# Patient Record
Sex: Male | Born: 1950
Health system: Southern US, Community
[De-identification: ages and names within clinical notes are randomized; demographics above are authoritative.]

## PROBLEM LIST (undated history)

## (undated) DIAGNOSIS — E1165 Type 2 diabetes mellitus with hyperglycemia: Secondary | ICD-10-CM

## (undated) DIAGNOSIS — E119 Type 2 diabetes mellitus without complications: Secondary | ICD-10-CM

## (undated) DIAGNOSIS — F32A Depression, unspecified: Secondary | ICD-10-CM

## (undated) DIAGNOSIS — G250 Essential tremor: Secondary | ICD-10-CM

## (undated) DIAGNOSIS — K219 Gastro-esophageal reflux disease without esophagitis: Secondary | ICD-10-CM

## (undated) DIAGNOSIS — I2510 Atherosclerotic heart disease of native coronary artery without angina pectoris: Secondary | ICD-10-CM

## (undated) DIAGNOSIS — E669 Obesity, unspecified: Secondary | ICD-10-CM

## (undated) DIAGNOSIS — F329 Major depressive disorder, single episode, unspecified: Secondary | ICD-10-CM

## (undated) DIAGNOSIS — E039 Hypothyroidism, unspecified: Secondary | ICD-10-CM

## (undated) DIAGNOSIS — I10 Essential (primary) hypertension: Secondary | ICD-10-CM

## (undated) DIAGNOSIS — E079 Disorder of thyroid, unspecified: Secondary | ICD-10-CM

## (undated) HISTORY — PX: TONSILLECTOMY: SUR1361

## (undated) HISTORY — DX: Major depressive disorder, single episode, unspecified: F32.9

## (undated) HISTORY — DX: Obesity, unspecified: E66.9

## (undated) HISTORY — DX: Gastro-esophageal reflux disease without esophagitis: K21.9

## (undated) HISTORY — DX: Essential tremor: G25.0

## (undated) HISTORY — DX: Depression, unspecified: F32.A

## (undated) HISTORY — DX: Hypothyroidism, unspecified: E03.9

## (undated) HISTORY — DX: Type 2 diabetes mellitus with hyperglycemia: E11.65

## (undated) HISTORY — PX: COLONOSCOPY: SHX174

## (undated) HISTORY — DX: Atherosclerotic heart disease of native coronary artery without angina pectoris: I25.10

## (undated) HISTORY — PX: APPENDECTOMY: SHX54

---

## 2010-12-04 ENCOUNTER — Inpatient Hospital Stay (INDEPENDENT_AMBULATORY_CARE_PROVIDER_SITE_OTHER)
Admission: RE | Admit: 2010-12-04 | Discharge: 2010-12-04 | Disposition: A | Discharge status: Home / Self Care | Payer: 59 | Source: Ambulatory Visit | Attending: Family Medicine | Admitting: Family Medicine

## 2010-12-04 DIAGNOSIS — E876 Hypokalemia: Secondary | ICD-10-CM

## 2010-12-04 LAB — POCT I-STAT, CHEM 8
BUN: 14 mg/dL (ref 6–23)
Chloride: 95 mEq/L — ABNORMAL LOW (ref 96–112)
Creatinine, Ser: 1.5 mg/dL (ref 0.4–1.5)
Glucose, Bld: 131 mg/dL — ABNORMAL HIGH (ref 70–99)
Potassium: 3 mEq/L — ABNORMAL LOW (ref 3.5–5.1)
Sodium: 134 mEq/L — ABNORMAL LOW (ref 135–145)

## 2012-08-22 ENCOUNTER — Encounter (INDEPENDENT_AMBULATORY_CARE_PROVIDER_SITE_OTHER): Payer: Self-pay | Admitting: *Deleted

## 2014-10-19 ENCOUNTER — Encounter: Payer: Self-pay | Admitting: Pulmonary Disease

## 2015-08-16 MED FILL — JANUVIA 100 MG TABLET: 100 | 90 days supply | Qty: 90 | Fill #3

## 2015-09-16 DIAGNOSIS — I10 Essential (primary) hypertension: Secondary | ICD-10-CM | POA: Diagnosis not present

## 2015-09-16 DIAGNOSIS — M545 Low back pain: Secondary | ICD-10-CM | POA: Diagnosis not present

## 2015-09-16 DIAGNOSIS — E119 Type 2 diabetes mellitus without complications: Secondary | ICD-10-CM | POA: Diagnosis not present

## 2015-09-21 MED FILL — LEVOTHYROXINE 112 MCG TAB: 112 | 90 days supply | Qty: 90 | Fill #1

## 2015-09-21 MED FILL — LISINOPRIL-HCTZ 20-12.5 MG: 20-12.5 | 90 days supply | Qty: 180 | Fill #1

## 2015-09-21 MED FILL — metFORMIN HCL 500 MG TABS: 500 | 90 days supply | Qty: 180 | Fill #1

## 2015-09-21 MED FILL — PARoxetine HCL 20 MG TABS: 20 | 90 days supply | Qty: 90 | Fill #1

## 2015-10-25 DIAGNOSIS — Z1211 Encounter for screening for malignant neoplasm of colon: Secondary | ICD-10-CM | POA: Diagnosis not present

## 2015-10-25 DIAGNOSIS — M545 Low back pain: Secondary | ICD-10-CM | POA: Diagnosis not present

## 2015-10-25 DIAGNOSIS — E119 Type 2 diabetes mellitus without complications: Secondary | ICD-10-CM | POA: Diagnosis not present

## 2015-10-25 DIAGNOSIS — Z0000 Encounter for general adult medical examination without abnormal findings: Secondary | ICD-10-CM | POA: Diagnosis not present

## 2015-10-25 DIAGNOSIS — I10 Essential (primary) hypertension: Secondary | ICD-10-CM | POA: Diagnosis not present

## 2015-12-06 MED FILL — JANUMET 50-1,000 MG TABLET: 50-1000 | 90 days supply | Qty: 180 | Fill #0

## 2015-12-22 MED FILL — LISINOPRIL-HCTZ 20-12.5 MG: 20-12.5 | 90 days supply | Qty: 180 | Fill #2

## 2015-12-22 MED FILL — LEVOTHYROXINE 112 MCG TAB: 112 | 90 days supply | Qty: 90 | Fill #2

## 2015-12-22 MED FILL — PARoxetine HCL 20 MG TABS: 20 | 90 days supply | Qty: 90 | Fill #2

## 2016-02-21 DIAGNOSIS — F329 Major depressive disorder, single episode, unspecified: Secondary | ICD-10-CM | POA: Diagnosis not present

## 2016-02-21 DIAGNOSIS — E1165 Type 2 diabetes mellitus with hyperglycemia: Secondary | ICD-10-CM | POA: Diagnosis not present

## 2016-02-21 DIAGNOSIS — E039 Hypothyroidism, unspecified: Secondary | ICD-10-CM | POA: Diagnosis not present

## 2016-02-21 DIAGNOSIS — I10 Essential (primary) hypertension: Secondary | ICD-10-CM | POA: Diagnosis not present

## 2016-02-22 DIAGNOSIS — E1165 Type 2 diabetes mellitus with hyperglycemia: Secondary | ICD-10-CM | POA: Diagnosis not present

## 2016-02-22 DIAGNOSIS — E039 Hypothyroidism, unspecified: Secondary | ICD-10-CM | POA: Diagnosis not present

## 2016-02-22 DIAGNOSIS — I10 Essential (primary) hypertension: Secondary | ICD-10-CM | POA: Diagnosis not present

## 2016-02-22 DIAGNOSIS — F329 Major depressive disorder, single episode, unspecified: Secondary | ICD-10-CM | POA: Diagnosis not present

## 2016-03-08 MED FILL — JANUMET 50-1,000 MG TABLET: 50-1000 | 90 days supply | Qty: 180 | Fill #1

## 2016-03-28 MED FILL — PARoxetine HCL 20 MG TABS: 20 | 90 days supply | Qty: 90 | Fill #3

## 2016-03-28 MED FILL — LEVOTHYROXINE 112 MCG TAB: 112 | 90 days supply | Qty: 90 | Fill #3

## 2016-03-28 MED FILL — LISINOPRIL-HCTZ 20-12.5 MG: 20-12.5 | 90 days supply | Qty: 180 | Fill #3

## 2016-05-12 ENCOUNTER — Telehealth (INDEPENDENT_AMBULATORY_CARE_PROVIDER_SITE_OTHER): Payer: Self-pay | Admitting: Internal Medicine

## 2016-05-12 MED ORDER — AZITHROMYCIN 250 MG PO TABS: ORAL_TABLET | ORAL | 0 refills | Status: DC

## 2016-05-12 NOTE — Telephone Encounter (Signed)
Rx for Z pack sent to his pharmacy

## 2016-05-17 DIAGNOSIS — J329 Chronic sinusitis, unspecified: Secondary | ICD-10-CM | POA: Diagnosis not present

## 2016-05-17 NOTE — Telephone Encounter (Signed)
error 

## 2016-05-23 DIAGNOSIS — Z23 Encounter for immunization: Secondary | ICD-10-CM | POA: Diagnosis not present

## 2016-05-23 DIAGNOSIS — E1165 Type 2 diabetes mellitus with hyperglycemia: Secondary | ICD-10-CM | POA: Diagnosis not present

## 2016-05-23 DIAGNOSIS — I10 Essential (primary) hypertension: Secondary | ICD-10-CM | POA: Diagnosis not present

## 2016-05-23 DIAGNOSIS — F321 Major depressive disorder, single episode, moderate: Secondary | ICD-10-CM | POA: Diagnosis not present

## 2016-06-06 MED FILL — JANUMET 50-1,000 MG TABLET: 50-1000 | 90 days supply | Qty: 180 | Fill #2

## 2016-06-08 MED FILL — TRUEplus LANCETS 30G MISC: 90 days supply | Qty: 300 | Fill #0

## 2016-06-28 MED FILL — LISINOPRIL-HCTZ 20-12.5 MG: 20-12.5 | 90 days supply | Qty: 180 | Fill #0

## 2016-06-28 MED FILL — TRUE METRIX GLUCOSE TEST ST: 90 days supply | Qty: 300 | Fill #0

## 2016-06-28 MED FILL — LEVOTHYROXINE 112 MCG TAB: 112 | 90 days supply | Qty: 90 | Fill #0

## 2016-06-28 MED FILL — PARoxetine HCL 20 MG TABS: 20 | 90 days supply | Qty: 90 | Fill #0

## 2016-07-04 ENCOUNTER — Encounter: Payer: Self-pay | Admitting: Pulmonary Disease

## 2016-07-04 DIAGNOSIS — E1165 Type 2 diabetes mellitus with hyperglycemia: Secondary | ICD-10-CM | POA: Diagnosis not present

## 2016-07-04 DIAGNOSIS — F321 Major depressive disorder, single episode, moderate: Secondary | ICD-10-CM | POA: Diagnosis not present

## 2016-07-04 DIAGNOSIS — I10 Essential (primary) hypertension: Secondary | ICD-10-CM | POA: Diagnosis not present

## 2016-09-14 MED FILL — JANUMET 50-1,000 MG TABLET: 50-1000 | 30 days supply | Qty: 60 | Fill #3

## 2016-09-26 MED FILL — LISINOPRIL-HCTZ 20-12.5 MG: 20-12.5 | 90 days supply | Qty: 180 | Fill #1

## 2016-09-26 MED FILL — PARoxetine HCL 20 MG TABS: 20 | 90 days supply | Qty: 90 | Fill #1

## 2016-09-26 MED FILL — LEVOTHYROXINE 112 MCG TAB: 112 | 90 days supply | Qty: 90 | Fill #1

## 2016-10-16 MED FILL — JANUMET 50-1,000 MG TABLET: 50-1000 | 30 days supply | Qty: 60 | Fill #4

## 2016-11-13 MED FILL — JANUMET 50-1,000 MG TABLET: 50-1000 | 30 days supply | Qty: 60 | Fill #0

## 2016-11-15 MED FILL — ACCU-CHEK FASTCLIX LANCETS: 90 days supply | Qty: 204 | Fill #0

## 2016-11-15 MED FILL — ACCU-CHEK GUIDE TEST STRIP: 90 days supply | Qty: 200 | Fill #0

## 2016-11-22 DIAGNOSIS — H5213 Myopia, bilateral: Secondary | ICD-10-CM | POA: Diagnosis not present

## 2016-11-22 DIAGNOSIS — H524 Presbyopia: Secondary | ICD-10-CM | POA: Diagnosis not present

## 2016-11-22 DIAGNOSIS — H52203 Unspecified astigmatism, bilateral: Secondary | ICD-10-CM | POA: Diagnosis not present

## 2016-11-22 DIAGNOSIS — E119 Type 2 diabetes mellitus without complications: Secondary | ICD-10-CM | POA: Diagnosis not present

## 2016-12-15 MED FILL — JANUMET 50-1,000 MG TABLET: 50-1000 | 30 days supply | Qty: 60 | Fill #1

## 2016-12-21 MED FILL — LEVOTHYROXINE 112 MCG TAB: 112 | 90 days supply | Qty: 90 | Fill #2

## 2016-12-21 MED FILL — PARoxetine HCL 20 MG TABS: 20 | 90 days supply | Qty: 90 | Fill #2

## 2016-12-21 MED FILL — LISINOPRIL-HCTZ 20-12.5 MG: 20-12.5 | 90 days supply | Qty: 180 | Fill #2

## 2017-01-16 MED FILL — JANUMET 50-1,000 MG TABLET: 50-1000 | 30 days supply | Qty: 60 | Fill #2

## 2017-02-13 MED FILL — JANUMET 50-1,000 MG TABLET: 50-1000 | 30 days supply | Qty: 60 | Fill #3

## 2017-03-21 MED FILL — JANUMET 50-1,000 MG TABLET: 50-1000 | 30 days supply | Qty: 60 | Fill #4

## 2017-03-27 MED FILL — LEVOTHYROXINE 112 MCG TAB: 112 | 90 days supply | Qty: 90 | Fill #3

## 2017-03-27 MED FILL — LISINOPRIL-HCTZ 20-12.5 MG: 20-12.5 | 90 days supply | Qty: 180 | Fill #3

## 2017-03-27 MED FILL — PARoxetine HCL 20 MG TABS: 20 | 90 days supply | Qty: 90 | Fill #3

## 2017-04-19 MED FILL — JANUMET 50-1,000 MG TABLET: 50-1000 | 30 days supply | Qty: 60 | Fill #5

## 2017-05-23 MED FILL — JANUMET 50-1,000 MG TABLET: 50-1000 | 30 days supply | Qty: 60 | Fill #6

## 2017-06-19 MED FILL — LEVOTHYROXINE 112 MCG TAB: 112 | 90 days supply | Qty: 90 | Fill #0

## 2017-06-19 MED FILL — LISINOPRIL-HCTZ 20-12.5 MG: 20-12.5 | 90 days supply | Qty: 180 | Fill #0

## 2017-06-19 MED FILL — PARoxetine HCL 20 MG TABS: 20 | 90 days supply | Qty: 90 | Fill #0

## 2017-06-27 MED FILL — JANUMET 50-1,000 MG TABLET: 50-1000 | 30 days supply | Qty: 60 | Fill #7

## 2017-07-20 DIAGNOSIS — Z23 Encounter for immunization: Secondary | ICD-10-CM | POA: Diagnosis not present

## 2017-07-20 DIAGNOSIS — F321 Major depressive disorder, single episode, moderate: Secondary | ICD-10-CM | POA: Diagnosis not present

## 2017-07-20 DIAGNOSIS — E119 Type 2 diabetes mellitus without complications: Secondary | ICD-10-CM | POA: Diagnosis not present

## 2017-07-20 DIAGNOSIS — E669 Obesity, unspecified: Secondary | ICD-10-CM | POA: Diagnosis not present

## 2017-07-20 DIAGNOSIS — I10 Essential (primary) hypertension: Secondary | ICD-10-CM | POA: Diagnosis not present

## 2017-07-25 MED FILL — JANUMET 50-1,000 MG TABLET: 50-1000 | 30 days supply | Qty: 60 | Fill #8

## 2017-08-28 MED FILL — JANUMET 50-1,000 MG TABLET: 50-1000 | 30 days supply | Qty: 60 | Fill #9

## 2017-09-16 ENCOUNTER — Emergency Department (HOSPITAL_COMMUNITY): Payer: 59

## 2017-09-16 ENCOUNTER — Encounter (HOSPITAL_COMMUNITY): Payer: Self-pay | Admitting: Emergency Medicine

## 2017-09-16 ENCOUNTER — Observation Stay (HOSPITAL_COMMUNITY)
Admission: EM | Admit: 2017-09-16 | Discharge: 2017-09-18 | Disposition: A | Discharge status: Home / Self Care | Payer: 59 | Attending: Internal Medicine | Admitting: Internal Medicine

## 2017-09-16 DIAGNOSIS — T1590XA Foreign body on external eye, part unspecified, unspecified eye, initial encounter: Secondary | ICD-10-CM

## 2017-09-16 DIAGNOSIS — Z7984 Long term (current) use of oral hypoglycemic drugs: Secondary | ICD-10-CM | POA: Diagnosis not present

## 2017-09-16 DIAGNOSIS — E1122 Type 2 diabetes mellitus with diabetic chronic kidney disease: Secondary | ICD-10-CM | POA: Diagnosis not present

## 2017-09-16 DIAGNOSIS — E119 Type 2 diabetes mellitus without complications: Secondary | ICD-10-CM | POA: Diagnosis present

## 2017-09-16 DIAGNOSIS — E669 Obesity, unspecified: Secondary | ICD-10-CM

## 2017-09-16 DIAGNOSIS — E1169 Type 2 diabetes mellitus with other specified complication: Secondary | ICD-10-CM | POA: Diagnosis present

## 2017-09-16 DIAGNOSIS — E785 Hyperlipidemia, unspecified: Secondary | ICD-10-CM | POA: Insufficient documentation

## 2017-09-16 DIAGNOSIS — I10 Essential (primary) hypertension: Secondary | ICD-10-CM | POA: Diagnosis not present

## 2017-09-16 DIAGNOSIS — Z6830 Body mass index (BMI) 30.0-30.9, adult: Secondary | ICD-10-CM | POA: Diagnosis not present

## 2017-09-16 DIAGNOSIS — Z8249 Family history of ischemic heart disease and other diseases of the circulatory system: Secondary | ICD-10-CM | POA: Insufficient documentation

## 2017-09-16 DIAGNOSIS — Z7982 Long term (current) use of aspirin: Secondary | ICD-10-CM | POA: Insufficient documentation

## 2017-09-16 DIAGNOSIS — R200 Anesthesia of skin: Secondary | ICD-10-CM | POA: Diagnosis not present

## 2017-09-16 DIAGNOSIS — R079 Chest pain, unspecified: Secondary | ICD-10-CM | POA: Diagnosis not present

## 2017-09-16 DIAGNOSIS — E039 Hypothyroidism, unspecified: Secondary | ICD-10-CM | POA: Insufficient documentation

## 2017-09-16 DIAGNOSIS — I129 Hypertensive chronic kidney disease with stage 1 through stage 4 chronic kidney disease, or unspecified chronic kidney disease: Secondary | ICD-10-CM | POA: Insufficient documentation

## 2017-09-16 DIAGNOSIS — Z79899 Other long term (current) drug therapy: Secondary | ICD-10-CM | POA: Insufficient documentation

## 2017-09-16 DIAGNOSIS — N182 Chronic kidney disease, stage 2 (mild): Secondary | ICD-10-CM | POA: Insufficient documentation

## 2017-09-16 DIAGNOSIS — Z79890 Hormone replacement therapy (postmenopausal): Secondary | ICD-10-CM | POA: Diagnosis not present

## 2017-09-16 DIAGNOSIS — E1165 Type 2 diabetes mellitus with hyperglycemia: Secondary | ICD-10-CM | POA: Diagnosis present

## 2017-09-16 DIAGNOSIS — R0789 Other chest pain: Secondary | ICD-10-CM | POA: Diagnosis not present

## 2017-09-16 HISTORY — DX: Type 2 diabetes mellitus without complications: E11.9

## 2017-09-16 HISTORY — DX: Essential (primary) hypertension: I10

## 2017-09-16 HISTORY — DX: Disorder of thyroid, unspecified: E07.9

## 2017-09-16 LAB — I-STAT TROPONIN, ED: TROPONIN I, POC: 0 ng/mL (ref 0.00–0.08)

## 2017-09-16 LAB — CBC
HEMATOCRIT: 43 % (ref 39.0–52.0)
HEMOGLOBIN: 15.1 g/dL (ref 13.0–17.0)
MCH: 29.8 pg (ref 26.0–34.0)
MCHC: 35.1 g/dL (ref 30.0–36.0)
MCV: 85 fL (ref 78.0–100.0)
Platelets: 218 10*3/uL (ref 150–400)
RBC: 5.06 MIL/uL (ref 4.22–5.81)
RDW: 13.2 % (ref 11.5–15.5)
WBC: 8.9 10*3/uL (ref 4.0–10.5)

## 2017-09-16 LAB — BASIC METABOLIC PANEL
ANION GAP: 11 (ref 5–15)
BUN: 19 mg/dL (ref 6–20)
CO2: 22 mmol/L (ref 22–32)
Calcium: 9.1 mg/dL (ref 8.9–10.3)
Chloride: 101 mmol/L (ref 101–111)
Creatinine, Ser: 1.31 mg/dL — ABNORMAL HIGH (ref 0.61–1.24)
GFR calc non Af Amer: 56 mL/min — ABNORMAL LOW (ref >60)
GLUCOSE: 180 mg/dL — AB (ref 65–99)
Potassium: 3.6 mmol/L (ref 3.5–5.1)
Sodium: 134 mmol/L — ABNORMAL LOW (ref 135–145)

## 2017-09-16 MED ORDER — LISINOPRIL 20 MG PO TABS
20.0000 mg | ORAL_TABLET | Freq: Every day | ORAL | Status: DC
  Filled 2017-09-16: qty 1

## 2017-09-16 MED ORDER — LISINOPRIL-HYDROCHLOROTHIAZIDE 20-12.5 MG PO TABS: 1.0000 | ORAL_TABLET | Freq: Every day | ORAL | Status: DC

## 2017-09-16 MED ORDER — ASPIRIN 81 MG PO CHEW
324.0000 mg | CHEWABLE_TABLET | Freq: Once | ORAL | Status: AC
  Administered 2017-09-16: 324 mg via ORAL
  Filled 2017-09-16: qty 4

## 2017-09-16 MED ORDER — HYDROCHLOROTHIAZIDE 12.5 MG PO CAPS
12.5000 mg | ORAL_CAPSULE | Freq: Every day | ORAL | Status: DC
Start: 2017-09-17 — End: 2017-09-17

## 2017-09-16 MED ORDER — INSULIN ASPART 100 UNIT/ML ~~LOC~~ SOLN
0.0000 [IU] | Freq: Three times a day (TID) | SUBCUTANEOUS | Status: DC
  Administered 2017-09-17: 2 [IU] via SUBCUTANEOUS

## 2017-09-16 MED ORDER — ENOXAPARIN SODIUM 40 MG/0.4ML ~~LOC~~ SOLN
40.0000 mg | SUBCUTANEOUS | Status: DC
  Administered 2017-09-17 – 2017-09-18 (×2): 40 mg via SUBCUTANEOUS
  Filled 2017-09-16 (×2): qty 0.4

## 2017-09-16 MED ORDER — NITROGLYCERIN 0.4 MG SL SUBL: 0.4000 mg | SUBLINGUAL_TABLET | SUBLINGUAL | Status: DC | PRN

## 2017-09-16 MED ORDER — LEVOTHYROXINE SODIUM 112 MCG PO TABS
112.0000 ug | ORAL_TABLET | Freq: Every day | ORAL | Status: DC
  Administered 2017-09-17 – 2017-09-18 (×2): 112 ug via ORAL
  Filled 2017-09-16 (×3): qty 1

## 2017-09-16 MED ORDER — ASPIRIN EC 81 MG PO TBEC: 81.0000 mg | DELAYED_RELEASE_TABLET | Freq: Every day | ORAL | Status: DC

## 2017-09-16 MED ORDER — ASPIRIN EC 81 MG PO TBEC
81.0000 mg | DELAYED_RELEASE_TABLET | Freq: Every evening | ORAL | Status: DC
  Administered 2017-09-17 – 2017-09-18 (×2): 81 mg via ORAL
  Filled 2017-09-16 (×2): qty 1

## 2017-09-16 MED ORDER — ACETAMINOPHEN 650 MG RE SUPP: 650.0000 mg | Freq: Four times a day (QID) | RECTAL | Status: DC | PRN

## 2017-09-16 MED ORDER — ONDANSETRON HCL 4 MG PO TABS: 4.0000 mg | ORAL_TABLET | Freq: Four times a day (QID) | ORAL | Status: DC | PRN

## 2017-09-16 MED ORDER — ONDANSETRON HCL 4 MG/2ML IJ SOLN
4.0000 mg | Freq: Four times a day (QID) | INTRAMUSCULAR | Status: DC | PRN
  Administered 2017-09-18: 4 mg via INTRAVENOUS
  Filled 2017-09-16: qty 2

## 2017-09-16 MED ORDER — PAROXETINE HCL 20 MG PO TABS
20.0000 mg | ORAL_TABLET | Freq: Every day | ORAL | Status: DC
  Administered 2017-09-17 – 2017-09-18 (×2): 20 mg via ORAL
  Filled 2017-09-16 (×2): qty 1

## 2017-09-16 MED ORDER — ACETAMINOPHEN 325 MG PO TABS
650.0000 mg | ORAL_TABLET | Freq: Four times a day (QID) | ORAL | Status: DC | PRN
  Administered 2017-09-17 – 2017-09-18 (×3): 650 mg via ORAL
  Filled 2017-09-16 (×3): qty 2

## 2017-09-16 NOTE — H&P (Addendum)
History and Physical    LAMONTA Salazar VQQ:[id number] DOB: 19-Mar-1951 DOA: 09/16/2017  PCP: Sinda Du, MD  Patient coming from: Home.  Chief Complaint: Left upper extremity numbness and chest pain.  HPI: Jim Salazar is a 67 y.o. male with history of hypertension, diabetes mellitus type 2, hypothyroidism at around 4 PM last evening while on the computer started developing left upper extremity numbness which started off from the fingers going up towards his neck and his left jaw and involve the left tongue.  The whole symptoms lasted for around 45 minutes.  Did not have any weakness of the extremities or any difficulty speaking or swallowing or any facial droop.  He did not have any visual symptoms.  Concerned with the symptoms patient came to the ER.  ED Course: In the ER while walking patient has right-sided chest pain was pressure-like which improved on sitting down.  He did not have any shortness of breath or diaphoresis palpitations nausea vomiting.  EKG showed normal sinus rhythm with incomplete right bundle branch block.  Troponins were negative chest x-ray was unremarkable patient was admitted for further management of chest pain and also possible TIA.  Review of Systems: As per HPI, rest all negative.   Past Medical History:  Diagnosis Date  . Diabetes mellitus without complication ([place])   . Hypertension   . Thyroid disease     Past Surgical History:  Procedure Laterality Date  . APPENDECTOMY    . TONSILLECTOMY       reports that  has never smoked. he has never used smokeless tobacco. He reports that he does not drink alcohol or use drugs.  No Known Allergies  Family History  Problem Relation Age of Onset  . Hypertension Other     Prior to Admission medications   Medication Sig Start Date End Date Taking? Authorizing Provider  aspirin EC 81 MG tablet Take 81 mg by mouth every evening.   Yes [provider]  JANUMET 50-1000 MG tablet Take 1 tablet  by mouth 2 (two) times daily. 08/28/17  Yes [provider]  levothyroxine (SYNTHROID, LEVOTHROID) 112 MCG tablet Take 112 mcg by mouth daily. 09/26/16  Yes [provider]  lisinopril-hydrochlorothiazide (PRINZIDE,ZESTORETIC) 20-12.5 MG tablet Take 1 tablet by mouth daily. 06/19/17  Yes [provider]  PARoxetine (PAXIL) 20 MG tablet Take 20 mg by mouth daily. 06/19/17  Yes [provider]  azithromycin (ZITHROMAX Z-PAK) 250 MG tablet Take 2 tabs today and then 1 tab daily x 4 days Patient not taking: Reported on 09/16/2017 05/12/16   Butch Penny, NP    Physical Exam: Vitals:   09/16/17 2015 09/16/17 2030 09/16/17 2200 09/16/17 2215  BP: (!) 184/96 (!) 193/96 (!) 198/90 (!) 171/95  Pulse: 70 63 69 70  Resp: 16 17 14 18   Temp:      TempSrc:      SpO2: 100% 98% 97% 98%  Weight:      Height:          Constitutional: Moderately built and nourished. Vitals:   09/16/17 2015 09/16/17 2030 09/16/17 2200 09/16/17 2215  BP: (!) 184/96 (!) 193/96 (!) 198/90 (!) 171/95  Pulse: 70 63 69 70  Resp: 16 17 14 18   Temp:      TempSrc:      SpO2: 100% 98% 97% 98%  Weight:      Height:       Eyes: Anicteric no pallor. ENMT: No discharge from the  ears eyes nose or mouth. Neck: No mass felt.  No neck rigidity. Respiratory: No rhonchi or crepitations. Cardiovascular: S1-S2 heard no murmurs appreciated. Abdomen: Soft nontender bowel sounds present. Musculoskeletal: No edema.  No joint effusion. Skin: No rash.  Skin appears warm. Neurologic: Alert awake oriented to time place and person.  Moves all extremities 5 x 5.  No facial asymmetry tongue is midline.  Pupils are equal and reacting to light. Psychiatric: Appears normal.  Normal affect.   Labs on Admission: I have personally reviewed following labs and imaging studies  CBC: Recent Labs  Lab 09/16/17 1724  WBC 8.9  HGB 15.1  HCT 43.0  MCV 85.0  PLT 694   Basic Metabolic Panel: Recent Labs    Lab 09/16/17 1724  NA 134*  K 3.6  CL 101  CO2 22  GLUCOSE 180*  BUN 19  CREATININE 1.31*  CALCIUM 9.1   GFR: Estimated Creatinine Clearance: 69.5 mL/min (A) (by C-G formula based on SCr of 1.31 mg/dL (H)). Liver Function Tests: No results for input(s): AST, ALT, ALKPHOS, BILITOT, PROT, ALBUMIN in the last 168 hours. No results for input(s): LIPASE, AMYLASE in the last 168 hours. No results for input(s): AMMONIA in the last 168 hours. Coagulation Profile: No results for input(s): INR, PROTIME in the last 168 hours. Cardiac Enzymes: No results for input(s): CKTOTAL, CKMB, CKMBINDEX, TROPONINI in the last 168 hours. BNP (last 3 results) No results for input(s): PROBNP in the last 8760 hours. HbA1C: No results for input(s): HGBA1C in the last 72 hours. CBG: No results for input(s): GLUCAP in the last 168 hours. Lipid Profile: No results for input(s): CHOL, HDL, LDLCALC, TRIG, CHOLHDL, LDLDIRECT in the last 72 hours. Thyroid Function Tests: No results for input(s): TSH, T4TOTAL, FREET4, T3FREE, THYROIDAB in the last 72 hours. Anemia Panel: No results for input(s): VITAMINB12, FOLATE, FERRITIN, TIBC, IRON, RETICCTPCT in the last 72 hours. Urine analysis: No results found for: COLORURINE, APPEARANCEUR, LABSPEC, PHURINE, GLUCOSEU, HGBUR, BILIRUBINUR, KETONESUR, PROTEINUR, UROBILINOGEN, NITRITE, LEUKOCYTESUR Sepsis Labs: @LABRCNTIP (procalcitonin:4,lacticidven:4) )No results found for this or any previous visit (from the past 240 hour(s)).   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 Pt states that his symptoms have improved but states when he was walking into to the hospital he started having left sided chest pain as well. pt states left sided neck stiffness x the last 3 nights per pt.  no prior chest hx per pt. EXAM: CHEST  2 VIEW COMPARISON:  None. FINDINGS: Cardiac silhouette is normal size and configuration. No mediastinal or hilar masses. There is no evidence of adenopathy. Lungs are clear. No pleural effusion or pneumothorax. Skeletal structures are intact. IMPRESSION: No active cardiopulmonary disease. Electronically Signed   By: Lajean Manes M.D.   On: 09/16/2017 18:29    EKG: Independently reviewed.  Normal sinus rhythm with incomplete right bundle branch block.  Assessment/Plan Principal Problem:   Chest pain Active Problems:   Essential hypertension   Diabetes mellitus type 2 in obese ([place])    1. Chest pain -given the exertional symptoms and chest pain improving on taking rest we will cycle cardiac markers to rule out ACS.  Patient is on aspirin.  Check 2D echo, d-dimer.  Cardiology consult in a.m. 2. Left-sided numbness concerning for TIA patient symptoms  have resolved at this time.  Lasted for 45 minutes.  Discussed with neurologist Dr. Leonel Ramsay.  Advised to get MRI brain with MRA of the brain and neck.  Neurochecks.  Patient passed swallow evaluation.  Patient will be on aspirin.  Check hemoglobin A1c lipid panel. 3. Hypertension uncontrolled -patient has not taken his home medication lisinopril hydrochlorothiazide today.  Will order that.  We will allow for permissive hypertension until stroke ruled out.  PRN IV hydralazine for systolic blood pressure more than 210. 4. Chronic kidney disease stage II -creatinine appears to be at baseline when compared to 2012.  Follow metabolic panel patient is on lisinopril and hydrochlorothiazide, may need to hold if worsening of creatinine. 5. Hypothyroidism on Synthroid.   DVT prophylaxis: Lovenox. Code Status: Full code. Family Communication: Patient's wife. Disposition Plan: Home. Consults called: Discussed with neurologist. Admission status: Observation.   Rise Patience MD [facility] Pager [phone].  If 7PM-7AM, please contact night-coverage [email or web address] Password [facility]  09/16/2017, 11:51 PM

## 2017-09-16 NOTE — ED Triage Notes (Signed)
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 Pt states that his symptoms have improved but states when he was walking into to the hospital he started having left sided chest pain as well.

## 2017-09-16 NOTE — ED Provider Notes (Signed)
Developed left-sided hand numbness which crept approximately to his left arm, left face and left tongue 4:15 PM today lasting approximately 15 minutes mainly followed by right-sided anterior chest pain which occurred when he was walking.  Chest pain lasted 5 approximately 5 minutes, and resolved.  Patient presently asymptomatic without treatment.  On exam alert Glasgow Coma Score 15 motor strength 5/5 overall cranial nerves II through XII grossly intact lungs clear to auscultation heart regular rate and rhythm no murmurs Heart score equals 4 based on EKG criteria story age and risk factors   Orlie Dakin, MD 09/16/17 2056

## 2017-09-16 NOTE — ED Provider Notes (Signed)
[facility] Provider Note   CSN: [id number] Arrival date & time: 09/16/17  1657     History   Chief Complaint Chief Complaint  Patient presents with  . Numbness  . Chest Pain    [name] is a 67 y.o. male.  Patient with a history T2DM, HTN, thyroid disease who presents with numbness that started in his left hand and traveled up his arm, into left neck and face and involving the left side of his tongue. Duration of episode was approximately 15 minutes. No headache, visual change, difficulty speaking or facial asymmetry. He has not had similar symptoms previously. After arrival to the hospital, he had right sided chest pain without SOB, nausea, that started while walking in from the parking lot. No history of heart disease. He is currently asymptomatic.    The history is provided by the patient and the spouse. No language interpreter was used.  Chest Pain   Associated symptoms include numbness. Pertinent negatives include no fever, no headaches and no weakness.    Past Medical History:  Diagnosis Date  . Diabetes mellitus without complication ([place])   . Hypertension   . Thyroid disease     There are no active problems to display for this patient.   Past Surgical History:  Procedure Laterality Date  . APPENDECTOMY    . TONSILLECTOMY         Home Medications    Prior to Admission medications   Medication Sig Start Date End Date Taking? Authorizing Provider  azithromycin (ZITHROMAX Z-PAK) 250 MG tablet Take 2 tabs today and then 1 tab daily x 4 days 05/12/16   Butch Penny, NP    Family History No family history on file.  Social History Social History   Tobacco Use  . Smoking status: Never Smoker  . Smokeless tobacco: Never Used  Substance Use Topics  . Alcohol use: No    Frequency: Never  . Drug use: No     Allergies   Patient has no known allergies.   Review of Systems Review of Systems    Constitutional: Negative for chills and fever.  HENT: Negative.   Respiratory: Negative.   Cardiovascular: Positive for chest pain.  Gastrointestinal: Negative.   Musculoskeletal: Negative.  Negative for neck pain.  Skin: Negative.   Neurological: Positive for numbness. Negative for facial asymmetry, speech difficulty, weakness, light-headedness and headaches.     Physical Exam Updated Vital Signs BP (!) 169/90   Pulse 65   Temp 97.9 F (36.6 C) (Oral)   Resp 20   Ht 6' (1.829 m)   Wt 102.1 kg (225 lb)   SpO2 99%   BMI 30.52 kg/m   Physical Exam  Constitutional: He is oriented to person, place, and time. He appears well-developed and well-nourished.  HENT:  Head: Normocephalic.  Neck: Normal range of motion. Neck supple.  Cardiovascular: Normal rate and regular rhythm.  Pulmonary/Chest: Effort normal and breath sounds normal.  Abdominal: Soft. Bowel sounds are normal. There is no tenderness. There is no rebound and no guarding.  Musculoskeletal: Normal range of motion.  Neurological: He is alert and oriented to person, place, and time.  CN's 3-12 grossly intact. Speech is clear and focused. No facial asymmetry. No lateralizing weakness. Reflexes are equal. No deficits of coordination.   Skin: Skin is warm and dry. No rash noted.  Psychiatric: He has a normal mood and affect.     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 Pt states that his symptoms have improved but states when he was walking into to the hospital he started having left sided chest pain as well. pt states left sided neck stiffness x the last 3 nights per pt. no prior chest hx per pt. EXAM: CHEST  2 VIEW COMPARISON:  None. FINDINGS: Cardiac silhouette is normal size and configuration. No mediastinal or hilar masses. There is no evidence of adenopathy. Lungs are clear. No pleural effusion or pneumothorax. Skeletal structures are intact. IMPRESSION: No active cardiopulmonary disease. Electronically Signed   By: Lajean Manes M.D.   On: 09/16/2017 18:29    Procedures Procedures (including critical care time)  Medications Ordered in ED Medications - No data to display   Initial Impression / Assessment and Plan / ED Course  I have reviewed the triage vital signs and the nursing notes.  Pertinent labs & imaging results that were available during my care of the patient were reviewed by me and considered in my medical decision making (see chart for details).     Patient presents to the emergency department for evaluation of episode of left UE numbness that involved his neck and jaw, with subsequent exertional chest pain. Heart Score of 4.   He is seen and evaluated by Dr. Winfred Leeds. Plan is to admit for further cardiac testing in evaluation of ischemia. EKG is without acute change. CXR clear. Initial troponin is negative. Patient and family updated on plan.   Final Clinical Impressions(s) / ED Diagnoses   Final diagnoses:  None   1. Chest pain 2 Left UE numbness  ED Discharge Orders    None       Dennie Bible 09/16/17 2147    Orlie Dakin, MD 09/17/17 [phone]

## 2017-09-17 ENCOUNTER — Encounter (HOSPITAL_COMMUNITY): Payer: Self-pay

## 2017-09-17 ENCOUNTER — Observation Stay (HOSPITAL_BASED_OUTPATIENT_CLINIC_OR_DEPARTMENT_OTHER): Payer: 59

## 2017-09-17 ENCOUNTER — Other Ambulatory Visit: Payer: Self-pay

## 2017-09-17 ENCOUNTER — Observation Stay (HOSPITAL_COMMUNITY): Payer: 59

## 2017-09-17 DIAGNOSIS — E785 Hyperlipidemia, unspecified: Secondary | ICD-10-CM | POA: Diagnosis not present

## 2017-09-17 DIAGNOSIS — I129 Hypertensive chronic kidney disease with stage 1 through stage 4 chronic kidney disease, or unspecified chronic kidney disease: Secondary | ICD-10-CM | POA: Diagnosis not present

## 2017-09-17 DIAGNOSIS — I10 Essential (primary) hypertension: Secondary | ICD-10-CM | POA: Diagnosis not present

## 2017-09-17 DIAGNOSIS — R200 Anesthesia of skin: Secondary | ICD-10-CM | POA: Diagnosis not present

## 2017-09-17 DIAGNOSIS — R079 Chest pain, unspecified: Secondary | ICD-10-CM | POA: Diagnosis not present

## 2017-09-17 DIAGNOSIS — Z7982 Long term (current) use of aspirin: Secondary | ICD-10-CM | POA: Diagnosis not present

## 2017-09-17 DIAGNOSIS — E669 Obesity, unspecified: Secondary | ICD-10-CM | POA: Diagnosis not present

## 2017-09-17 DIAGNOSIS — I340 Nonrheumatic mitral (valve) insufficiency: Secondary | ICD-10-CM

## 2017-09-17 DIAGNOSIS — E039 Hypothyroidism, unspecified: Secondary | ICD-10-CM | POA: Diagnosis not present

## 2017-09-17 DIAGNOSIS — E1169 Type 2 diabetes mellitus with other specified complication: Secondary | ICD-10-CM | POA: Diagnosis not present

## 2017-09-17 DIAGNOSIS — Z135 Encounter for screening for eye and ear disorders: Secondary | ICD-10-CM | POA: Diagnosis not present

## 2017-09-17 DIAGNOSIS — I639 Cerebral infarction, unspecified: Secondary | ICD-10-CM | POA: Diagnosis not present

## 2017-09-17 DIAGNOSIS — E1122 Type 2 diabetes mellitus with diabetic chronic kidney disease: Secondary | ICD-10-CM | POA: Diagnosis not present

## 2017-09-17 DIAGNOSIS — N182 Chronic kidney disease, stage 2 (mild): Secondary | ICD-10-CM | POA: Diagnosis not present

## 2017-09-17 DIAGNOSIS — Z79890 Hormone replacement therapy (postmenopausal): Secondary | ICD-10-CM | POA: Diagnosis not present

## 2017-09-17 HISTORY — DX: Anesthesia of skin: R20.0

## 2017-09-17 LAB — HEPATIC FUNCTION PANEL
ALBUMIN: 3.7 g/dL (ref 3.5–5.0)
ALK PHOS: 60 U/L (ref 38–126)
ALT: 16 U/L — AB (ref 17–63)
AST: 25 U/L (ref 15–41)
BILIRUBIN INDIRECT: 1.2 mg/dL — AB (ref 0.3–0.9)
BILIRUBIN TOTAL: 1.4 mg/dL — AB (ref 0.3–1.2)
Bilirubin, Direct: 0.2 mg/dL (ref 0.1–0.5)
Total Protein: 6.3 g/dL — ABNORMAL LOW (ref 6.5–8.1)

## 2017-09-17 LAB — BASIC METABOLIC PANEL
ANION GAP: 12 (ref 5–15)
BUN: 14 mg/dL (ref 6–20)
CALCIUM: 9.1 mg/dL (ref 8.9–10.3)
CO2: 24 mmol/L (ref 22–32)
Chloride: 100 mmol/L — ABNORMAL LOW (ref 101–111)
Creatinine, Ser: 1.04 mg/dL (ref 0.61–1.24)
GFR calc Af Amer: >60 mL/min (ref >60)
GFR calc non Af Amer: >60 mL/min (ref >60)
GLUCOSE: 275 mg/dL — AB (ref 65–99)
Potassium: 3.6 mmol/L (ref 3.5–5.1)
Sodium: 136 mmol/L (ref 135–145)

## 2017-09-17 LAB — LIPID PANEL
CHOLESTEROL: 146 mg/dL (ref 0–200)
HDL: 39 mg/dL — ABNORMAL LOW (ref >40)
LDL CALC: 82 mg/dL (ref 0–99)
TRIGLYCERIDES: 126 mg/dL (ref <150)
Total CHOL/HDL Ratio: 3.7 RATIO
VLDL: 25 mg/dL (ref 0–40)

## 2017-09-17 LAB — CBC
HCT: 41.8 % (ref 39.0–52.0)
HCT: 42.9 % (ref 39.0–52.0)
HEMOGLOBIN: 14.6 g/dL (ref 13.0–17.0)
Hemoglobin: 14.5 g/dL (ref 13.0–17.0)
MCH: 29.1 pg (ref 26.0–34.0)
MCH: 29.6 pg (ref 26.0–34.0)
MCHC: 34 g/dL (ref 30.0–36.0)
MCHC: 34.7 g/dL (ref 30.0–36.0)
MCV: 85.3 fL (ref 78.0–100.0)
MCV: 85.5 fL (ref 78.0–100.0)
PLATELETS: 202 10*3/uL (ref 150–400)
Platelets: 202 10*3/uL (ref 150–400)
RBC: 4.9 MIL/uL (ref 4.22–5.81)
RBC: 5.02 MIL/uL (ref 4.22–5.81)
RDW: 13.2 % (ref 11.5–15.5)
RDW: 13.3 % (ref 11.5–15.5)
WBC: 7 10*3/uL (ref 4.0–10.5)
WBC: 9.5 10*3/uL (ref 4.0–10.5)

## 2017-09-17 LAB — TSH: TSH: 2.643 u[IU]/mL (ref 0.350–4.500)

## 2017-09-17 LAB — CREATININE, SERUM
Creatinine, Ser: 1.16 mg/dL (ref 0.61–1.24)
GFR calc non Af Amer: >60 mL/min (ref >60)

## 2017-09-17 LAB — ECHOCARDIOGRAM COMPLETE
EERAT: 7.41
FS: 37 % (ref 28–44)
HEIGHTINCHES: 72 in
IV/PV OW: 0.83
LA diam end sys: 42 mm
LA vol A4C: 58.5 ml
LA vol: 70.1 mL
LADIAMINDEX: 1.88 cm/m2
LASIZE: 42 mm
LAVOLIN: 31.3 mL/m2
LV E/e' medial: 7.41
LVEEAVG: 7.41
LVELAT: 8.38 cm/s
LVOT SV: 100 mL
LVOT VTI: 20.4 cm
LVOT area: 4.91 cm2
LVOT peak grad rest: 4 mmHg
LVOT peak vel: 93.8 cm/s
LVOTD: 25 mm
Lateral S' vel: 12.7 cm/s
MV pk E vel: 62.1 m/s
MVPKAVEL: 73.2 m/s
PW: 12 mm — AB (ref 0.6–1.1)
TAPSE: 26.7 mm
TDI e' lateral: 8.38
TDI e' medial: 5.66
WEIGHTICAEL: 3587.2 [oz_av]

## 2017-09-17 LAB — GLUCOSE, CAPILLARY
GLUCOSE-CAPILLARY: 168 mg/dL — AB (ref 65–99)
Glucose-Capillary: 141 mg/dL — ABNORMAL HIGH (ref 65–99)
Glucose-Capillary: 187 mg/dL — ABNORMAL HIGH (ref 65–99)
Glucose-Capillary: 172 mg/dL — ABNORMAL HIGH (ref 65–99)
Glucose-Capillary: 204 mg/dL — ABNORMAL HIGH (ref 65–99)

## 2017-09-17 LAB — TROPONIN I
Troponin I: <0.03 ng/mL (ref <0.03)
Troponin I: <0.03 ng/mL (ref <0.03)
Troponin I: <0.03 ng/mL (ref <0.03)

## 2017-09-17 LAB — HIV ANTIBODY (ROUTINE TESTING W REFLEX): HIV Screen 4th Generation wRfx: NONREACTIVE

## 2017-09-17 LAB — D-DIMER, QUANTITATIVE: D-Dimer, Quant: <0.27 ug/mL-FEU (ref 0.00–0.50)

## 2017-09-17 MED ORDER — LORAZEPAM 2 MG/ML IJ SOLN
0.2500 mg | Freq: Once | INTRAMUSCULAR | Status: AC
  Administered 2017-09-17: 0.25 mg via INTRAVENOUS
  Filled 2017-09-17: qty 1

## 2017-09-17 MED ORDER — AMLODIPINE BESYLATE 5 MG PO TABS
5.0000 mg | ORAL_TABLET | Freq: Every day | ORAL | Status: DC
  Administered 2017-09-17: 5 mg via ORAL
  Filled 2017-09-17 (×2): qty 1

## 2017-09-17 MED ORDER — STROKE: EARLY STAGES OF RECOVERY BOOK
Freq: Once | Status: AC
  Administered 2017-09-17: 02:00:00
  Filled 2017-09-17: qty 1

## 2017-09-17 MED ORDER — METFORMIN HCL 500 MG PO TABS
1000.0000 mg | ORAL_TABLET | Freq: Two times a day (BID) | ORAL | Status: DC
  Administered 2017-09-17 – 2017-09-18 (×3): 1000 mg via ORAL
  Filled 2017-09-17 (×3): qty 2

## 2017-09-17 MED ORDER — LINAGLIPTIN 5 MG PO TABS
5.0000 mg | ORAL_TABLET | Freq: Every day | ORAL | Status: DC
  Administered 2017-09-17 – 2017-09-18 (×2): 5 mg via ORAL
  Filled 2017-09-17 (×2): qty 1

## 2017-09-17 MED ORDER — LISINOPRIL 20 MG PO TABS
20.0000 mg | ORAL_TABLET | Freq: Every day | ORAL | Status: DC
  Administered 2017-09-17 – 2017-09-18 (×3): 20 mg via ORAL
  Filled 2017-09-17 (×2): qty 1

## 2017-09-17 MED ORDER — HYDRALAZINE HCL 20 MG/ML IJ SOLN
10.0000 mg | INTRAMUSCULAR | Status: DC | PRN
  Filled 2017-09-17: qty 1

## 2017-09-17 MED ORDER — HYDROCHLOROTHIAZIDE 12.5 MG PO CAPS
12.5000 mg | ORAL_CAPSULE | Freq: Every day | ORAL | Status: DC
  Administered 2017-09-17 – 2017-09-18 (×2): 12.5 mg via ORAL
  Filled 2017-09-17 (×2): qty 1

## 2017-09-17 MED ORDER — SITAGLIPTIN PHOS-METFORMIN HCL 50-1000 MG PO TABS: 1.0000 | ORAL_TABLET | Freq: Two times a day (BID) | ORAL | Status: DC

## 2017-09-17 NOTE — Evaluation (Signed)
Physical Therapy Evaluation Patient Details Name: Jim Salazar MRN: [id number] DOB: 29-Jun-1951 Today's Date: 09/17/2017   History of Present Illness  NAHMIR ZEIDMAN is a 67 y.o. male with history of hypertension, diabetes mellitus type 2, hypothyroidism at around 4 PM last evening while on the computer started developing left upper extremity numbness which started off from the fingers going up towards his neck and his left jaw and involve the left tongue.  The whole symptoms lasted for around 45 minutes.  Pt also had episode of chest pain upon arrival to ED, trop neg, EKG NSR with incomplete RBBB. He later had second episode of stroke like symptoms in ED, MRI neg for acute findings.    Clinical Impression  Patient evaluated by Physical Therapy with no further acute PT needs identified. All education has been completed and the patient has no further questions. Pt independent with mobility, scored 51/56 on Berg balance indicating low fall risk. Discussed risk factors and healthy lifestyle including being more intentional about exercise.  See below for any follow-up Physical Therapy or equipment needs. PT is signing off. Thank you for this referral.     Follow Up Recommendations No PT follow up    Equipment Recommendations  None recommended by PT    Recommendations for Other Services       Precautions / Restrictions Precautions Precautions: None Restrictions Weight Bearing Restrictions: No      Mobility  Bed Mobility Overal bed mobility: Independent                Transfers Overall transfer level: Independent                  Ambulation/Gait Ambulation/Gait assistance: Independent Ambulation Distance (Feet): 400 Feet Assistive device: None Gait Pattern/deviations: WFL(Within Functional Limits) Gait velocity: WFL Gait velocity interpretation: at or above normal speed for age/gender General Gait Details: pt ambulated with normal gait pattern and  speed  Stairs Stairs: Yes Stairs assistance: Modified independent (Device/Increase time) Stair Management: One rail Right;Alternating pattern;Forwards Number of Stairs: 10 General stair comments: no SOB on 1 flight stairs, no CP  Wheelchair Mobility    Modified Rankin (Stroke Patients Only)       Balance Overall balance assessment: Independent                               Standardized Balance Assessment Standardized Balance Assessment : Berg Balance Test Berg Balance Test Sit to Stand: Able to stand without using hands and stabilize independently Standing Unsupported: Able to stand safely 2 minutes Sitting with Back Unsupported but Feet Supported on Floor or Stool: Able to sit safely and securely 2 minutes Stand to Sit: Sits safely with minimal use of hands Transfers: Able to transfer safely, minor use of hands Standing Unsupported with Eyes Closed: Able to stand 10 seconds safely Standing Ubsupported with Feet Together: Able to place feet together independently and stand 1 minute safely From Standing, Reach Forward with Outstretched Arm: Can reach confidently >25 cm (10") From Standing Position, Pick up Object from Floor: Able to pick up shoe safely and easily From Standing Position, Turn to Look Behind Over each Shoulder: Looks behind from both sides and weight shifts well Turn 360 Degrees: Able to turn 360 degrees safely in 4 seconds or less Standing Unsupported, Alternately Place Feet on Step/Stool: Able to stand independently and complete 8 steps >20 seconds Standing Unsupported, One Foot in Front: Able to  plae foot ahead of the other independently and hold 30 seconds Standing on One Leg: Able to lift leg independently and hold equal to or more than 3 seconds Total Score: 52         Pertinent Vitals/Pain Pain Assessment: No/denies pain    Home Living Family/patient expects to be discharged to:: Private residence Living Arrangements: Spouse/significant  other Available Help at Discharge: Family;Available PRN/intermittently Type of Home: House Home Access: Stairs to enter Entrance Stairs-Rails: Right;Left;Can reach both Entrance Stairs-Number of Steps: 3 Home Layout: One level Home Equipment: None      Prior Function Level of Independence: Independent               Hand Dominance        Extremity/Trunk Assessment   Upper Extremity Assessment Upper Extremity Assessment: Overall WFL for tasks assessed    Lower Extremity Assessment Lower Extremity Assessment: Overall WFL for tasks assessed    Cervical / Trunk Assessment Cervical / Trunk Assessment: Normal  Communication   Communication: No difficulties  Cognition Arousal/Alertness: Awake/alert Behavior During Therapy: WFL for tasks assessed/performed Overall Cognitive Status: Within Functional Limits for tasks assessed                                        General Comments General comments (skin integrity, edema, etc.): discussed stroke sxs in case he had similar symptoms in the future. Discussed sedentary lifestyle and ways to be more active.     Exercises     Assessment/Plan    PT Assessment Patent does not need any further PT services  PT Problem List         PT Treatment Interventions      PT Goals (Current goals can be found in the Care Plan section)  Acute Rehab PT Goals Patient Stated Goal: return home PT Goal Formulation: All assessment and education complete, DC therapy    Frequency     Barriers to discharge        Co-evaluation               AM-PAC PT "6 Clicks" Daily Activity  Outcome Measure Difficulty turning over in bed (including adjusting bedclothes, sheets and blankets)?: None Difficulty moving from lying on back to sitting on the side of the bed? : None Difficulty sitting down on and standing up from a chair with arms (e.g., wheelchair, bedside commode, etc,.)?: None Help needed moving to and from a bed  to chair (including a wheelchair)?: None Help needed walking in hospital room?: None Help needed climbing 3-5 steps with a railing? : None 6 Click Score: 24    End of Session   Activity Tolerance: Patient tolerated treatment well Patient left: in bed;with call bell/phone within reach;with family/visitor present Nurse Communication: Mobility status PT Visit Diagnosis: Unsteadiness on feet (R26.81)    Time: 7062-3762 PT Time Calculation (min) (ACUTE ONLY): 21 min   Charges:   PT Evaluation $PT Eval Moderate Complexity: 1 Mod     PT G Codes:        Leighton Roach, PT  Acute Rehab Services  [place] 09/17/2017, 1:03 PM

## 2017-09-17 NOTE — Progress Notes (Addendum)
Pt has expressed nervousness in closed spaces has never had a MRI or any sedatives. MD ordering sedative.

## 2017-09-17 NOTE — Significant Event (Addendum)
Rapid Response Event Note  Overview: Neurologic. RN called about patient needing stroke assessment.   Initial Focused Assessment: I arrived to assess patient for stroke like symptoms. When I arrived, patient was alert and oriented, patient was able to follow all commands, A/O=4, NIH =0, no acute sensory or motor loss, speech intact, and visual assessment was normal. LSN 1600 on 2/24  Per patient on 2/24 at 1615, patient had an acute onset of left sided numbness and tingling which started in the left hand, it came up his whole left arm, into the left side of neck/left side of his face and the left side of his tongue. Per patient, this episode lasted about 15 minutes. Patient's wife brought the patient to the ED at [facility] and while he was getting out of the car, he had an onset of substernal chest pain.   VSS and currently patient is chest pain free.  Interventions: -- stroke orders placed -- MRI STAT -- NIH and SS screen done  Plan of Care (if not transferred): -- patient will need neuro tele bed when one becomes available.   Event Summary:  Call Time 137 Arrival Time  140 End Time 225  Victorya Hillman R

## 2017-09-17 NOTE — Progress Notes (Signed)
OT Cancellation Note  Patient Details Name: Jim Salazar MRN: [id number] DOB: [birth year]   Cancelled Treatment:    Reason Eval/Treat Not Completed: OT screened, no needs identified, will sign off. Spoke with PT who reports no OT needs identified.  Almon Register [phone] 09/17/2017, 1:18 PM

## 2017-09-17 NOTE — Progress Notes (Signed)
PROGRESS NOTE    Jim Salazar  UQJ:[id number] DOB: October 27, 1950 DOA: 09/16/2017 PCP: Sinda Du, MD   Outpatient Specialists:     Brief Narrative:   Jim Salazar is a 67 y.o. male with history of hypertension, diabetes mellitus type 2, hypothyroidism at around 4 PM last evening while on the computer started developing left upper extremity numbness which started off from the fingers going up towards his neck and his left jaw and involve the left tongue.  The whole symptoms lasted for around 45 minutes.  Did not have any weakness of the extremities or any difficulty speaking or swallowing or any facial droop.  He did not have any visual symptoms.  Concerned with the symptoms patient came to the ER.     Assessment & Plan:   Principal Problem:   Chest pain Active Problems:   Essential hypertension   Diabetes mellitus type 2 in obese (HCC)   Left sided numbness   Chest pain  -CE negative -echo pending -cardiology consult appreciated  Left-sided numbness concerning for TIA patient symptoms have resolved at this time.  Lasted for 45 minutes.  Dr. Raliegh Ip Discussed with neurologist Dr. Leonel Ramsay.  -ASA -FLP: 80 -HgbA1c: pending -outpatient neuro follow up  Hypertension  -resume home meds -start norvasc for better control  Chronic kidney disease stage II  -monitor  Hypothyroidism -Synthroid -TSH      DVT prophylaxis:  Lovenox   Code Status: Full Code   Family Communication:   Disposition Plan:     Consultants:  Neuro (phone) cards  Subjective: Multiple complaints  Objective: Vitals:   09/17/17 0400 09/17/17 0923 09/17/17 0924 09/17/17 1155  BP: (!) 194/90 (!) 162/85 (!) 162/85 (!) 171/92  Pulse:  74  72  Resp:    18  Temp:    97.8 F (36.6 C)  TempSrc:    Oral  SpO2:    98%  Weight:      Height:        Intake/Output Summary (Last 24 hours) at 09/17/2017 1454 Last data filed at 09/17/2017 1040 Gross per 24 hour  Intake 240 ml    Output 400 ml  Net -160 ml   Filed Weights   09/16/17 1706 09/17/17 0055  Weight: 102.1 kg (225 lb) 101.7 kg (224 lb 3.2 oz)    Examination:  General exam: Appears calm and comfortable  Respiratory system: Clear to auscultation. Respiratory effort normal. Cardiovascular system: S1 & S2 heard, RRR. No JVD, murmurs, rubs, gallops or clicks. No pedal edema. Gastrointestinal system: Abdomen is nondistended, soft and nontender. No organomegaly or masses felt. Normal bowel sounds heard. Central nervous system: Alert and oriented. Fine tremor Extremities: Symmetric 5 x 5 power. Skin: No rashes, lesions or ulcers Psychiatry: Judgement and insight appear normal. Mood & affect appropriate.     Data Reviewed: I have personally reviewed following labs and imaging studies  CBC: Recent Labs  Lab 09/16/17 1724 09/17/17 0013 09/17/17 0843  WBC 8.9 9.5 7.0  HGB 15.1 14.5 14.6  HCT 43.0 41.8 42.9  MCV 85.0 85.3 85.5  PLT 218 202 389   Basic Metabolic Panel: Recent Labs  Lab 09/16/17 1724 09/17/17 0013 09/17/17 0843  NA 134*  --  136  K 3.6  --  3.6  CL 101  --  100*  CO2 22  --  24  GLUCOSE 180*  --  275*  BUN 19  --  14  CREATININE 1.31* 1.16 1.04  CALCIUM 9.1  --  9.1   GFR: Estimated Creatinine Clearance: 87.3 mL/min (by C-G formula based on SCr of 1.04 mg/dL). Liver Function Tests: Recent Labs  Lab 09/17/17 1154  AST 25  ALT 16*  ALKPHOS 60  BILITOT 1.4*  PROT 6.3*  ALBUMIN 3.7   No results for input(s): LIPASE, AMYLASE in the last 168 hours. No results for input(s): AMMONIA in the last 168 hours. Coagulation Profile: No results for input(s): INR, PROTIME in the last 168 hours. Cardiac Enzymes: Recent Labs  Lab 09/17/17 0013 09/17/17 0843 09/17/17 1154  TROPONINI <0.03 <0.03 <0.03   BNP (last 3 results) No results for input(s): PROBNP in the last 8760 hours. HbA1C: No results for input(s): HGBA1C in the last 72 hours. CBG: Recent Labs  Lab  09/17/17 0110 09/17/17 0752 09/17/17 1157  GLUCAP 141* 168* 187*   Lipid Profile: Recent Labs    09/17/17 0843  CHOL 146  HDL 39*  LDLCALC 82  TRIG 126  CHOLHDL 3.7   Thyroid Function Tests: Recent Labs    09/17/17 1154  TSH 2.643   Anemia Panel: No results for input(s): VITAMINB12, FOLATE, FERRITIN, TIBC, IRON, RETICCTPCT in the last 72 hours. Urine analysis: No results found for: COLORURINE, APPEARANCEUR, LABSPEC, Weaver, GLUCOSEU, HGBUR, BILIRUBINUR, KETONESUR, PROTEINUR, UROBILINOGEN, NITRITE, LEUKOCYTESUR   )No results found for this or any previous visit (from the past 240 hour(s)).    Anti-infectives (From admission, onward)   None       Radiology Studies: Dg Eye Foreign Body  Result Date: 09/17/2017 CLINICAL DATA:  Metal working/exposure; clearance prior to MRI EXAM: ORBITS FOR FOREIGN BODY - 2 VIEW COMPARISON:  None. FINDINGS: There is no evidence of metallic foreign body within the orbits. No significant bone abnormality identified. Dental fillings noted. IMPRESSION: No evidence of metallic foreign body within the orbits. Electronically Signed   By: Anner Crete M.D.   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 Pt states that his symptoms have improved but states when he was walking into to the hospital he started having left sided chest pain as well. pt states left sided neck stiffness x the last 3 nights per pt. no prior chest hx per pt. EXAM: CHEST  2 VIEW COMPARISON:  None. FINDINGS: Cardiac silhouette is normal size and configuration. No mediastinal or hilar masses. There is no evidence of adenopathy. Lungs are clear. No pleural effusion or pneumothorax. Skeletal structures are intact. IMPRESSION: No active cardiopulmonary disease. Electronically Signed   By: Lajean Manes M.D.   On: 09/16/2017 18:29   Mr Jodene Nam Neck Wo Contrast  Result Date: 09/17/2017 CLINICAL DATA:  Stroke follow-up. Left upper extremity numbness and chest pain. EXAM: MRI HEAD WITHOUT CONTRAST MRA HEAD WITHOUT CONTRAST MRA NECK WITHOUT CONTRAST TECHNIQUE: Multiplanar, multiecho pulse sequences of the brain and surrounding structures were obtained without intravenous contrast. Angiographic images of the Circle of Willis were obtained using MRA technique without intravenous contrast. Angiographic images of the neck were obtained using MRA technique without intravenous contrast. Carotid stenosis measurements (when applicable) are obtained utilizing NASCET criteria, using the distal internal carotid diameter as the denominator. COMPARISON:  None. FINDINGS: MRI HEAD FINDINGS Brain: No acute infarction, hemorrhage, hydrocephalus, extra-axial collection or mass lesion. Mild central volume loss and lateral ventriculomegaly. Mild FLAIR hyperintensity in the  cerebral white matter, usually old microvascular insults given patient's history. Vascular: Arterial findings below. Normal dural venous sinus flow voids. Skull and upper cervical spine: Negative Sinuses/Orbits: Retention cysts in the inferior left maxillary sinus. MRA HEAD FINDINGS Symmetric carotid arteries. Left dominant vertebral artery. Fetal type right PCA. There is mild narrowing of the mid basilar with no evidence of intramural hematoma on conventional brain MR. Mild irregularity of left PCA branches. These findings are likely atherosclerotic. No proximal or reversible flow limiting stenosis. No major branch occlusion. MRA NECK FINDINGS The arch could not be covered on the time-of-flight MRA. There is no visible stenosis, beading (when accounting for artifact at the skull base) or ulceration. Left dominant vertebral artery. IMPRESSION: Brain MRI: 1. No acute finding. 2. Minimal white matter disease, likely remote microvascular ischemia. Intracranial MRA:  Atheromatous type narrowing of the mid basilar that is mild. Atheromatous change also likely present in distal left PCA branches. Neck MRA: Negative. Electronically Signed   By: Monte Fantasia M.D.   On: 09/17/2017 07:36   Mr Brain Wo Contrast  Result Date: 09/17/2017 CLINICAL DATA:  Stroke follow-up. Left upper extremity numbness and chest pain. EXAM: MRI HEAD WITHOUT CONTRAST MRA HEAD WITHOUT CONTRAST MRA NECK WITHOUT CONTRAST TECHNIQUE: Multiplanar, multiecho pulse sequences of the brain and surrounding structures were obtained without intravenous contrast. Angiographic images of the Circle of Willis were obtained using MRA technique without intravenous contrast. Angiographic images of the neck were obtained using MRA technique without intravenous contrast. Carotid stenosis measurements (when applicable) are obtained utilizing NASCET criteria, using the distal internal carotid diameter as the denominator. COMPARISON:  None. FINDINGS: MRI HEAD FINDINGS Brain: No acute infarction, hemorrhage, hydrocephalus, extra-axial collection or mass lesion. Mild central volume loss and lateral ventriculomegaly. Mild FLAIR hyperintensity in the cerebral white matter, usually old microvascular insults given patient's history. Vascular: Arterial findings below. Normal dural venous sinus flow voids. Skull and upper cervical spine: Negative Sinuses/Orbits: Retention cysts in the inferior left maxillary sinus. MRA HEAD FINDINGS Symmetric carotid arteries. Left dominant vertebral artery. Fetal type right PCA. There is mild narrowing of the mid basilar with no evidence of intramural hematoma on conventional brain MR. Mild irregularity of left PCA branches. These findings are likely atherosclerotic. No proximal or reversible flow limiting stenosis. No major branch occlusion. MRA NECK FINDINGS The arch could not be covered on the time-of-flight MRA. There is no visible stenosis, beading (when accounting for artifact at the skull  base) or ulceration. Left dominant vertebral artery. IMPRESSION: Brain MRI: 1. No acute finding. 2. Minimal white matter disease, likely remote microvascular ischemia. Intracranial MRA: Atheromatous type narrowing of the mid basilar that is mild. Atheromatous change also likely present in distal left PCA branches. Neck MRA: Negative. Electronically Signed   By: Monte Fantasia M.D.   On: 09/17/2017 07:36   Mr Jodene Nam Head Wo Contrast  Result Date: 09/17/2017 CLINICAL DATA:  Stroke follow-up. Left upper extremity numbness and chest pain. EXAM: MRI HEAD WITHOUT CONTRAST MRA HEAD WITHOUT CONTRAST MRA NECK WITHOUT CONTRAST TECHNIQUE: Multiplanar, multiecho pulse sequences of the brain and surrounding structures were obtained without intravenous contrast. Angiographic images of the Circle of Willis were obtained using MRA technique without intravenous contrast. Angiographic images of the neck were obtained using MRA technique without intravenous contrast. Carotid stenosis measurements (when applicable) are obtained utilizing NASCET criteria, using the distal internal carotid diameter as the denominator. COMPARISON:  None. FINDINGS: MRI HEAD FINDINGS Brain: No acute infarction, hemorrhage, hydrocephalus, extra-axial collection or mass  lesion. Mild central volume loss and lateral ventriculomegaly. Mild FLAIR hyperintensity in the cerebral white matter, usually old microvascular insults given patient's history. Vascular: Arterial findings below. Normal dural venous sinus flow voids. Skull and upper cervical spine: Negative Sinuses/Orbits: Retention cysts in the inferior left maxillary sinus. MRA HEAD FINDINGS Symmetric carotid arteries. Left dominant vertebral artery. Fetal type right PCA. There is mild narrowing of the mid basilar with no evidence of intramural hematoma on conventional brain MR. Mild irregularity of left PCA branches. These findings are likely atherosclerotic. No proximal or reversible flow limiting  stenosis. No major branch occlusion. MRA NECK FINDINGS The arch could not be covered on the time-of-flight MRA. There is no visible stenosis, beading (when accounting for artifact at the skull base) or ulceration. Left dominant vertebral artery. IMPRESSION: Brain MRI: 1. No acute finding. 2. Minimal white matter disease, likely remote microvascular ischemia. Intracranial MRA: Atheromatous type narrowing of the mid basilar that is mild. Atheromatous change also likely present in distal left PCA branches. Neck MRA: Negative. Electronically Signed   By: Monte Fantasia M.D.   On: 09/17/2017 07:36        Scheduled Meds: . amLODipine  5 mg Oral Daily  . aspirin EC  81 mg Oral QPM  . enoxaparin (LOVENOX) injection  40 mg Subcutaneous Q24H  . hydrochlorothiazide  12.5 mg Oral Daily   And  . lisinopril  20 mg Oral Daily  . levothyroxine  112 mcg Oral QAC breakfast  . linagliptin  5 mg Oral Daily  . metFORMIN  1,000 mg Oral BID WC  . PARoxetine  20 mg Oral Daily   Continuous Infusions:   LOS: 0 days    Time spent: 35 min    Geradine Girt, DO [facility] Pager [phone]  If 7PM-7AM, please contact night-coverage [email or web address] Password [facility] 09/17/2017, 2:54 PM

## 2017-09-17 NOTE — Progress Notes (Signed)
Patient is alert and oriented ambulatory, Admitting MD rounding on patient New orders for Stroke screening Rapid Response called.

## 2017-09-17 NOTE — Progress Notes (Signed)
  Echocardiogram 2D Echocardiogram has been performed.  Jim Salazar 09/17/2017, 3:30 PM

## 2017-09-17 NOTE — Progress Notes (Addendum)
Pt is stable except BP running in a higher side (MD aware, adjusting medicine, parameter does not meet fdor IV hydralazine). Pt ambulated in a hallway couple of times this evening, aware of stress test tomorrow am and NPO from midnight, resting well, will continue to monitor the patient  Palma Holter, RN

## 2017-09-17 NOTE — Progress Notes (Signed)
SLP Cancellation Note  Patient Details Name: MATTIA LIFORD MRN: [id number] DOB: 12-06-1950   Cancelled treatment:       Reason Eval/Treat Not Completed: SLP screened, no needs identified, will sign off  Jannette Spanner MA, CCC-SLP Acute Care Speech Language Pathologist    Arlissa Monteverde E Alya Smaltz 09/17/2017, 1:39 PM

## 2017-09-17 NOTE — Progress Notes (Signed)
Per protocol patient has to have as 2 view orbital xray to rule out metal in the eye from exposure to metal 30 years ago.

## 2017-09-17 NOTE — Plan of Care (Signed)
  Education: Knowledge of General Education information will improve 09/17/2017 [phone] - Progressing by Rolm Baptise, RN   Health Behavior/Discharge Planning: Ability to manage health-related needs will improve 09/17/2017 3267 - Progressing by Rolm Baptise, RN   Clinical Measurements: Ability to maintain clinical measurements within normal limits will improve 09/17/2017 1245 - Progressing by Rolm Baptise, RN Will remain free from infection 09/17/2017 8099 - Progressing by Rolm Baptise, RN Diagnostic test results will improve 09/17/2017 [phone] - Progressing by Rolm Baptise, RN Respiratory complications will improve 09/17/2017 2505 - Progressing by Rolm Baptise, RN Cardiovascular complication will be avoided 09/17/2017 3976 - Progressing by Rolm Baptise, RN   Clinical Measurements: Will remain free from infection 09/17/2017 7341 - Progressing by Rolm Baptise, RN   Clinical Measurements: Diagnostic test results will improve 09/17/2017 9379 - Progressing by Rolm Baptise, RN   Clinical Measurements: Diagnostic test results will improve 09/17/2017 0240 - Progressing by Rolm Baptise, RN   Clinical Measurements: Respiratory complications will improve 09/17/2017 9735 - Progressing by Rolm Baptise, RN   Activity: Risk for activity intolerance will decrease 09/17/2017 3299 - Progressing by Rolm Baptise, RN   Nutrition: Adequate nutrition will be maintained 09/17/2017 2426 - Progressing by Rolm Baptise, RN   Nutrition: Adequate nutrition will be maintained 09/17/2017 8341 - Progressing by Rolm Baptise, RN   Coping: Level of anxiety will decrease 09/17/2017 9622 - Progressing by Rolm Baptise, RN   Elimination: Will not experience complications related to bowel motility 09/17/2017 2979 - Progressing by Rolm Baptise, RN Will not experience complications related to urinary retention 09/17/2017 8921 - Progressing by Rolm Baptise, RN   Pain  Managment: General experience of comfort will improve 09/17/2017 [phone] - Progressing by Rolm Baptise, RN   Safety: Ability to remain free from injury will improve 09/17/2017 7408 - Progressing by Rolm Baptise, RN   Skin Integrity: Risk for impaired skin integrity will decrease 09/17/2017 1448 - Progressing by Rolm Baptise, RN   Skin Integrity: Risk for impaired skin integrity will decrease 09/17/2017 1856 - Progressing by Rolm Baptise, RN

## 2017-09-17 NOTE — Consult Note (Signed)
Cardiology Consultation:   Patient ID: Jim Salazar; [id number]; 1951/03/04   Admit date: 09/16/2017 Date of Consult: 09/17/2017  Primary Care Provider: Sinda Du, MD Primary Cardiologist: [facility]  Patient Profile:   Jim Salazar is a 67 y.o. male with a hx of HTN, DMII, and hypothyroidism who is being seen today for the evaluation of left upper extremity numbness and chest pain at the request of Dr. Hal Hope.   History of Present Illness:   Mr. Jim Salazar is a 67 year old male with a history of hypertension, diabetes type 2, and hypothyroidism who presented to the emergency department on 09/16/17 after a 15-minute episode of left upper extremity numbness which started in his fingertips and worked its way up to his neck, left jaw, and tongue. During this episode, he reports no weakness of either upper or lower extremities, difficulty speaking or swallowing and reports no facial droop. No reports of visual changes, dizziness or syncope. Concerned about his symptoms, he proceeded to [facility].   While in the emergency department, he began experiencing abrupt onset, non-radiating, right-sided chest pressure. He reports that the pain began while walking into the ED and subsided on its own after sitting. He denies associated symptoms such shortness of breath, diaphoresis, vomiting and no reports of dizziness or syncope.  He reports mild nausea with the pressure. He has not had a recurrence of another episode.  An EKG was performed which showed normal sinus rhythm with no ST-T wave changes. Troponin levels were drawn which were negative x 3.  A chest x-ray was completed which shows no acute cardiopulmonary disease. There is concern for a possible TIA and/or some concern for ACS given exertional chest pressure which subsided with rest.  He works as a [employer] park manager and denies having exertional CP in the past. EDP curb sided neurology who recommended a follow-up head MRI which showed no acute infarct with  remote, mild microvascular ischemia.  An echocardiogram was ordered with pending results. He denies any family history of MI or CAD.  He has never experienced an episode like this in the past and is currently CP free.   Of note, in speaking to the pt and family regarding his recent episode, they report that over the past month or so, the patient will break out into a cold sweat with no other symptoms. He denies CP at this time and reports that he checks his BS which is always within a normal range. Additionally, he states that over the same amount of time, he will occasionally feel some upper back pain that radiates to his neck. Although vague in nature, the accumulation of these symptoms may certainly be from a cardiac etiology and worth further ACS investigation.   Past Medical History:  Diagnosis Date  . Diabetes mellitus without complication ([place])   . Hypertension   . Thyroid disease     Past Surgical History:  Procedure Laterality Date  . APPENDECTOMY    . TONSILLECTOMY       Prior to Admission medications   Medication Sig Start Date End Date Taking? Authorizing Provider  aspirin EC 81 MG tablet Take 81 mg by mouth every evening.   Yes [provider]  JANUMET 50-1000 MG tablet Take 1 tablet by mouth 2 (two) times daily. 08/28/17  Yes [provider]  levothyroxine (SYNTHROID, LEVOTHROID) 112 MCG tablet Take 112 mcg by mouth daily. 09/26/16  Yes [provider]  lisinopril-hydrochlorothiazide (PRINZIDE,ZESTORETIC) 20-12.5 MG tablet Take 1 tablet by mouth daily.  06/19/17  Yes [provider]  PARoxetine (PAXIL) 20 MG tablet Take 20 mg by mouth daily. 06/19/17  Yes [provider]  azithromycin (ZITHROMAX Z-PAK) 250 MG tablet Take 2 tabs today and then 1 tab daily x 4 days Patient not taking: Reported on 09/16/2017 05/12/16   Butch Penny, NP    Inpatient Medications: Scheduled Meds: . aspirin EC  81 mg Oral QPM  . enoxaparin (LOVENOX)  injection  40 mg Subcutaneous Q24H  . hydrochlorothiazide  12.5 mg Oral Daily   And  . lisinopril  20 mg Oral Daily  . insulin aspart  0-9 Units Subcutaneous TID WC  . levothyroxine  112 mcg Oral QAC breakfast  . PARoxetine  20 mg Oral Daily   Continuous Infusions:  PRN Meds: acetaminophen **OR** acetaminophen, hydrALAZINE, nitroGLYCERIN, ondansetron **OR** ondansetron (ZOFRAN) IV  Allergies:   No Known Allergies  Social History:   Social History   Socioeconomic History  . Marital status: Married    Spouse name: Not on file  . Number of children: Not on file  . Years of education: Not on file  . Highest education level: Not on file  Social Needs  . Financial resource strain: Not on file  . Food insecurity - worry: Not on file  . Food insecurity - inability: Not on file  . Transportation needs - medical: Not on file  . Transportation needs - non-medical: Not on file  Occupational History  . Not on file  Tobacco Use  . Smoking status: Never Smoker  . Smokeless tobacco: Never Used  Substance and Sexual Activity  . Alcohol use: No    Frequency: Never  . Drug use: No  . Sexual activity: Not on file  Other Topics Concern  . Not on file  Social History Narrative  . Not on file    Family History:   Family History  Problem Relation Age of Onset  . Hypertension Other   . Diabetes Mellitus II Mother   . Diabetes Mellitus II Father    Family Status:  Family Status  Relation Name Status  . Other  (Not Specified)  . Mother  (Not Specified)  . Father  (Not Specified)    ROS:  Please see the history of present illness.  All other ROS reviewed and negative.     Physical Exam/Data:   Vitals:   09/17/17 0228 09/17/17 0400 09/17/17 0923 09/17/17 0924  BP: (!) 182/94 (!) 194/90 (!) 162/85 (!) 162/85  Pulse:   74   Resp:      Temp:      TempSrc:      SpO2: 96%     Weight:      Height:        Intake/Output Summary (Last 24 hours) at 09/17/2017 1121 Last data  filed at 09/17/2017 1040 Gross per 24 hour  Intake 240 ml  Output 400 ml  Net -160 ml   Filed Weights   09/16/17 1706 09/17/17 0055  Weight: 225 lb (102.1 kg) 224 lb 3.2 oz (101.7 kg)   Body mass index is 30.41 kg/m.   General: Well developed, well nourished, NAD Skin: Warm, dry, intact  Head: Normocephalic, atraumatic, clear, moist mucus membranes. Neck: Negative for carotid bruits. No JVD Lungs:Clear to ausculation bilaterally. No wheezes, rales, or rhonchi. Breathing is unlabored. Cardiovascular: RRR with S1 S2. No murmurs, rubs, or gallops Abdomen: Soft, non-tender, non-distended with normoactive bowel sounds. No obvious abdominal masses. MSK: Strength and tone appear normal for  age. 5/5 in all extremities Extremities: No edema. No clubbing or cyanosis. DP/PT pulses 2+ bilaterally Neuro: Alert and oriented. No focal deficits. No facial asymmetry. MAE spontaneously. Psych: Responds to questions appropriately with normal affect.     EKG:  The EKG was personally reviewed and demonstrates: 09/16/17 NSR with RBBB Telemetry:  Telemetry was personally reviewed and demonstrates:  09/17/17 NSR HR 73  Relevant CV Studies:  ECHO: 09/17/17 Pending  CATH: NA  Laboratory Data:  Chemistry Recent Labs  Lab 09/16/17 1724 09/17/17 0013 09/17/17 0843  NA 134*  --  136  K 3.6  --  3.6  CL 101  --  100*  CO2 22  --  24  GLUCOSE 180*  --  275*  BUN 19  --  14  CREATININE 1.31* 1.16 1.04  CALCIUM 9.1  --  9.1  GFRNONAA 56* >60 >60  GFRAA >60 >60 >60  ANIONGAP 11  --  12    No results found for: PROT, ALBUMIN, AST, ALT, ALKPHOS, BILITOT Hematology Recent Labs  Lab 09/16/17 1724 09/17/17 0013 09/17/17 0843  WBC 8.9 9.5 7.0  RBC 5.06 4.90 5.02  HGB 15.1 14.5 14.6  HCT 43.0 41.8 42.9  MCV 85.0 85.3 85.5  MCH 29.8 29.6 29.1  MCHC 35.1 34.7 34.0  RDW 13.2 13.2 13.3  PLT 218 202 202   Cardiac Enzymes Recent Labs  Lab 09/17/17 0013 09/17/17 0843  TROPONINI <0.03 <0.03     Recent Labs  Lab 09/16/17 1740  TROPIPOC 0.00    BNPNo results for input(s): BNP, PROBNP in the last 168 hours.  DDimer No results for input(s): DDIMER in the last 168 hours. TSH: No results found for: TSH Lipids: Lab Results  Component Value Date   CHOL 146 09/17/2017   HDL 39 (L) 09/17/2017   LDLCALC 82 09/17/2017   TRIG 126 09/17/2017   CHOLHDL 3.7 09/17/2017   HgbA1c:No results found for: HGBA1C  Radiology/Studies:  Dg Eye Foreign Body  Result Date: 09/17/2017 CLINICAL DATA:  Metal working/exposure; clearance prior to MRI EXAM: ORBITS FOR FOREIGN BODY - 2 VIEW COMPARISON:  None. FINDINGS: There is no evidence of metallic foreign body within the orbits. No significant bone abnormality identified. Dental fillings noted. IMPRESSION: No evidence of metallic foreign body within the orbits. Electronically Signed   By: Anner Crete M.D.   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 Pt states that his symptoms have improved but states when he was walking into to the hospital he started having left sided chest pain as well. pt states left sided neck stiffness x the last 3 nights per pt. no prior chest hx per pt. EXAM: CHEST  2 VIEW COMPARISON:  None. FINDINGS: Cardiac silhouette is normal size and configuration. No mediastinal or hilar masses. There is no evidence of adenopathy. Lungs are clear. No pleural effusion or pneumothorax. Skeletal structures are intact. IMPRESSION: No active cardiopulmonary disease. Electronically Signed   By: Lajean Manes M.D.   On: 09/16/2017 18:29   Mr Jodene Nam Neck Wo Contrast  Result Date: 09/17/2017 CLINICAL DATA:  Stroke follow-up. Left upper extremity numbness and chest pain. EXAM: MRI HEAD WITHOUT CONTRAST MRA HEAD WITHOUT CONTRAST MRA NECK WITHOUT CONTRAST TECHNIQUE:  Multiplanar, multiecho pulse sequences of the brain and surrounding structures  were obtained without intravenous contrast. Angiographic images of the Circle of Willis were obtained using MRA technique without intravenous contrast. Angiographic images of the neck were obtained using MRA technique without intravenous contrast. Carotid stenosis measurements (when applicable) are obtained utilizing NASCET criteria, using the distal internal carotid diameter as the denominator. COMPARISON:  None. FINDINGS: MRI HEAD FINDINGS Brain: No acute infarction, hemorrhage, hydrocephalus, extra-axial collection or mass lesion. Mild central volume loss and lateral ventriculomegaly. Mild FLAIR hyperintensity in the cerebral white matter, usually old microvascular insults given patient's history. Vascular: Arterial findings below. Normal dural venous sinus flow voids. Skull and upper cervical spine: Negative Sinuses/Orbits: Retention cysts in the inferior left maxillary sinus. MRA HEAD FINDINGS Symmetric carotid arteries. Left dominant vertebral artery. Fetal type right PCA. There is mild narrowing of the mid basilar with no evidence of intramural hematoma on conventional brain MR. Mild irregularity of left PCA branches. These findings are likely atherosclerotic. No proximal or reversible flow limiting stenosis. No major branch occlusion. MRA NECK FINDINGS The arch could not be covered on the time-of-flight MRA. There is no visible stenosis, beading (when accounting for artifact at the skull base) or ulceration. Left dominant vertebral artery. IMPRESSION: Brain MRI: 1. No acute finding. 2. Minimal white matter disease, likely remote microvascular ischemia. Intracranial MRA: Atheromatous type narrowing of the mid basilar that is mild. Atheromatous change also likely present in distal left PCA branches. Neck MRA: Negative. Electronically Signed   By: Monte Fantasia M.D.   On: 09/17/2017 07:36   Mr Brain Wo Contrast  Result Date:  09/17/2017 CLINICAL DATA:  Stroke follow-up. Left upper extremity numbness and chest pain. EXAM: MRI HEAD WITHOUT CONTRAST MRA HEAD WITHOUT CONTRAST MRA NECK WITHOUT CONTRAST TECHNIQUE: Multiplanar, multiecho pulse sequences of the brain and surrounding structures were obtained without intravenous contrast. Angiographic images of the Circle of Willis were obtained using MRA technique without intravenous contrast. Angiographic images of the neck were obtained using MRA technique without intravenous contrast. Carotid stenosis measurements (when applicable) are obtained utilizing NASCET criteria, using the distal internal carotid diameter as the denominator. COMPARISON:  None. FINDINGS: MRI HEAD FINDINGS Brain: No acute infarction, hemorrhage, hydrocephalus, extra-axial collection or mass lesion. Mild central volume loss and lateral ventriculomegaly. Mild FLAIR hyperintensity in the cerebral white matter, usually old microvascular insults given patient's history. Vascular: Arterial findings below. Normal dural venous sinus flow voids. Skull and upper cervical spine: Negative Sinuses/Orbits: Retention cysts in the inferior left maxillary sinus. MRA HEAD FINDINGS Symmetric carotid arteries. Left dominant vertebral artery. Fetal type right PCA. There is mild narrowing of the mid basilar with no evidence of intramural hematoma on conventional brain MR. Mild irregularity of left PCA branches. These findings are likely atherosclerotic. No proximal or reversible flow limiting stenosis. No major branch occlusion. MRA NECK FINDINGS The arch could not be covered on the time-of-flight MRA. There is no visible stenosis, beading (when accounting for artifact at the skull base) or ulceration. Left dominant vertebral artery. IMPRESSION: Brain MRI: 1. No acute finding. 2. Minimal white matter disease, likely remote microvascular ischemia. Intracranial MRA: Atheromatous type narrowing of the mid basilar that is mild. Atheromatous  change also likely present in distal left PCA branches. Neck MRA: Negative. Electronically Signed   By: Monte Fantasia M.D.   On: 09/17/2017 07:36   Mr Jodene Nam Head Wo Contrast  Result Date: 09/17/2017 CLINICAL DATA:  Stroke follow-up. Left upper extremity numbness and chest pain. EXAM: MRI HEAD WITHOUT CONTRAST MRA HEAD WITHOUT CONTRAST MRA NECK  WITHOUT CONTRAST TECHNIQUE: Multiplanar, multiecho pulse sequences of the brain and surrounding structures were obtained without intravenous contrast. Angiographic images of the Circle of Willis were obtained using MRA technique without intravenous contrast. Angiographic images of the neck were obtained using MRA technique without intravenous contrast. Carotid stenosis measurements (when applicable) are obtained utilizing NASCET criteria, using the distal internal carotid diameter as the denominator. COMPARISON:  None. FINDINGS: MRI HEAD FINDINGS Brain: No acute infarction, hemorrhage, hydrocephalus, extra-axial collection or mass lesion. Mild central volume loss and lateral ventriculomegaly. Mild FLAIR hyperintensity in the cerebral white matter, usually old microvascular insults given patient's history. Vascular: Arterial findings below. Normal dural venous sinus flow voids. Skull and upper cervical spine: Negative Sinuses/Orbits: Retention cysts in the inferior left maxillary sinus. MRA HEAD FINDINGS Symmetric carotid arteries. Left dominant vertebral artery. Fetal type right PCA. There is mild narrowing of the mid basilar with no evidence of intramural hematoma on conventional brain MR. Mild irregularity of left PCA branches. These findings are likely atherosclerotic. No proximal or reversible flow limiting stenosis. No major branch occlusion. MRA NECK FINDINGS The arch could not be covered on the time-of-flight MRA. There is no visible stenosis, beading (when accounting for artifact at the skull base) or ulceration. Left dominant vertebral artery. IMPRESSION: Brain  MRI: 1. No acute finding. 2. Minimal white matter disease, likely remote microvascular ischemia. Intracranial MRA: Atheromatous type narrowing of the mid basilar that is mild. Atheromatous change also likely present in distal left PCA branches. Neck MRA: Negative. Electronically Signed   By: Monte Fantasia M.D.   On: 09/17/2017 07:36    Assessment and Plan:   1.Chest pain with negative troponins:  -No further chest pain/pressure  -Trop, <0.03, <0.03, <0.03 -EKG non concerning, no ST-T wave changes -ASA, will decrease to 81mg  QD -Chest pressure could very well be related to uncontrolled HTN  -On lisinopril 20mg  PO QD, hydrochlorothiazide 12.5mg  PO>> amlodipine added 09/17/17>>>will need to adjust if renal function worsens. Monitor closely.  -Will order Lexiscan Myoview stress test for tomorrow for further evaluation   2. Left-sided numbness: -Symptoms initially concerning for TIA>>>MRI completed 09/16/17 with no acute findings -Symptoms now resolved  3. HTN: -Elevated, 162/85>194/91>182/94>202/99 -? cause of neurologic and chest symptoms -Home hydrochlorothiazide and lisinopril added, monitor renal function closely  -Amlodipine added per IM   4. Chronic kidney disease stage II: -Improved, Cr 1.04 today. Was 1.31 on admission  -Baseline appears to be >1.0   5. Hypothyroidism: -Stable, TSH, 2.643 -On Synthroid 178mcg  6. HLD: -CHO-146, HDL-39, LDL-82, Trig-126 -Add statin for greater risk reduction    For questions or updates, please contact [facility] Please consult [email or web address] for contact info under Cardiology/STEMI.   SignedKathyrn Drown NP-[facility] Pager: [phone] 09/17/2017 11:21 AM

## 2017-09-18 ENCOUNTER — Observation Stay (HOSPITAL_BASED_OUTPATIENT_CLINIC_OR_DEPARTMENT_OTHER): Payer: 59

## 2017-09-18 DIAGNOSIS — Z79890 Hormone replacement therapy (postmenopausal): Secondary | ICD-10-CM | POA: Diagnosis not present

## 2017-09-18 DIAGNOSIS — R0789 Other chest pain: Secondary | ICD-10-CM | POA: Diagnosis not present

## 2017-09-18 DIAGNOSIS — I129 Hypertensive chronic kidney disease with stage 1 through stage 4 chronic kidney disease, or unspecified chronic kidney disease: Secondary | ICD-10-CM | POA: Diagnosis not present

## 2017-09-18 DIAGNOSIS — Z7982 Long term (current) use of aspirin: Secondary | ICD-10-CM | POA: Diagnosis not present

## 2017-09-18 DIAGNOSIS — N182 Chronic kidney disease, stage 2 (mild): Secondary | ICD-10-CM | POA: Diagnosis not present

## 2017-09-18 DIAGNOSIS — E1169 Type 2 diabetes mellitus with other specified complication: Secondary | ICD-10-CM | POA: Diagnosis not present

## 2017-09-18 DIAGNOSIS — I10 Essential (primary) hypertension: Secondary | ICD-10-CM | POA: Diagnosis not present

## 2017-09-18 DIAGNOSIS — E785 Hyperlipidemia, unspecified: Secondary | ICD-10-CM | POA: Diagnosis not present

## 2017-09-18 DIAGNOSIS — E1122 Type 2 diabetes mellitus with diabetic chronic kidney disease: Secondary | ICD-10-CM | POA: Diagnosis not present

## 2017-09-18 DIAGNOSIS — R079 Chest pain, unspecified: Secondary | ICD-10-CM | POA: Diagnosis not present

## 2017-09-18 DIAGNOSIS — E669 Obesity, unspecified: Secondary | ICD-10-CM | POA: Diagnosis not present

## 2017-09-18 DIAGNOSIS — R200 Anesthesia of skin: Secondary | ICD-10-CM | POA: Diagnosis not present

## 2017-09-18 DIAGNOSIS — E039 Hypothyroidism, unspecified: Secondary | ICD-10-CM | POA: Diagnosis not present

## 2017-09-18 LAB — NM MYOCAR MULTI W/SPECT W/WALL MOTION / EF
CHL CUP MPHR: 155 {beats}/min
CSEPED: 0 min
CSEPEW: 1 METS
CSEPPHR: 102 {beats}/min
Exercise duration (sec): 0 s
Percent HR: 65 %
Rest HR: 74 {beats}/min

## 2017-09-18 LAB — BASIC METABOLIC PANEL
Anion gap: 9 (ref 5–15)
BUN: 13 mg/dL (ref 6–20)
CALCIUM: 9 mg/dL (ref 8.9–10.3)
CO2: 26 mmol/L (ref 22–32)
CREATININE: 0.97 mg/dL (ref 0.61–1.24)
Chloride: 104 mmol/L (ref 101–111)
Glucose, Bld: 179 mg/dL — ABNORMAL HIGH (ref 65–99)
Potassium: 3.4 mmol/L — ABNORMAL LOW (ref 3.5–5.1)
Sodium: 139 mmol/L (ref 135–145)

## 2017-09-18 LAB — GLUCOSE, CAPILLARY
Glucose-Capillary: 179 mg/dL — ABNORMAL HIGH (ref 65–99)
Glucose-Capillary: 218 mg/dL — ABNORMAL HIGH (ref 65–99)
Glucose-Capillary: 138 mg/dL — ABNORMAL HIGH (ref 65–99)

## 2017-09-18 LAB — CBC
HCT: 42.9 % (ref 39.0–52.0)
Hemoglobin: 14.7 g/dL (ref 13.0–17.0)
MCH: 29.3 pg (ref 26.0–34.0)
MCHC: 34.3 g/dL (ref 30.0–36.0)
MCV: 85.5 fL (ref 78.0–100.0)
PLATELETS: 198 10*3/uL (ref 150–400)
RBC: 5.02 MIL/uL (ref 4.22–5.81)
RDW: 13.3 % (ref 11.5–15.5)
WBC: 6.9 10*3/uL (ref 4.0–10.5)

## 2017-09-18 LAB — HEMOGLOBIN A1C
HEMOGLOBIN A1C: 7 % — AB (ref 4.8–5.6)
MEAN PLASMA GLUCOSE: 154 mg/dL

## 2017-09-18 MED ORDER — TECHNETIUM TC 99M TETROFOSMIN IV KIT
30.0000 | PACK | Freq: Once | INTRAVENOUS | Status: AC | PRN
  Administered 2017-09-18: 30 via INTRAVENOUS

## 2017-09-18 MED ORDER — TECHNETIUM TC 99M TETROFOSMIN IV KIT
10.0000 | PACK | Freq: Once | INTRAVENOUS | Status: AC | PRN
  Administered 2017-09-18: 10 via INTRAVENOUS

## 2017-09-18 MED ORDER — POTASSIUM CHLORIDE CRYS ER 20 MEQ PO TBCR
40.0000 meq | EXTENDED_RELEASE_TABLET | Freq: Once | ORAL | Status: AC
  Administered 2017-09-18: 40 meq via ORAL
  Filled 2017-09-18: qty 2

## 2017-09-18 MED ORDER — AMLODIPINE BESYLATE 10 MG PO TABS: 10.0000 mg | ORAL_TABLET | Freq: Every day | ORAL | Status: DC

## 2017-09-18 MED ORDER — LISINOPRIL-HYDROCHLOROTHIAZIDE 20-12.5 MG PO TABS: 2.0000 | ORAL_TABLET | Freq: Every day | ORAL | Status: DC

## 2017-09-18 MED ORDER — LISINOPRIL 20 MG PO TABS: 20.0000 mg | ORAL_TABLET | Freq: Every day | ORAL | Status: DC

## 2017-09-18 MED ORDER — REGADENOSON 0.4 MG/5ML IV SOLN
0.4000 mg | Freq: Once | INTRAVENOUS | Status: AC
  Administered 2017-09-18: 0.4 mg via INTRAVENOUS
  Filled 2017-09-18: qty 5

## 2017-09-18 MED ORDER — REGADENOSON 0.4 MG/5ML IV SOLN
INTRAVENOUS | Status: AC
  Administered 2017-09-18: 0.4 mg via INTRAVENOUS
  Filled 2017-09-18: qty 5

## 2017-09-18 MED ORDER — AMLODIPINE BESYLATE 10 MG PO TABS
10.0000 mg | ORAL_TABLET | Freq: Every day | ORAL | Status: DC
  Administered 2017-09-18: 10 mg via ORAL
  Filled 2017-09-18: qty 1

## 2017-09-18 MED ORDER — AMLODIPINE BESYLATE 10 MG PO TABS: 10.0000 mg | ORAL_TABLET | Freq: Every day | ORAL | 0 refills | Status: DC

## 2017-09-18 MED ORDER — LISINOPRIL 20 MG PO TABS
20.0000 mg | ORAL_TABLET | Freq: Once | ORAL | Status: AC
  Administered 2017-09-18: 20 mg via ORAL
  Filled 2017-09-18: qty 1

## 2017-09-18 MED FILL — AMLODIPINE BESYLATE 10 MG T: 10 | 30 days supply | Qty: 30 | Fill #0

## 2017-09-18 NOTE — Progress Notes (Signed)
Reviewed discharge instructions/medications with patient. Answered his questions. Pt's BP is coming down. Dr. Darlyn Chamber. Medications have been adjusted for patient. Patient is ready for discharge.

## 2017-09-18 NOTE — Progress Notes (Signed)
Progress Note  Patient Name: Jim Salazar Date of Encounter: 09/18/2017  Primary Cardiologist: Jim Him, MD   Subjective   Denies any CP or SOB overnight. Feeling well.   Inpatient Medications    Scheduled Meds: . regadenoson      . amLODipine  5 mg Oral Daily  . aspirin EC  81 mg Oral QPM  . enoxaparin (LOVENOX) injection  40 mg Subcutaneous Q24H  . hydrochlorothiazide  12.5 mg Oral Daily   And  . lisinopril  20 mg Oral Daily  . levothyroxine  112 mcg Oral QAC breakfast  . linagliptin  5 mg Oral Daily  . metFORMIN  1,000 mg Oral BID WC  . PARoxetine  20 mg Oral Daily  . regadenoson  0.4 mg Intravenous Once   Continuous Infusions:  PRN Meds: acetaminophen **OR** acetaminophen, hydrALAZINE, nitroGLYCERIN, ondansetron **OR** ondansetron (ZOFRAN) IV, technetium tetrofosmin   Vital Signs    Vitals:   09/17/17 1740 09/17/17 2102 09/18/17 0054 09/18/17 0456  BP: (!) 170/70 (!) 174/87 (!) 173/84 (!) 171/92  Pulse:  74 70 70  Resp:  16  15  Temp:  97.7 F (36.5 C) 97.9 F (36.6 C) 97.9 F (36.6 C)  TempSrc:  Oral Oral Oral  SpO2:  97% 94% 97%  Weight:    221 lb 14.4 oz (100.7 kg)  Height:        Intake/Output Summary (Last 24 hours) at 09/18/2017 0846 Last data filed at 09/18/2017 0456 Gross per 24 hour  Intake 480 ml  Output 1020 ml  Net -540 ml   Filed Weights   09/16/17 1706 09/17/17 0055 09/18/17 0456  Weight: 225 lb (102.1 kg) 224 lb 3.2 oz (101.7 kg) 221 lb 14.4 oz (100.7 kg)    Telemetry    NSR, one episode of artifact that was hard to interpret - Personally Reviewed  ECG    NSR without ischemic changes - Personally Reviewed  Physical Exam   GEN: No acute distress.   Neck: No JVD Cardiac: RRR, no murmurs, rubs, or gallops.  Respiratory: Clear to auscultation bilaterally. GI: Soft, nontender, non-distended  MS: No edema; No deformity. Neuro:  Nonfocal  Psych: Normal affect   Labs    Chemistry Recent Labs  Lab 09/16/17 1724  09/17/17 0013 09/17/17 0843 09/17/17 1154 09/18/17 0510  NA 134*  --  136  --  139  K 3.6  --  3.6  --  3.4*  CL 101  --  100*  --  104  CO2 22  --  24  --  26  GLUCOSE 180*  --  275*  --  179*  BUN 19  --  14  --  13  CREATININE 1.31* 1.16 1.04  --  0.97  CALCIUM 9.1  --  9.1  --  9.0  PROT  --   --   --  6.3*  --   ALBUMIN  --   --   --  3.7  --   AST  --   --   --  25  --   ALT  --   --   --  16*  --   ALKPHOS  --   --   --  60  --   BILITOT  --   --   --  1.4*  --   GFRNONAA 56* >60 >60  --  >60  GFRAA >60 >60 >60  --  >60  ANIONGAP 11  --  12  --  9     Hematology Recent Labs  Lab 09/17/17 0013 09/17/17 0843 09/18/17 0510  WBC 9.5 7.0 6.9  RBC 4.90 5.02 5.02  HGB 14.5 14.6 14.7  HCT 41.8 42.9 42.9  MCV 85.3 85.5 85.5  MCH 29.6 29.1 29.3  MCHC 34.7 34.0 34.3  RDW 13.2 13.3 13.3  PLT 202 202 198    Cardiac Enzymes Recent Labs  Lab 09/17/17 0013 09/17/17 0843 09/17/17 1154  TROPONINI <0.03 <0.03 <0.03    Recent Labs  Lab 09/16/17 1740  TROPIPOC 0.00     BNPNo results for input(s): BNP, PROBNP in the last 168 hours.   DDimer  Recent Labs  Lab 09/17/17 1154  DDIMER <0.27     Radiology    Dg Eye Foreign Body  Result Date: 09/17/2017 CLINICAL DATA:  Metal working/exposure; clearance prior to MRI EXAM: ORBITS FOR FOREIGN BODY - 2 VIEW COMPARISON:  None. FINDINGS: There is no evidence of metallic foreign body within the orbits. No significant bone abnormality identified. Dental fillings noted. IMPRESSION: No evidence of metallic foreign body within the orbits. Electronically Signed   By: Jim Salazar M.D.   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 Pt states that his symptoms have improved but states when he was walking into to the hospital he started having  left sided chest pain as well. pt states left sided neck stiffness x the last 3 nights per pt. no prior chest hx per pt. EXAM: CHEST  2 VIEW COMPARISON:  None. FINDINGS: Cardiac silhouette is normal size and configuration. No mediastinal or hilar masses. There is no evidence of adenopathy. Lungs are clear. No pleural effusion or pneumothorax. Skeletal structures are intact. IMPRESSION: No active cardiopulmonary disease. Electronically Signed   By: Jim Salazar M.D.   On: 09/16/2017 18:29   Mr Jodene Nam Neck Wo Contrast  Result Date: 09/17/2017 CLINICAL DATA:  Stroke follow-up. Left upper extremity numbness and chest pain. EXAM: MRI HEAD WITHOUT CONTRAST MRA HEAD WITHOUT CONTRAST MRA NECK WITHOUT CONTRAST TECHNIQUE: Multiplanar, multiecho pulse sequences of the brain and surrounding structures were obtained without intravenous contrast. Angiographic images of the Circle of Willis were obtained using MRA technique without intravenous contrast. Angiographic images of the neck were obtained using MRA technique without intravenous contrast. Carotid stenosis measurements (when applicable) are obtained utilizing NASCET criteria, using the distal internal carotid diameter as the denominator. COMPARISON:  None. FINDINGS: MRI HEAD FINDINGS Brain: No acute infarction, hemorrhage, hydrocephalus, extra-axial collection or mass lesion. Mild central volume loss and lateral ventriculomegaly. Mild FLAIR hyperintensity in the cerebral white matter, usually old microvascular insults given patient's history. Vascular: Arterial findings below. Normal dural venous sinus flow voids. Skull and upper cervical spine: Negative Sinuses/Orbits: Retention cysts in the inferior left maxillary sinus. MRA HEAD FINDINGS Symmetric carotid arteries. Left dominant vertebral artery. Fetal type right PCA. There is mild narrowing of the mid basilar with no evidence of intramural hematoma on conventional brain MR. Mild irregularity of left PCA branches.  These findings are likely atherosclerotic. No proximal or reversible flow limiting stenosis. No major branch occlusion. MRA NECK FINDINGS The arch could not be covered on the time-of-flight MRA. There is no visible stenosis, beading (when accounting for artifact at the skull base) or ulceration. Left dominant vertebral artery. IMPRESSION: Brain  MRI: 1. No acute finding. 2. Minimal white matter disease, likely remote microvascular ischemia. Intracranial MRA: Atheromatous type narrowing of the mid basilar that is mild. Atheromatous change also likely present in distal left PCA branches. Neck MRA: Negative. Electronically Signed   By: Monte Fantasia M.D.   On: 09/17/2017 07:36   Mr Brain Wo Contrast  Result Date: 09/17/2017 CLINICAL DATA:  Stroke follow-up. Left upper extremity numbness and chest pain. EXAM: MRI HEAD WITHOUT CONTRAST MRA HEAD WITHOUT CONTRAST MRA NECK WITHOUT CONTRAST TECHNIQUE: Multiplanar, multiecho pulse sequences of the brain and surrounding structures were obtained without intravenous contrast. Angiographic images of the Circle of Willis were obtained using MRA technique without intravenous contrast. Angiographic images of the neck were obtained using MRA technique without intravenous contrast. Carotid stenosis measurements (when applicable) are obtained utilizing NASCET criteria, using the distal internal carotid diameter as the denominator. COMPARISON:  None. FINDINGS: MRI HEAD FINDINGS Brain: No acute infarction, hemorrhage, hydrocephalus, extra-axial collection or mass lesion. Mild central volume loss and lateral ventriculomegaly. Mild FLAIR hyperintensity in the cerebral white matter, usually old microvascular insults given patient's history. Vascular: Arterial findings below. Normal dural venous sinus flow voids. Skull and upper cervical spine: Negative Sinuses/Orbits: Retention cysts in the inferior left maxillary sinus. MRA HEAD FINDINGS Symmetric carotid arteries. Left dominant  vertebral artery. Fetal type right PCA. There is mild narrowing of the mid basilar with no evidence of intramural hematoma on conventional brain MR. Mild irregularity of left PCA branches. These findings are likely atherosclerotic. No proximal or reversible flow limiting stenosis. No major branch occlusion. MRA NECK FINDINGS The arch could not be covered on the time-of-flight MRA. There is no visible stenosis, beading (when accounting for artifact at the skull base) or ulceration. Left dominant vertebral artery. IMPRESSION: Brain MRI: 1. No acute finding. 2. Minimal white matter disease, likely remote microvascular ischemia. Intracranial MRA: Atheromatous type narrowing of the mid basilar that is mild. Atheromatous change also likely present in distal left PCA branches. Neck MRA: Negative. Electronically Signed   By: Monte Fantasia M.D.   On: 09/17/2017 07:36   Mr Jodene Nam Head Wo Contrast  Result Date: 09/17/2017 CLINICAL DATA:  Stroke follow-up. Left upper extremity numbness and chest pain. EXAM: MRI HEAD WITHOUT CONTRAST MRA HEAD WITHOUT CONTRAST MRA NECK WITHOUT CONTRAST TECHNIQUE: Multiplanar, multiecho pulse sequences of the brain and surrounding structures were obtained without intravenous contrast. Angiographic images of the Circle of Willis were obtained using MRA technique without intravenous contrast. Angiographic images of the neck were obtained using MRA technique without intravenous contrast. Carotid stenosis measurements (when applicable) are obtained utilizing NASCET criteria, using the distal internal carotid diameter as the denominator. COMPARISON:  None. FINDINGS: MRI HEAD FINDINGS Brain: No acute infarction, hemorrhage, hydrocephalus, extra-axial collection or mass lesion. Mild central volume loss and lateral ventriculomegaly. Mild FLAIR hyperintensity in the cerebral white matter, usually old microvascular insults given patient's history. Vascular: Arterial findings below. Normal dural venous  sinus flow voids. Skull and upper cervical spine: Negative Sinuses/Orbits: Retention cysts in the inferior left maxillary sinus. MRA HEAD FINDINGS Symmetric carotid arteries. Left dominant vertebral artery. Fetal type right PCA. There is mild narrowing of the mid basilar with no evidence of intramural hematoma on conventional brain MR. Mild irregularity of left PCA branches. These findings are likely atherosclerotic. No proximal or reversible flow limiting stenosis. No major branch occlusion. MRA NECK FINDINGS The arch could not be covered on the time-of-flight MRA. There is no visible stenosis, beading (when accounting for  artifact at the skull base) or ulceration. Left dominant vertebral artery. IMPRESSION: Brain MRI: 1. No acute finding. 2. Minimal white matter disease, likely remote microvascular ischemia. Intracranial MRA: Atheromatous type narrowing of the mid basilar that is mild. Atheromatous change also likely present in distal left PCA branches. Neck MRA: Negative. Electronically Signed   By: Monte Fantasia M.D.   On: 09/17/2017 07:36    Cardiac Studies   Echo /25/2019 LV EF: 60% -   65%  Study Conclusions  - Left ventricle: The cavity size was normal. Wall thickness was   increased in a pattern of mild LVH. Systolic function was normal.   The estimated ejection fraction was in the range of 60% to 65%. - Mitral valve: There was mild regurgitation.  Patient Profile     67 y.o. male with PMH of HTN, DM II and hypothyroidism who presented with 15 min of LUE numbness radiating to his left jaw. MRI of brain was negative for CVA. Patient does not have prior h/o CAD. Planing for Myoview   Assessment & Plan    1. L jaw numbness  - serial trop negative. Echo 09/17/2017 EF 60-65%, mild MR  - MRI of brain negative for acute changes  - pending lexiscan myoview, read by [facility] radiology. If negative, no further cardiac workup needed and focus on treating BP   2. HTN: Blood pressure  elevated. Increase amlodipine to 10mg  daily  3. DM II: Hemoglobin A1C 7.0   4. Hypothyroidism  For questions or updates, please contact [place] Please consult [email or web address] for contact info under Cardiology/STEMI.      Hilbert Corrigan, PA  09/18/2017, 8:46 AM

## 2017-09-18 NOTE — Progress Notes (Addendum)
Myoview result below:  IMPRESSION: 1. 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 No definite peri-infarct ischemia.  2. Inferior wall showing no wall thickening or wall motion. Mild global hypokinesis otherwise.  3. Left ventricular ejection fraction 40%  4. Non invasive risk stratification*: High risk   Reviewed the myoview with Dr. Radford Pax who felt his symptom likely is related to uncontrolled BP. Myoview result was felt to be lower risk and maybe diaphragmatic attenuation. No plan for cath or coronary CT unless chest pain recur. Cardiology signing off, will arrange 2 week outpatient visit with Dr. Radford Pax. Will defer further BP medication adjustment to primary team  Signed, Almyra Deforest PA Pager: [phone]

## 2017-09-18 NOTE — Progress Notes (Signed)
Inpatient Diabetes Program Recommendations  AACE/ADA: New Consensus Statement on Inpatient Glycemic Control (2015)  Target Ranges:  Prepandial:   less than 140 mg/dL      Peak postprandial:   less than 180 mg/dL (1-2 hours)      Critically ill patients:  140 - 180 mg/dL   Lab Results  Component Value Date   GLUCAP 218 (H) 09/18/2017   HGBA1C 7.0 (H) 09/17/2017    Review of Glycemic Control  Diabetes history: DM2 Outpatient Diabetes medications: Janumet 50/1000 mg bid Current orders for Inpatient glycemic control: metformin 1000 mg bid, tradjenta 5 mg QD HgbA1C - 7% - good glycemic control PTA  Inpatient Diabetes Program Recommendations:     Add Novolog 0-9 units tidwc and hs.  Will follow.  Thank you. Lorenda Peck, RD, LDN, CDE Inpatient Diabetes Coordinator [phone]

## 2017-09-18 NOTE — Discharge Summary (Signed)
Physician Discharge Summary  JEROLD YOSS YPP:[id number] DOB: 08/16/1950 DOA: 09/16/2017  PCP: Sinda Du, MD  Admit date: 09/16/2017 Discharge date: 09/18/2017   Recommendations for Outpatient Follow-Up:   1. Repeat FLP-- may need statin added 2. Neurology referral made 3. Cardiology follow up 4. Patient will need medication adjustment for BP meds 5. BMP re Cr PRN   Discharge Diagnosis:   Principal Problem:   Chest pain Active Problems:   Essential hypertension   Diabetes mellitus type 2 in obese (HCC)   Left sided numbness   Discharge disposition:  Home.  Discharge Condition: Improved.  Diet recommendation: Low sodium, heart healthy.  Carbohydrate-modified  Wound care: None.   History of Present Illness:   Jim Salazar is a 67 y.o. male with history of hypertension, diabetes mellitus type 2, hypothyroidism at around 4 PM last evening while on the computer started developing left upper extremity numbness which started off from the fingers going up towards his neck and his left jaw and involve the left tongue.  The whole symptoms lasted for around 45 minutes.  Did not have any weakness of the extremities or any difficulty speaking or swallowing or any facial droop.  He did not have any visual symptoms.  Concerned with the symptoms patient came to the ER.     Hospital Course by Problem:   Chest pain -CE negative -echo:- Left ventricle: The cavity size was normal. Wall thickness was   increased in a pattern of mild LVH. Systolic function was normal.   The estimated ejection fraction was in the range of 60% to 65%. - Mitral valve: There was mild regurgitation. -stress test read by cards as low risk  Left-sided numbness concerning for TIA patient symptoms have resolved at this time. Lasted for 45 minutes. Dr. Raliegh Ip Discussed with neurologist Dr. Leonel Ramsay.  -ASA -FLP: 80 -HgbA1c: 7 -outpatient neuro follow up- referral placed  DM -resume home  meds -HgbA1c: 7 -dietary adjustments needed  Hypertension  -resume home meds-- is taking 2 of the combo HCTZ/lisinopril at home-- in hospital, we were only giving 1 per [facility] -start norvasc for better control  Chronic kidney disease stage II -monitor with PRN BMP  Hypothyroidism -Synthroid -TSH normal        Medical Consultants:    cards   Discharge Exam:   Vitals:   09/18/17 1042 09/18/17 1151  BP: (!) 161/77 (!) 141/98  Pulse: 80 66  Resp:  18  Temp:  98 F (36.7 C)  SpO2: 95% 96%   Vitals:   09/18/17 0925 09/18/17 0927 09/18/17 1042 09/18/17 1151  BP: (!) 176/87 (!) 185/95 (!) 161/77 (!) 141/98  Pulse:   80 66  Resp:    18  Temp:    98 F (36.7 C)  TempSrc:    Oral  SpO2:   95% 96%  Weight:      Height:        Gen:  NAD    The results of significant diagnostics from this hospitalization (including imaging, microbiology, ancillary and laboratory) are listed below for reference.     Procedures and Diagnostic Studies:   Dg Eye Foreign Body  Result Date: 09/17/2017 CLINICAL DATA:  Metal working/exposure; clearance prior to MRI EXAM: ORBITS FOR FOREIGN BODY - 2 VIEW COMPARISON:  None. FINDINGS: There is no evidence of metallic foreign body within the orbits. No significant bone abnormality identified. Dental fillings noted. IMPRESSION: No evidence of metallic foreign body within the orbits. Electronically Signed  By: Anner Crete M.D.   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 Pt states that his symptoms have improved but states when he was walking into to the hospital he started having left sided chest pain as well. pt states left sided neck stiffness x the last 3 nights per pt. no prior chest hx per pt. EXAM: CHEST  2 VIEW COMPARISON:  None. FINDINGS:  Cardiac silhouette is normal size and configuration. No mediastinal or hilar masses. There is no evidence of adenopathy. Lungs are clear. No pleural effusion or pneumothorax. Skeletal structures are intact. IMPRESSION: No active cardiopulmonary disease. Electronically Signed   By: Lajean Manes M.D.   On: 09/16/2017 18:29   Mr Jodene Nam Neck Wo Contrast  Result Date: 09/17/2017 CLINICAL DATA:  Stroke follow-up. Left upper extremity numbness and chest pain. EXAM: MRI HEAD WITHOUT CONTRAST MRA HEAD WITHOUT CONTRAST MRA NECK WITHOUT CONTRAST TECHNIQUE: Multiplanar, multiecho pulse sequences of the brain and surrounding structures were obtained without intravenous contrast. Angiographic images of the Circle of Willis were obtained using MRA technique without intravenous contrast. Angiographic images of the neck were obtained using MRA technique without intravenous contrast. Carotid stenosis measurements (when applicable) are obtained utilizing NASCET criteria, using the distal internal carotid diameter as the denominator. COMPARISON:  None. FINDINGS: MRI HEAD FINDINGS Brain: No acute infarction, hemorrhage, hydrocephalus, extra-axial collection or mass lesion. Mild central volume loss and lateral ventriculomegaly. Mild FLAIR hyperintensity in the cerebral white matter, usually old microvascular insults given patient's history. Vascular: Arterial findings below. Normal dural venous sinus flow voids. Skull and upper cervical spine: Negative Sinuses/Orbits: Retention cysts in the inferior left maxillary sinus. MRA HEAD FINDINGS Symmetric carotid arteries. Left dominant vertebral artery. Fetal type right PCA. There is mild narrowing of the mid basilar with no evidence of intramural hematoma on conventional brain MR. Mild irregularity of left PCA branches. These findings are likely atherosclerotic. No proximal or reversible flow limiting stenosis. No major branch occlusion. MRA NECK FINDINGS The arch could not be covered on  the time-of-flight MRA. There is no visible stenosis, beading (when accounting for artifact at the skull base) or ulceration. Left dominant vertebral artery. IMPRESSION: Brain MRI: 1. No acute finding. 2. Minimal white matter disease, likely remote microvascular ischemia. Intracranial MRA: Atheromatous type narrowing of the mid basilar that is mild. Atheromatous change also likely present in distal left PCA branches. Neck MRA: Negative. Electronically Signed   By: Monte Fantasia M.D.   On: 09/17/2017 07:36   Mr Brain Wo Contrast  Result Date: 09/17/2017 CLINICAL DATA:  Stroke follow-up. Left upper extremity numbness and chest pain. EXAM: MRI HEAD WITHOUT CONTRAST MRA HEAD WITHOUT CONTRAST MRA NECK WITHOUT CONTRAST TECHNIQUE: Multiplanar, multiecho pulse sequences of the brain and surrounding structures were obtained without intravenous contrast. Angiographic images of the Circle of Willis were obtained using MRA technique without intravenous contrast. Angiographic images of the neck were obtained using MRA technique without intravenous contrast. Carotid stenosis measurements (when applicable) are obtained utilizing NASCET criteria, using the distal internal carotid diameter as the denominator. COMPARISON:  None. FINDINGS: MRI HEAD FINDINGS Brain: No acute infarction, hemorrhage, hydrocephalus, extra-axial collection or mass lesion. Mild central volume loss and lateral ventriculomegaly. Mild FLAIR hyperintensity in the cerebral white matter, usually old microvascular insults given patient's history.  Vascular: Arterial findings below. Normal dural venous sinus flow voids. Skull and upper cervical spine: Negative Sinuses/Orbits: Retention cysts in the inferior left maxillary sinus. MRA HEAD FINDINGS Symmetric carotid arteries. Left dominant vertebral artery. Fetal type right PCA. There is mild narrowing of the mid basilar with no evidence of intramural hematoma on conventional brain MR. Mild irregularity of left  PCA branches. These findings are likely atherosclerotic. No proximal or reversible flow limiting stenosis. No major branch occlusion. MRA NECK FINDINGS The arch could not be covered on the time-of-flight MRA. There is no visible stenosis, beading (when accounting for artifact at the skull base) or ulceration. Left dominant vertebral artery. IMPRESSION: Brain MRI: 1. No acute finding. 2. Minimal white matter disease, likely remote microvascular ischemia. Intracranial MRA: Atheromatous type narrowing of the mid basilar that is mild. Atheromatous change also likely present in distal left PCA branches. Neck MRA: Negative. Electronically Signed   By: Monte Fantasia M.D.   On: 09/17/2017 07:36   Mr Jodene Nam Head Wo Contrast  Result Date: 09/17/2017 CLINICAL DATA:  Stroke follow-up. Left upper extremity numbness and chest pain. EXAM: MRI HEAD WITHOUT CONTRAST MRA HEAD WITHOUT CONTRAST MRA NECK WITHOUT CONTRAST TECHNIQUE: Multiplanar, multiecho pulse sequences of the brain and surrounding structures were obtained without intravenous contrast. Angiographic images of the Circle of Willis were obtained using MRA technique without intravenous contrast. Angiographic images of the neck were obtained using MRA technique without intravenous contrast. Carotid stenosis measurements (when applicable) are obtained utilizing NASCET criteria, using the distal internal carotid diameter as the denominator. COMPARISON:  None. FINDINGS: MRI HEAD FINDINGS Brain: No acute infarction, hemorrhage, hydrocephalus, extra-axial collection or mass lesion. Mild central volume loss and lateral ventriculomegaly. Mild FLAIR hyperintensity in the cerebral white matter, usually old microvascular insults given patient's history. Vascular: Arterial findings below. Normal dural venous sinus flow voids. Skull and upper cervical spine: Negative Sinuses/Orbits: Retention cysts in the inferior left maxillary sinus. MRA HEAD FINDINGS Symmetric carotid arteries.  Left dominant vertebral artery. Fetal type right PCA. There is mild narrowing of the mid basilar with no evidence of intramural hematoma on conventional brain MR. Mild irregularity of left PCA branches. These findings are likely atherosclerotic. No proximal or reversible flow limiting stenosis. No major branch occlusion. MRA NECK FINDINGS The arch could not be covered on the time-of-flight MRA. There is no visible stenosis, beading (when accounting for artifact at the skull base) or ulceration. Left dominant vertebral artery. IMPRESSION: Brain MRI: 1. No acute finding. 2. Minimal white matter disease, likely remote microvascular ischemia. Intracranial MRA: Atheromatous type narrowing of the mid basilar that is mild. Atheromatous change also likely present in distal left PCA branches. Neck MRA: Negative. Electronically Signed   By: Monte Fantasia M.D.   On: 09/17/2017 07:36     Labs:   Basic Metabolic Panel: Recent Labs  Lab 09/16/17 1724 09/17/17 0013 09/17/17 0843 09/18/17 0510  NA 134*  --  136 139  K 3.6  --  3.6 3.4*  CL 101  --  100* 104  CO2 22  --  24 26  GLUCOSE 180*  --  275* 179*  BUN 19  --  14 13  CREATININE 1.31* 1.16 1.04 0.97  CALCIUM 9.1  --  9.1 9.0   GFR Estimated Creatinine Clearance: 93.2 mL/min (by C-G formula based on SCr of 0.97 mg/dL). Liver Function Tests: Recent Labs  Lab 09/17/17 1154  AST 25  ALT 16*  ALKPHOS 60  BILITOT 1.4*  PROT 6.3*  ALBUMIN 3.7   No results for input(s): LIPASE, AMYLASE in the last 168 hours. No results for input(s): AMMONIA in the last 168 hours. Coagulation profile No results for input(s): INR, PROTIME in the last 168 hours.  CBC: Recent Labs  Lab 09/16/17 1724 09/17/17 0013 09/17/17 0843 09/18/17 0510  WBC 8.9 9.5 7.0 6.9  HGB 15.1 14.5 14.6 14.7  HCT 43.0 41.8 42.9 42.9  MCV 85.0 85.3 85.5 85.5  PLT 218 202 202 198   Cardiac Enzymes: Recent Labs  Lab 09/17/17 0013 09/17/17 0843 09/17/17 1154  TROPONINI  <0.03 <0.03 <0.03   BNP: Invalid input(s): POCBNP CBG: Recent Labs  Lab 09/17/17 1624 09/17/17 2058 09/18/17 0731 09/18/17 1149 09/18/17 1609  GLUCAP 172* 204* 179* 218* 138*   D-Dimer Recent Labs    09/17/17 1154  DDIMER <0.27   Hgb A1c Recent Labs    09/17/17 0843  HGBA1C 7.0*   Lipid Profile Recent Labs    09/17/17 0843  CHOL 146  HDL 39*  LDLCALC 82  TRIG 126  CHOLHDL 3.7   Thyroid function studies Recent Labs    09/17/17 1154  TSH 2.643   Anemia work up No results for input(s): VITAMINB12, FOLATE, FERRITIN, TIBC, IRON, RETICCTPCT in the last 72 hours. Microbiology No results found for this or any previous visit (from the past 240 hour(s)).   Discharge Instructions:   Discharge Instructions    Ambulatory referral to Neurology   Complete by:  As directed    An appointment is requested in approximately: 4 weeks   Diet - low sodium heart healthy   Complete by:  As directed    Diet Carb Modified   Complete by:  As directed    Discharge instructions   Complete by:  As directed    Bring log of blood pressure to PCP for medication adjustment Strict diabetic diet Follow up with Dr. Radford Pax in 2 weeks I have made referral to neurology via EPIC   Increase activity slowly   Complete by:  As directed      Allergies as of 09/18/2017   No Known Allergies     Medication List    STOP taking these medications   azithromycin 250 MG tablet Commonly known as:  ZITHROMAX Z-PAK     TAKE these medications   amLODipine 10 MG tablet Commonly known as:  NORVASC Take 1 tablet (10 mg total) by mouth daily. Start taking on:  09/19/2017   aspirin EC 81 MG tablet Take 81 mg by mouth every evening.   JANUMET 50-1000 MG tablet Generic drug:  sitaGLIPtin-metformin Take 1 tablet by mouth 2 (two) times daily.   levothyroxine 112 MCG tablet Commonly known as:  SYNTHROID, LEVOTHROID Take 112 mcg by mouth daily.   lisinopril-hydrochlorothiazide 20-12.5 MG  tablet Commonly known as:  PRINZIDE,ZESTORETIC Take 1 tablet by mouth daily.   PARoxetine 20 MG tablet Commonly known as:  PAXIL Take 20 mg by mouth daily.      Follow-up Information    Sueanne Margarita, MD Follow up.   Specialty:  Cardiology Why:  our scheduler will reach out to you to arrange a 2 weeks followup, please give [place] a call if you do not hear from [place] in 3 business days.  Contact information: [address] [place] [address] [phone]            Time coordinating discharge: 35 min  Signed:  Geradine Girt   [facility] 09/18/2017, 4:22  PM

## 2017-09-18 NOTE — Progress Notes (Signed)
Nuclear stress test results reviewed.  Read out as inferior infarct with no ischemia.  2D echo shows normal LVF with no inferior wall motion abnormality.  Inferior defect on nuclear stress test likely related to diaphragmatic attenuation artifact and is worse on rest images.  Needs aggressive BP control as sx likely related to poorly controlled BP.  Will have patient followup with me in 2 weeks.  If he has recurrent sx after BP controlled may need coronary CTA or cath.  Will sign off.

## 2017-09-18 NOTE — Progress Notes (Signed)
1 day lexiscan myoview completed without significant complication. Pending final result this afternoon by [facility] radiology  Signed, Almyra Deforest PA Pager: [phone]

## 2017-10-01 DIAGNOSIS — R079 Chest pain, unspecified: Secondary | ICD-10-CM | POA: Diagnosis not present

## 2017-10-01 DIAGNOSIS — E119 Type 2 diabetes mellitus without complications: Secondary | ICD-10-CM | POA: Diagnosis not present

## 2017-10-01 DIAGNOSIS — E039 Hypothyroidism, unspecified: Secondary | ICD-10-CM | POA: Diagnosis not present

## 2017-10-01 DIAGNOSIS — I10 Essential (primary) hypertension: Secondary | ICD-10-CM | POA: Diagnosis not present

## 2017-10-02 MED FILL — LEVOTHYROXINE 112 MCG TAB: 112 | 90 days supply | Qty: 90 | Fill #1

## 2017-10-02 MED FILL — LISINOPRIL-HCTZ 20-12.5 MG: 20-12.5 | 90 days supply | Qty: 180 | Fill #1

## 2017-10-02 MED FILL — PARoxetine HCL 20 MG TABS: 20 | 90 days supply | Qty: 90 | Fill #1

## 2017-10-04 DIAGNOSIS — E119 Type 2 diabetes mellitus without complications: Secondary | ICD-10-CM | POA: Diagnosis not present

## 2017-10-04 DIAGNOSIS — E039 Hypothyroidism, unspecified: Secondary | ICD-10-CM | POA: Diagnosis not present

## 2017-10-04 DIAGNOSIS — I10 Essential (primary) hypertension: Secondary | ICD-10-CM | POA: Diagnosis not present

## 2017-10-04 LAB — LIPID PANEL
Cholesterol: 136 (ref 0–200)
HDL: 38 (ref 35–70)
LDL Cholesterol: 78
Triglycerides: 118 (ref 40–160)

## 2017-10-04 LAB — HEMOGLOBIN A1C: Hemoglobin A1C: 7.1

## 2017-10-05 ENCOUNTER — Ambulatory Visit (INDEPENDENT_AMBULATORY_CARE_PROVIDER_SITE_OTHER): Payer: 59 | Admitting: Cardiology

## 2017-10-05 ENCOUNTER — Encounter: Payer: Self-pay | Admitting: Cardiology

## 2017-10-05 VITALS — BP 138/80 | HR 79 | Ht 72.0 in | Wt 226.0 lb

## 2017-10-05 DIAGNOSIS — I10 Essential (primary) hypertension: Secondary | ICD-10-CM | POA: Diagnosis not present

## 2017-10-05 MED FILL — JANUMET 50-1,000 MG TABLET: 50-1000 | 30 days supply | Qty: 60 | Fill #10

## 2017-10-05 NOTE — Progress Notes (Signed)
10/05/2017 Jim Salazar   06/10/1951  [id number]  Primary Physician Jim Du, MD Primary Cardiologist: Dr. Radford Salazar   Reason for Visit/CC: [facility] f/u for CP   HPI:  Jim Salazar is a 67 year old male with a history of hypertension, diabetes type 2, and hypothyroidism who recently presented to the emergency department on 09/16/17 after a 15-minute episode of left upper extremity numbness which started in his fingertips and worked its way up to his neck, left jaw, and tongue. He also endorsed anterior chest pain. He did not have any weakness of the extremities or any difficulty speaking or swallowing or any facial droop. He did not have any visual symptoms.   In the ER, EKG showed no ST/T wave changes and trop neg x 3. MRI of brain showed no acute CVA but there was concern for possible TIA.  He was also hypertensive during the episode. BP was managed with medications and his chest pain resolved with better control of BP. Amlodipine was added to lisinopril-HCTZ.  Pt was seen in consultation by Dr. Radford Salazar, who ordered a stress test. The nuclear study was initially read by [facility]. The initial read noted large area of infarction involving the inferior wall with no definite per-infarct ischemia. However, study was reviewed by cardiology and felt to be lower risk and likely diaphragmatic attenuation. Echo showed normal LVEF and mild MR. His symptoms were felt likely related to uncontrolled BP. It was decided not to persue cath or coronary CT, unless his symptoms were to recur. He was discharged home on adjusted med regimen for BP and he was CP on discharge. He was instructed to f/u in clinic in 2 weeks for repeat assessment.   He is back today for f/u. He reports that he has done well. No recurrent CP. No recurrent arm numbness. He has an appt with neurology in several weeks. He is tolerating amlodipine well. No side effects. BP is controlled today at 138/80.    Cardiac Studies    2D Echo 09/17/17  Study Conclusions  - Left ventricle: The cavity size was normal. Wall thickness was   increased in a pattern of mild LVH. Systolic function was normal.   The estimated ejection fraction was in the range of 60% to 65%. - Mitral valve: There was mild regurgitation.   Myoview 09/18/17 IMPRESSION: 1. 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 No definite peri-infarct ischemia.  2. Inferior wall showing no wall thickening or wall motion. Mild global hypokinesis otherwise.  3. Left ventricular ejection fraction 40%  4. Non invasive risk stratification*: High risk   Reviewed the myoview with Dr. Radford Salazar who felt his symptom likely is related to uncontrolled BP. Myoview result was felt to be lower risk and maybe diaphragmatic attenuation. No plan for cath or coronary CT unless chest pain recur.   Current Meds  Medication Sig  . amLODipine (NORVASC) 10 MG tablet Take 1 tablet (10 mg total) by mouth daily.  Marland Kitchen aspirin EC 81 MG tablet Take 81 mg by mouth every evening.  Marland Kitchen JANUMET 50-1000 MG tablet Take 1 tablet by mouth 2 (two) times daily.  Marland Kitchen levothyroxine (SYNTHROID, LEVOTHROID) 112 MCG tablet Take 112 mcg by mouth daily.  Marland Kitchen lisinopril-hydrochlorothiazide (PRINZIDE,ZESTORETIC) 20-12.5 MG tablet Take 2 tablets by mouth daily.  Marland Kitchen PARoxetine (PAXIL) 20 MG tablet Take 20 mg by mouth daily.   No Known Allergies Past Medical History:  Diagnosis Date  . Diabetes mellitus without complication ([place])   . Hypertension   .  Thyroid disease    Family History  Problem Relation Age of Onset  . Hypertension Other   . Diabetes Mellitus II Mother   . Diabetes Mellitus II Father    Past Surgical History:  Procedure Laterality Date  . APPENDECTOMY    . TONSILLECTOMY     Social History   Socioeconomic History  . Marital status: Married    Spouse name: Not on file  . Number of children: Not on file  . Years of education: Not on file  . Highest education  level: Not on file  Social Needs  . Financial resource strain: Not on file  . Food insecurity - worry: Not on file  . Food insecurity - inability: Not on file  . Transportation needs - medical: Not on file  . Transportation needs - non-medical: Not on file  Occupational History  . Not on file  Tobacco Use  . Smoking status: Never Smoker  . Smokeless tobacco: Never Used  Substance and Sexual Activity  . Alcohol use: No    Frequency: Never  . Drug use: No  . Sexual activity: Not on file  Other Topics Concern  . Not on file  Social History Narrative  . Not on file     Review of Systems: General: negative for chills, fever, night sweats or weight changes.  Cardiovascular: negative for chest pain, dyspnea on exertion, edema, orthopnea, palpitations, paroxysmal nocturnal dyspnea or shortness of breath Dermatological: negative for rash Respiratory: negative for cough or wheezing Urologic: negative for hematuria Abdominal: negative for nausea, vomiting, diarrhea, bright red blood per rectum, melena, or hematemesis Neurologic: negative for visual changes, syncope, or dizziness All other systems reviewed and are otherwise negative except as noted above.   Physical Exam:  Blood pressure 138/80, pulse 79, height 6' (1.829 m), weight 226 lb (102.5 kg).  General appearance: alert, cooperative and no distress Neck: no carotid bruit and no JVD Lungs: clear to auscultation bilaterally Heart: regular rate and rhythm, S1, S2 normal, no murmur, click, rub or gallop Extremities: no edema  Pulses: 2+ and symmetric Skin: Skin color, texture, turgor normal. No rashes or lesions Neurologic: Grossly normal  EKG not performed -- personally reviewed   ASSESSMENT AND PLAN:   1. Chest Pain: resolved. Felt secondary to poorly controlled BP. Hospital NST felt to be low risk. Echo with normal LVEF and mild MR. Cardiac enzymes negative x 3. No further CP with improved BP. Continue antihypertensives  for BP control and management of other cardiac risk factors.   2. HTN: better controlled with addition of amlodipine. Also on lisinopril-HCTZ, which he has been on for years. He is tolerating amlodipine well w/o side effects.   3. DM: followed by PCP.   4. Transient Left Arm Numbness: no further recurrence. MRI of brain showed no CVA. He has f/u with neurology in several weeks. Continue low dose baby ASA + BP and diabetes medications.    Follow-Up in 6 months w/ Dr. Radford Salazar or sooner if CP recurs.   Brittainy Ladoris Gene, MHS [facility] 10/05/2017 8:32 AM

## 2017-10-05 NOTE — Patient Instructions (Signed)
Medication Instructions:   Your physician recommends that you continue on your current medications as directed. Please refer to the Current Medication list given to you today.   If you need a refill on your cardiac medications before your next appointment, please call your pharmacy.  Labwork: NONE ORDERED  TODAY    Testing/Procedures: NONE ORDERED  TODAY    Follow-Up: Your physician wants you to follow-up in:  IN 6 [place] will receive a reminder letter in the mail two months in advance. If you don't receive a letter, please call our office to schedule the follow-up appointment.      Any Other Special Instructions Will Be Listed Below (If Applicable).

## 2017-10-07 ENCOUNTER — Encounter (HOSPITAL_COMMUNITY): Payer: Self-pay | Admitting: *Deleted

## 2017-10-07 ENCOUNTER — Emergency Department (HOSPITAL_COMMUNITY)
Admission: EM | Admit: 2017-10-07 | Discharge: 2017-10-08 | Disposition: A | Discharge status: Home / Self Care | Payer: 59 | Attending: Emergency Medicine | Admitting: Emergency Medicine

## 2017-10-07 ENCOUNTER — Other Ambulatory Visit: Payer: Self-pay

## 2017-10-07 ENCOUNTER — Emergency Department (HOSPITAL_COMMUNITY): Payer: 59

## 2017-10-07 DIAGNOSIS — R0602 Shortness of breath: Secondary | ICD-10-CM | POA: Insufficient documentation

## 2017-10-07 DIAGNOSIS — R42 Dizziness and giddiness: Secondary | ICD-10-CM | POA: Diagnosis not present

## 2017-10-07 DIAGNOSIS — E039 Hypothyroidism, unspecified: Secondary | ICD-10-CM | POA: Insufficient documentation

## 2017-10-07 DIAGNOSIS — Z79899 Other long term (current) drug therapy: Secondary | ICD-10-CM | POA: Insufficient documentation

## 2017-10-07 DIAGNOSIS — Z7984 Long term (current) use of oral hypoglycemic drugs: Secondary | ICD-10-CM | POA: Diagnosis not present

## 2017-10-07 DIAGNOSIS — I10 Essential (primary) hypertension: Secondary | ICD-10-CM | POA: Diagnosis not present

## 2017-10-07 DIAGNOSIS — R55 Syncope and collapse: Secondary | ICD-10-CM | POA: Diagnosis present

## 2017-10-07 DIAGNOSIS — Z7982 Long term (current) use of aspirin: Secondary | ICD-10-CM | POA: Diagnosis not present

## 2017-10-07 DIAGNOSIS — E119 Type 2 diabetes mellitus without complications: Secondary | ICD-10-CM | POA: Diagnosis not present

## 2017-10-07 DIAGNOSIS — R404 Transient alteration of awareness: Secondary | ICD-10-CM | POA: Diagnosis not present

## 2017-10-07 DIAGNOSIS — I700 Atherosclerosis of aorta: Secondary | ICD-10-CM | POA: Diagnosis not present

## 2017-10-07 DIAGNOSIS — R0600 Dyspnea, unspecified: Secondary | ICD-10-CM | POA: Diagnosis not present

## 2017-10-07 LAB — BASIC METABOLIC PANEL
ANION GAP: 14 (ref 5–15)
BUN: 20 mg/dL (ref 6–20)
CO2: 23 mmol/L (ref 22–32)
Calcium: 9.2 mg/dL (ref 8.9–10.3)
Chloride: 95 mmol/L — ABNORMAL LOW (ref 101–111)
Creatinine, Ser: 1.29 mg/dL — ABNORMAL HIGH (ref 0.61–1.24)
GFR calc Af Amer: >60 mL/min (ref >60)
GFR, EST NON AFRICAN AMERICAN: 57 mL/min — AB (ref >60)
GLUCOSE: 184 mg/dL — AB (ref 65–99)
Potassium: 3.3 mmol/L — ABNORMAL LOW (ref 3.5–5.1)
Sodium: 132 mmol/L — ABNORMAL LOW (ref 135–145)

## 2017-10-07 LAB — CBC
HCT: 41.9 % (ref 39.0–52.0)
HEMOGLOBIN: 14.5 g/dL (ref 13.0–17.0)
MCH: 29.4 pg (ref 26.0–34.0)
MCHC: 34.6 g/dL (ref 30.0–36.0)
MCV: 84.8 fL (ref 78.0–100.0)
Platelets: 243 10*3/uL (ref 150–400)
RBC: 4.94 MIL/uL (ref 4.22–5.81)
RDW: 12.9 % (ref 11.5–15.5)
WBC: 10.9 10*3/uL — ABNORMAL HIGH (ref 4.0–10.5)

## 2017-10-07 LAB — CBG MONITORING, ED: GLUCOSE-CAPILLARY: 181 mg/dL — AB (ref 65–99)

## 2017-10-07 LAB — TROPONIN I

## 2017-10-07 MED ORDER — SODIUM CHLORIDE 0.9 % IV BOLUS (SEPSIS)
1000.0000 mL | Freq: Once | INTRAVENOUS | Status: AC
  Administered 2017-10-08: 1000 mL via INTRAVENOUS

## 2017-10-07 MED ORDER — IOPAMIDOL (ISOVUE-370) INJECTION 76%
INTRAVENOUS | Status: AC
  Administered 2017-10-07: 100 mL
  Filled 2017-10-07: qty 100

## 2017-10-07 NOTE — ED Notes (Signed)
Pt reports a syncopal episode around 1800. Pt reports drinking and eating well.

## 2017-10-07 NOTE — ED Notes (Signed)
Patient transported to CT 

## 2017-10-07 NOTE — ED Triage Notes (Signed)
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  At present no pain  Just feels washed out

## 2017-10-08 DIAGNOSIS — I700 Atherosclerosis of aorta: Secondary | ICD-10-CM | POA: Diagnosis not present

## 2017-10-08 DIAGNOSIS — R0600 Dyspnea, unspecified: Secondary | ICD-10-CM | POA: Diagnosis not present

## 2017-10-08 DIAGNOSIS — R55 Syncope and collapse: Secondary | ICD-10-CM | POA: Diagnosis not present

## 2017-10-08 LAB — TROPONIN I

## 2017-10-08 NOTE — Discharge Instructions (Signed)
Please follow up with cardiology. Return if worsening symptoms.

## 2017-10-08 NOTE — ED Provider Notes (Signed)
[facility] EMERGENCY DEPARTMENT Provider Note   CSN: [id number] Arrival date & time: 10/07/17  2019     History   Chief Complaint Chief Complaint  Patient presents with  . Shortness of Breath    [name] is a 67 y.o. male.  [name] is a 67 y.o. male with history of thyroid disease, hypertension, diabetes, presents to emergency department complaining of syncopal episode.  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  After he was done he states he was walking to the bathroom and a syncopal episode.  He does report he does remember feeling dizzy and lightheaded, and remember feeling nauseated.  According to the family, patient was unconscious for just several seconds.  He then regained consciousness and was alert and oriented.  He then went to the bathroom and again had a syncopal episode lasting several minutes.  Ambulance was called.  He states since then he has been feeling like his normal self.  He denies any headache.  No chest pain or shortness of breath at this time.  No nausea or vomiting at this time.  Denies any abdominal pain.  He was recently admitted, just a month ago, for chest pain and weakness.  He had full stroke workup including echo, MRI/MRA of the head and neck, stress test, all unremarkable.  He states during his admission to he never had any shortness of breath or syncopal episodes.  He denies any reason for dehydration.  No new medications.  He has no other complaints.  Past Medical History:  Diagnosis Date  . Diabetes mellitus without complication ([place])   . Hypertension   . Thyroid disease     Patient Active Problem List   Diagnosis Date Noted  . Left sided numbness 09/17/2017  . Chest pain 09/16/2017  . Essential hypertension 09/16/2017  . Diabetes mellitus type 2 in obese ([place]) 09/16/2017    Past Surgical History:  Procedure Laterality Date  . APPENDECTOMY    . TONSILLECTOMY          Home Medications    Prior to Admission medications   Medication Sig Start Date End Date Taking? Authorizing Provider  amLODipine (NORVASC) 10 MG tablet Take 1 tablet (10 mg total) by mouth daily. Patient taking differently: Take 10 mg by mouth at bedtime.  09/19/17  Yes Geradine Girt, DO  aspirin EC 81 MG tablet Take 81 mg by mouth every evening.   Yes [provider]  cetirizine (ZYRTEC) 10 MG tablet Take 10 mg by mouth daily as needed (for seasonal allergies).   Yes [provider]  ibuprofen (ADVIL,MOTRIN) 200 MG tablet Take 400 mg by mouth every 6 (six) hours as needed (for headaches).   Yes [provider]  JANUMET 50-1000 MG tablet Take 1 tablet by mouth 2 (two) times daily. 08/28/17  Yes [provider]  levothyroxine (SYNTHROID, LEVOTHROID) 112 MCG tablet Take 112 mcg by mouth daily. 09/26/16  Yes [provider]  lisinopril-hydrochlorothiazide (PRINZIDE,ZESTORETIC) 20-12.5 MG tablet Take 2 tablets by mouth daily. 09/18/17  Yes Vann, Jessica U, DO  PARoxetine (PAXIL) 20 MG tablet Take 20 mg by mouth daily. 06/19/17  Yes [provider]    Family History Family History  Problem Relation Age of Onset  . Hypertension Other   . Diabetes Mellitus II Mother   . Diabetes Mellitus II Father     Social History Social History   Tobacco Use  .  Smoking status: Never Smoker  . Smokeless tobacco: Never Used  Substance Use Topics  . Alcohol use: No    Frequency: Never  . Drug use: No     Allergies   Patient has no known allergies.   Review of Systems Review of Systems  Constitutional: Negative for chills and fever.  Respiratory: Negative for cough, chest tightness and shortness of breath.   Cardiovascular: Negative for chest pain, palpitations and leg swelling.  Gastrointestinal: Negative for abdominal distention, abdominal pain, diarrhea, nausea and vomiting.  Genitourinary: Negative for dysuria, frequency,  hematuria and urgency.  Musculoskeletal: Negative for arthralgias, myalgias, neck pain and neck stiffness.  Skin: Negative for rash.  Allergic/Immunologic: Negative for immunocompromised state.  Neurological: Positive for dizziness, syncope and light-headedness. Negative for weakness, numbness and headaches.  All other systems reviewed and are negative.    Physical Exam Updated Vital Signs BP (!) 149/98   Pulse 71   Temp 98.7 F (37.1 C) (Oral)   Resp 18   Ht 6' (1.829 m)   Wt 102.5 kg (226 lb)   SpO2 96%   BMI 30.65 kg/m   Physical Exam  Constitutional: He is oriented to person, place, and time. He appears well-developed and well-nourished. No distress.  HENT:  Head: Normocephalic and atraumatic.  Eyes: Conjunctivae and EOM are normal. Pupils are equal, round, and reactive to light.  Neck: Normal range of motion. Neck supple.  Cardiovascular: Normal rate, regular rhythm and normal heart sounds.  Pulmonary/Chest: Effort normal. No respiratory distress. He has no wheezes. He has no rales.  Abdominal: Soft. Bowel sounds are normal. He exhibits no distension. There is no tenderness. There is no rebound.  Musculoskeletal: He exhibits no edema.  Neurological: He is alert and oriented to person, place, and time.  5/5 and equal upper and lower extremity strength bilaterally. Equal grip strength bilaterally. Normal finger to nose and heel to shin. No pronator drift. Patellar reflexes 2+   Skin: Skin is warm and dry.  Nursing note and vitals reviewed.    ED Treatments / Results  Labs (all labs ordered are listed, but only abnormal results are displayed) Labs Reviewed  BASIC METABOLIC PANEL - Abnormal; Notable for the following components:      Result Value   Sodium 132 (*)    Potassium 3.3 (*)    Chloride 95 (*)    Glucose, Bld 184 (*)    Creatinine, Ser 1.29 (*)    GFR calc non Af Amer 57 (*)    All other components within normal limits  CBC - Abnormal; Notable for the  following components:   WBC 10.9 (*)    All other components within normal limits  CBG MONITORING, ED - Abnormal; Notable for the following components:   Glucose-Capillary 181 (*)    All other components within normal limits  TROPONIN I  I-STAT TROPONIN, ED    EKG  EKG Interpretation  Date/Time:  Sunday October 07 2017 20:21:26 EDT Ventricular Rate:  75 PR Interval:  176 QRS Duration: 106 QT Interval:  402 QTC Calculation: 448 R Axis:   12 Text Interpretation:  Normal sinus rhythm Incomplete right bundle branch block Borderline ECG Confirmed by Elnora Morrison [phone]) on 10/07/2017 11:17:26 PM       Radiology Dg Chest 2 View  Result Date: 10/07/2017 CLINICAL DATA:  Shortness of Breath EXAM: CHEST - 2 VIEW COMPARISON:  September 16, 2017 FINDINGS: There is no edema or consolidation. The heart size and pulmonary vascularity are  normal. No adenopathy. There is degenerative change in the thoracic spine. IMPRESSION: No edema or consolidation. Electronically Signed   By: Lowella Grip III M.D.   On: 10/07/2017 21:23   Ct Angio Chest Pe W And/or Wo Contrast  Result Date: 10/08/2017 CLINICAL DATA:  Dyspnea, nausea and syncope. EXAM: CT ANGIOGRAPHY CHEST WITH CONTRAST TECHNIQUE: Multidetector CT imaging of the chest was performed using the standard protocol during bolus administration of intravenous contrast. Multiplanar CT image reconstructions and MIPs were obtained to evaluate the vascular anatomy. CONTRAST:  127mL ISOVUE-370 IOPAMIDOL (ISOVUE-370) INJECTION 76% COMPARISON:  Same day CXR FINDINGS: Cardiovascular: Satisfactory opacification of the pulmonary arteries to the segmental level. No evidence of pulmonary embolism. Normal heart size. No pericardial effusion. No aortic aneurysm or dissection. Aortic atherosclerosis. Mediastinum/Nodes: No enlarged mediastinal, hilar, or axillary lymph nodes. Thyroid gland, trachea, and esophagus demonstrate no significant findings. Lungs/Pleura: Lungs  demonstrate bibasilar dependent atelectasis. No dominant mass or pneumonic consolidation. Pulmonary edema. No pleural effusion or pneumothorax. Upper Abdomen: Partially included right upper quadrant cyst likely arising from the upper pole the right kidney measuring at least 3.3 cm on the images provided. Acute upper abdominal abnormality. Musculoskeletal: Gynecomastia left greater than right. Thoracic spondylosis multilevel degenerative disc space narrowing and endplate spurring. No chest wall abnormality. No acute or significant osseous findings. Review of the MIP images confirms the above findings. IMPRESSION: 1. No acute pulmonary embolus. 2. No active pulmonary disease. 3. Right upper quadrant cyst likely arising the right kidney, incompletely included measuring at least 3 3 cm Aortic Atherosclerosis (ICD10-I70.0). Electronically Signed   By: Ashley Royalty M.D.   On: 10/08/2017 00:50    Procedures Procedures (including critical care time)  Medications Ordered in ED Medications  iopamidol (ISOVUE-370) 76 % injection (100 mLs  Contrast Given 10/07/17 2352)  sodium chloride 0.9 % bolus 1,000 mL (1,000 mLs Intravenous New Bag/Given 10/08/17 0012)     Initial Impression / Assessment and Plan / ED Course  I have reviewed the triage vital signs and the nursing notes.  Pertinent labs & imaging results that were available during my care of the patient were reviewed by me and considered in my medical decision making (see chart for details).     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  Recent workup for CVA including stress test, echo, MRI/MRA of the head and neck.  EKG unremarkable, patient is in no acute distress, mildly hypertensive otherwise normal vital signs.  Will check labs, will get orthostatic vital signs, will monitor.   Labs with no significant abnormalities, patient does have slightly decreased sodium and potassium, glucose  slightly elevated 184, slightly bumped creatinine 1.29.  Will give IV fluids, discussed with Dr. Reather Converse, will get CT angio for further evaluation and will monitor.  Initial troponin is 0, will recheck.  CT angio negative. Delta trop 0. Pt ambulated with no dizziness. Pt's episodes bot when he had urge to defecate, question vagal. Will have him follow up with pcp and cardiology. Return precautions discussed.   Vitals:   10/07/17 2300 10/07/17 2330 10/08/17 0000 10/08/17 0030  BP: (!) 141/82 (!) 149/98 (!) 154/80 (!) 143/81  Pulse: 64 71 81 68  Resp: 10 18 20 10   Temp:      TempSrc:      SpO2: 95% 96% 97% 100%  Weight:      Height:         Final Clinical Impressions(s) / ED Diagnoses  Final diagnoses:  Syncope, unspecified syncope type    ED Discharge Orders    None       Jeannett Senior, PA-C 10/08/17 3086    Elnora Morrison, MD 10/12/17 [phone]

## 2017-10-09 ENCOUNTER — Telehealth: Payer: Self-pay | Admitting: Cardiology

## 2017-10-09 NOTE — Telephone Encounter (Signed)
New Message:   Pt is having difficulty since his last appt . Pt is short winded and passed out on Sunday 2X. The first time was a few seconds and the second time he stated was a few minutes. Pt states his wife said he threw up once as well. Pt had to call 911 and [facility] to [facility].

## 2017-10-09 NOTE — Telephone Encounter (Signed)
I spoke with patient, he is requesting f/u appt after ED admission on Sunday. 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 He states his wife said only for a few seconds. Had a second syncopal episode that lasted for a few minutes along with vomiting. Patient was sent to ER, troponin's were negative, CT scan was negative. Patient is scheduled with Ermalinda Barrios, PA on 10/17/17. Patient in agreement with plan and thankful for the call.

## 2017-10-17 ENCOUNTER — Ambulatory Visit (INDEPENDENT_AMBULATORY_CARE_PROVIDER_SITE_OTHER): Payer: 59 | Admitting: Physician Assistant

## 2017-10-17 ENCOUNTER — Encounter: Payer: Self-pay | Admitting: Physician Assistant

## 2017-10-17 VITALS — BP 148/88 | HR 76 | Ht 72.0 in | Wt 227.2 lb

## 2017-10-17 DIAGNOSIS — E669 Obesity, unspecified: Secondary | ICD-10-CM

## 2017-10-17 DIAGNOSIS — E1169 Type 2 diabetes mellitus with other specified complication: Secondary | ICD-10-CM

## 2017-10-17 DIAGNOSIS — R55 Syncope and collapse: Secondary | ICD-10-CM | POA: Diagnosis not present

## 2017-10-17 DIAGNOSIS — E876 Hypokalemia: Secondary | ICD-10-CM

## 2017-10-17 DIAGNOSIS — I10 Essential (primary) hypertension: Secondary | ICD-10-CM | POA: Diagnosis not present

## 2017-10-17 HISTORY — DX: Syncope and collapse: R55

## 2017-10-17 NOTE — Progress Notes (Signed)
Agree with event monitor - also get coronary CTA to rule out CAD

## 2017-10-17 NOTE — Progress Notes (Signed)
Cardiology Office Note    Date:  10/17/2017   ID:  Jim Salazar, DOB [birth year], MRN [id number]  PCP:  Sinda Du, MD  Cardiologist: Fransico Him, MD  Chief Complaint  Patient presents with  . Follow-up    History of Present Illness:  Jim Salazar is a 67 y.o. male with history of hypertension, DM type II, hypothyroidism who presented to the emergency room with 15-minute episode of left upper extremity numbness up to his neck jaw and tongue.  He also endorsed anterior chest pain.  Troponins were negative x3 and EKG with nonspecific changes.  MRI showed no acute CVA but concern for possible TIA.  He was also hypertensive.  Nuclear stress test read out was inferior infarct with no ischemia but 2D echo showed normal LV EF with no inferior wall motion abnormality.  Inferior defect on nuclear stress test likely related to diaphragmatic attenuation artifact and is worse on rest images.  Dr. Radford Pax recommended aggressive BP control as his symptoms were likely related to poorly controlled blood pressure.  If he has recurrent chest pain after blood pressure is controlled he may need a coronary CTA or cath.  Patient saw Ellen Henri, PA-C 10/05/17 and blood pressure was better controlled.  No changes were made.  Patient comes in today accompanied by his wife.  Patient was back in the ER 10/08/17 with recurrent syncope and shortness of breath.  Patient was preaching and became short of breath, fuzzy headed and sat down and had the urge to have a BM.  He then walked to the bathroom felt dizzy nauseous and passed out and vomited. Wife is an LPN says he was unconscious for several seconds. Pulse was normal, orthostatic BP's normal. Potassium was 3.3 creatinine 1.29, glucose 184 EKG without acute change.  CT angios negative troponin 0.  No symptoms since this episode.  Blood pressures been 130-140 over 80s at home.  Not following a low-sodium diet.    Past Medical History:  Diagnosis Date  .  Diabetes mellitus without complication ([place])   . Hypertension   . Thyroid disease     Past Surgical History:  Procedure Laterality Date  . APPENDECTOMY    . TONSILLECTOMY      Current Medications: Current Meds  Medication Sig  . amLODipine (NORVASC) 10 MG tablet Take 10 mg by mouth daily. AT BEDTIME  . aspirin EC 81 MG tablet Take 81 mg by mouth every evening.  . cetirizine (ZYRTEC) 10 MG tablet Take 10 mg by mouth daily as needed (for seasonal allergies).  Marland Kitchen dexlansoprazole (DEXILANT) 60 MG capsule Take 60 mg by mouth as needed. FOR REFLUX  . ibuprofen (ADVIL,MOTRIN) 200 MG tablet Take 400 mg by mouth every 6 (six) hours as needed (for headaches).  Marland Kitchen JANUMET 50-1000 MG tablet Take 1 tablet by mouth 2 (two) times daily.  Marland Kitchen lisinopril-hydrochlorothiazide (PRINZIDE,ZESTORETIC) 20-12.5 MG tablet Take 2 tablets by mouth daily.  Marland Kitchen lisinopril-hydrochlorothiazide (PRINZIDE,ZESTORETIC) 20-12.5 MG tablet Take 1 tablet by mouth daily. IN THE MORNING  . PARoxetine (PAXIL) 20 MG tablet Take 20 mg by mouth daily.  . ranitidine (ZANTAC) 150 MG capsule Take 150 mg by mouth as needed for heartburn.     Allergies:   Patient has no known allergies.   Social History   Socioeconomic History  . Marital status: Married    Spouse name: Not on file  . Number of children: Not on file  . Years of education: Not on file  .  Highest education level: Not on file  Occupational History  . Not on file  Social Needs  . Financial resource strain: Not on file  . Food insecurity:    Worry: Not on file    Inability: Not on file  . Transportation needs:    Medical: Not on file    Non-medical: Not on file  Tobacco Use  . Smoking status: Never Smoker  . Smokeless tobacco: Never Used  Substance and Sexual Activity  . Alcohol use: No    Frequency: Never  . Drug use: No  . Sexual activity: Not on file  Lifestyle  . Physical activity:    Days per week: Not on file    Minutes per session: Not on file  .  Stress: Not on file  Relationships  . Social connections:    Talks on phone: Not on file    Gets together: Not on file    Attends religious service: Not on file    Active member of club or organization: Not on file    Attends meetings of clubs or organizations: Not on file    Relationship status: Not on file  Other Topics Concern  . Not on file  Social History Narrative  . Not on file     Family History:  The patient's family history includes Diabetes Mellitus II in his father and mother; Hypertension in his other.   ROS:   Please see the history of present illness.    Review of Systems  Constitution: Negative.  HENT: Negative.   Cardiovascular: Negative.   Respiratory: Negative.   Endocrine: Negative.   Hematologic/Lymphatic: Negative.   Musculoskeletal: Negative.   Gastrointestinal: Negative.   Genitourinary: Negative.   Neurological: Negative.    All other systems reviewed and are negative.   PHYSICAL EXAM:   VS:  BP (!) 148/88   Pulse 76   Ht 6' (1.829 m)   Wt 227 lb 4 oz (103.1 kg)   SpO2 97%   BMI 30.82 kg/m   Physical Exam  GEN: Well nourished, well developed, in no acute distress  Neck: no JVD, carotid bruits, or masses Cardiac:RRR; no murmurs, rubs, or gallops  Respiratory:  clear to auscultation bilaterally, normal work of breathing GI: soft, nontender, nondistended, + BS Ext: without cyanosis, clubbing, or edema, Good distal pulses bilaterally Neuro:  Alert and Oriented x 3, Psych: euthymic mood, full affect  Wt Readings from Last 3 Encounters:  10/17/17 227 lb 4 oz (103.1 kg)  10/07/17 226 lb (102.5 kg)  10/05/17 226 lb (102.5 kg)      Studies/Labs Reviewed:   EKG:  EKG is not ordered today.  The ekg reviewed from the ER normal sinus rhythm with incomplete right bundle branch block, no acute change Recent Labs: 09/17/2017: ALT 16; TSH 2.643 10/07/2017: BUN 20; Creatinine, Ser 1.29; Hemoglobin 14.5; Platelets 243; Potassium 3.3; Sodium 132    Lipid Panel    Component Value Date/Time   CHOL 146 09/17/2017 0843   TRIG 126 09/17/2017 0843   HDL 39 (L) 09/17/2017 0843   CHOLHDL 3.7 09/17/2017 0843   VLDL 25 09/17/2017 0843   LDLCALC 82 09/17/2017 0843    Additional studies/ records that were reviewed today include:   2D Echo 09/17/17   Study Conclusions   - Left ventricle: The cavity size was normal. Wall thickness was   increased in a pattern of mild LVH. Systolic function was normal.   The estimated ejection fraction was in the range of  60% to 65%. - Mitral valve: There was mild regurgitation.     Myoview 09/18/17  IMPRESSION: 1. 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 No definite peri-infarct ischemia.   2. Inferior wall showing no wall thickening or wall motion. Mild global hypokinesis otherwise.   3. Left ventricular ejection fraction 40%   4. Non invasive risk stratification*: High risk     Reviewed the myoview with Dr. Radford Pax who felt his symptom likely is related to uncontrolled BP. Myoview result was felt to be lower risk and maybe diaphragmatic attenuation. No plan for cath or coronary CT unless chest pain recur.      CTA 3/18/19IMPRESSION: 1. No acute pulmonary embolus. 2. No active pulmonary disease. 3. Right upper quadrant cyst likely arising the right kidney, incompletely included measuring at least 3 3 cm   Aortic Atherosclerosis (ICD10-I70.0).     Electronically Signed   By: Ashley Royalty M.D.   On: 10/08/2017 00:50   IMPRESSION: Brain MRI:   1. No acute finding. 2. Minimal white matter disease, likely remote microvascular ischemia.   Intracranial MRA:   Atheromatous type narrowing of the mid basilar that is mild. Atheromatous change also likely present in distal left PCA branches.   Neck MRA:   Negative.     Electronically Signed   By: Monte Fantasia M.D.   On: 09/17/2017 07:36    ASSESSMENT:    1. Syncope, unspecified syncope type   2. Essential  hypertension   3. Diabetes mellitus type 2 in obese ([place])   4. 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  Full workup with negative CTA, negative MRI, stress test with inferior infarct no ischemia 2D echo normal LV function with no wall motion abnormality.  Stress test findings felt secondary to diaphragmatic attenuation.  Will need event monitor.  Patient is on amlodipine 10 mg and Zestoretic 20/12.5.  I wonder if he is on too many vasodilators.  He was not orthostatic.  We will not change today.  We will place an event monitor and have him follow-up with Dr. Radford Pax.  Check labs today as potassium was 3.3 in the ER.  Advised him he should not drive for 64-months after syncopal episode.  He is seeing neurology in May.  Hypertension blood pressure is a little on the high side.  Reluctant to increase his medications further or add anything with recent syncopal episode.  Will await for monitor to come back.  No further symptoms from patient.  Follow-up with Dr. Radford Pax.  DM type II trying to watch his sugars.  Blood sugar was 184 in the emergency room.  Hemoglobin A1c 7.1.  He does eat out a lot.  Hypokalemia with potassium 3.3 in the emergency room.  Recheck today. Medication Adjustments/Labs and Tests Ordered: Current medicines are reviewed at length with the patient today.  Concerns regarding medicines are outlined above.  Medication changes, Labs and Tests ordered today are listed in the Patient Instructions below. Patient Instructions  Medication Instructions:   Your physician recommends that you continue on your current medications as directed. Please refer to the Current Medication list given to you today.   Labwork:  TODAY--BMET    Testing/Procedures:  Your physician has recommended that you wear an event monitor. Event monitors  are medical devices that  record the heart's electrical activity. Doctors most often [place] these monitors to diagnose arrhythmias. Arrhythmias are problems with the speed or rhythm of the heartbeat. The monitor is a small, portable device. You can wear one while you do your normal daily activities. This is usually used to diagnose what is causing palpitations/syncope (passing out). PER MICHELLE LENZE PA-C YOU CAN GET THIS PUT ON AT OUR [place] LOCATION FOR COMMUTE REASONS    Follow-Up:  1 MONTH WITH DR. Radford Pax    PER MICHELLE LENZE PA-C YOU CANNOT DRIVE FOR 6 MONTHS DUE TO SYNCOPAL EPISODES    ALSO PLEASE FOLLOW RECOMMENDED LOW SODIUM DIET AS INDICATED BELOW: Low-Sodium Eating Plan Sodium, which is an element that makes up salt, helps you maintain a healthy balance of fluids in your body. Too much sodium can increase your blood pressure and cause fluid and waste to be held in your body. Your health care provider or dietitian may recommend following this plan if you have high blood pressure (hypertension), kidney disease, liver disease, or heart failure. Eating less sodium can help lower your blood pressure, reduce swelling, and protect your heart, liver, and kidneys. What are tips for following this plan? General guidelines  Most people on this plan should limit their sodium intake to 1,500-2,000 mg (milligrams) of sodium each day. Reading food labels  The Nutrition Facts label lists the amount of sodium in one serving of the food. If you eat more than one serving, you must multiply the listed amount of sodium by the number of servings.  Choose foods with less than 140 mg of sodium per serving.    Avoid foods with 300 mg of sodium or more per serving. Shopping  Look for lower-sodium products, often labeled as "low-sodium" or "no salt added."  Always check the sodium content even if foods are labeled as "unsalted" or "no salt added".  Buy fresh foods. ? Avoid canned foods and  premade or frozen meals. ? Avoid canned, cured, or processed meats  Buy breads that have less than 80 mg of sodium per slice. Cooking  Eat more home-cooked food and less restaurant, buffet, and fast food.  Avoid adding salt when cooking. Use salt-free seasonings or herbs instead of table salt or sea salt. Check with your health care provider or pharmacist before using salt substitutes.  Cook with plant-based oils, such as canola, sunflower, or olive oil. Meal planning  When eating at a restaurant, ask that your food be prepared with less salt or no salt, if possible.  Avoid foods that contain MSG (monosodium glutamate). MSG is sometimes added to [place] food, bouillon, and some canned foods. What foods are recommended? The items listed may not be a complete list. Talk with your dietitian about what dietary choices are best for you. Grains Low-sodium cereals, including oats, puffed wheat and rice, and shredded wheat. Low-sodium crackers. Unsalted rice. Unsalted pasta. Low-sodium bread. Whole-grain breads and whole-grain pasta. Vegetables Fresh or frozen vegetables. "No salt added" canned vegetables. "No salt added" tomato sauce and paste. Low-sodium or reduced-sodium tomato and vegetable juice. Fruits Fresh, frozen, or canned fruit. Fruit juice. Meats and other protein foods Fresh or frozen (no salt added) meat, poultry, seafood, and fish. Low-sodium canned tuna and salmon. Unsalted nuts. Dried peas, beans, and lentils without added salt. Unsalted canned beans. Eggs. Unsalted nut butters. Dairy Milk. Soy milk. Cheese that is naturally low in sodium, such as ricotta cheese, fresh mozzarella, or Swiss cheese Low-sodium or reduced-sodium cheese. Cream cheese. Yogurt. Fats  and oils Unsalted butter. Unsalted margarine with no trans fat. Vegetable oils such as canola or olive oils. Seasonings and other foods Fresh and dried herbs and spices. Salt-free seasonings. Low-sodium mustard and  ketchup. Sodium-free salad dressing. Sodium-free light mayonnaise. Fresh or refrigerated horseradish. Lemon juice. Vinegar. Homemade, reduced-sodium, or low-sodium soups. Unsalted popcorn and pretzels. Low-salt or salt-free chips. What foods are not recommended? The items listed may not be a complete list. Talk with your dietitian about what dietary choices are best for you. Grains Instant hot cereals. Bread stuffing, pancake, and biscuit mixes. Croutons. Seasoned rice or pasta mixes. Noodle soup cups. Boxed or frozen macaroni and cheese. Regular salted crackers. Self-rising flour. Vegetables Sauerkraut, pickled vegetables, and relishes. Olives. [place] fries. Onion rings. Regular canned vegetables (not low-sodium or reduced-sodium). Regular canned tomato sauce and paste (not low-sodium or reduced-sodium). Regular tomato and vegetable juice (not low-sodium or reduced-sodium). Frozen vegetables in sauces. Meats and other protein foods Meat or fish that is salted, canned, smoked, spiced, or pickled. Bacon, ham, sausage, hotdogs, corned beef, chipped beef, packaged lunch meats, salt pork, jerky, pickled herring, anchovies, regular canned tuna, sardines, salted nuts. Dairy Processed cheese and cheese spreads. Cheese curds. Blue cheese. Feta cheese. String cheese. Regular cottage cheese. Buttermilk. Canned milk. Fats and oils Salted butter. Regular margarine. Ghee. Bacon fat. Seasonings and other foods Onion salt, garlic salt, seasoned salt, table salt, and sea salt. Canned and packaged gravies. Worcestershire sauce. Tartar sauce. Barbecue sauce. Teriyaki sauce. Soy sauce, including reduced-sodium. Steak sauce. Fish sauce. Oyster sauce. Cocktail sauce. Horseradish that you find on the shelf. Regular ketchup and mustard. Meat flavorings and tenderizers. Bouillon cubes. Hot sauce and Tabasco sauce. Premade or packaged marinades. Premade or packaged taco seasonings. Relishes. Regular salad dressings. Salsa.  Potato and tortilla chips. Corn chips and puffs. Salted popcorn and pretzels. Canned or dried soups. Pizza. Frozen entrees and pot pies. Summary  Eating less sodium can help lower your blood pressure, reduce swelling, and protect your heart, liver, and kidneys.  Most people on this plan should limit their sodium intake to 1,500-2,000 mg (milligrams) of sodium each day.  Canned, boxed, and frozen foods are high in sodium. Restaurant foods, fast foods, and pizza are also very high in sodium. You also get sodium by adding salt to food.  Try to cook at home, eat more fresh fruits and vegetables, and eat less fast food, canned, processed, or prepared foods.     If you need a refill on your cardiac medications before your next appointment, please call your pharmacy.      Sumner Boast, PA-C  10/17/2017 12:51 PM    [facility] [address], [place], [place]  [address] Phone: [phone]; Fax: [phone]

## 2017-10-17 NOTE — Patient Instructions (Signed)
Medication Instructions:   Your physician recommends that you continue on your current medications as directed. Please refer to the Current Medication list given to you today.   Labwork:  TODAY--BMET    Testing/Procedures:  Your physician has recommended that you wear an event monitor. Event monitors are medical devices that record the heart's electrical activity. Doctors most often [place] these monitors to diagnose arrhythmias. Arrhythmias are problems with the speed or rhythm of the heartbeat. The monitor is a small, portable device. You can wear one while you do your normal daily activities. This is usually used to diagnose what is causing palpitations/syncope (passing out). PER MICHELLE LENZE PA-C YOU CAN GET THIS PUT ON AT OUR [place] LOCATION FOR COMMUTE REASONS    Follow-Up:  1 MONTH WITH DR. Radford Pax    PER MICHELLE LENZE PA-C YOU CANNOT DRIVE FOR 6 MONTHS DUE TO SYNCOPAL EPISODES    ALSO PLEASE FOLLOW RECOMMENDED LOW SODIUM DIET AS INDICATED BELOW: Low-Sodium Eating Plan Sodium, which is an element that makes up salt, helps you maintain a healthy balance of fluids in your body. Too much sodium can increase your blood pressure and cause fluid and waste to be held in your body. Your health care provider or dietitian may recommend following this plan if you have high blood pressure (hypertension), kidney disease, liver disease, or heart failure. Eating less sodium can help lower your blood pressure, reduce swelling, and protect your heart, liver, and kidneys. What are tips for following this plan? General guidelines  Most people on this plan should limit their sodium intake to 1,500-2,000 mg (milligrams) of sodium each day. Reading food labels  The Nutrition Facts label lists the amount of sodium in one serving of the food. If you eat more than one serving, you must multiply the listed amount of sodium by the number of servings.  Choose foods with less than 140 mg of sodium  per serving.    Avoid foods with 300 mg of sodium or more per serving. Shopping  Look for lower-sodium products, often labeled as "low-sodium" or "no salt added."  Always check the sodium content even if foods are labeled as "unsalted" or "no salt added".  Buy fresh foods. ? Avoid canned foods and premade or frozen meals. ? Avoid canned, cured, or processed meats  Buy breads that have less than 80 mg of sodium per slice. Cooking  Eat more home-cooked food and less restaurant, buffet, and fast food.  Avoid adding salt when cooking. Use salt-free seasonings or herbs instead of table salt or sea salt. Check with your health care provider or pharmacist before using salt substitutes.  Cook with plant-based oils, such as canola, sunflower, or olive oil. Meal planning  When eating at a restaurant, ask that your food be prepared with less salt or no salt, if possible.  Avoid foods that contain MSG (monosodium glutamate). MSG is sometimes added to [place] food, bouillon, and some canned foods. What foods are recommended? The items listed may not be a complete list. Talk with your dietitian about what dietary choices are best for you. Grains Low-sodium cereals, including oats, puffed wheat and rice, and shredded wheat. Low-sodium crackers. Unsalted rice. Unsalted pasta. Low-sodium bread. Whole-grain breads and whole-grain pasta. Vegetables Fresh or frozen vegetables. "No salt added" canned vegetables. "No salt added" tomato sauce and paste. Low-sodium or reduced-sodium tomato and vegetable juice. Fruits Fresh, frozen, or canned fruit. Fruit juice. Meats and other protein foods Fresh or frozen (no salt added) meat, poultry,  seafood, and fish. Low-sodium canned tuna and salmon. Unsalted nuts. Dried peas, beans, and lentils without added salt. Unsalted canned beans. Eggs. Unsalted nut butters. Dairy Milk. Soy milk. Cheese that is naturally low in sodium, such as ricotta cheese, fresh  mozzarella, or Swiss cheese Low-sodium or reduced-sodium cheese. Cream cheese. Yogurt. Fats and oils Unsalted butter. Unsalted margarine with no trans fat. Vegetable oils such as canola or olive oils. Seasonings and other foods Fresh and dried herbs and spices. Salt-free seasonings. Low-sodium mustard and ketchup. Sodium-free salad dressing. Sodium-free light mayonnaise. Fresh or refrigerated horseradish. Lemon juice. Vinegar. Homemade, reduced-sodium, or low-sodium soups. Unsalted popcorn and pretzels. Low-salt or salt-free chips. What foods are not recommended? The items listed may not be a complete list. Talk with your dietitian about what dietary choices are best for you. Grains Instant hot cereals. Bread stuffing, pancake, and biscuit mixes. Croutons. Seasoned rice or pasta mixes. Noodle soup cups. Boxed or frozen macaroni and cheese. Regular salted crackers. Self-rising flour. Vegetables Sauerkraut, pickled vegetables, and relishes. Olives. [place] fries. Onion rings. Regular canned vegetables (not low-sodium or reduced-sodium). Regular canned tomato sauce and paste (not low-sodium or reduced-sodium). Regular tomato and vegetable juice (not low-sodium or reduced-sodium). Frozen vegetables in sauces. Meats and other protein foods Meat or fish that is salted, canned, smoked, spiced, or pickled. Bacon, ham, sausage, hotdogs, corned beef, chipped beef, packaged lunch meats, salt pork, jerky, pickled herring, anchovies, regular canned tuna, sardines, salted nuts. Dairy Processed cheese and cheese spreads. Cheese curds. Blue cheese. Feta cheese. String cheese. Regular cottage cheese. Buttermilk. Canned milk. Fats and oils Salted butter. Regular margarine. Ghee. Bacon fat. Seasonings and other foods Onion salt, garlic salt, seasoned salt, table salt, and sea salt. Canned and packaged gravies. Worcestershire sauce. Tartar sauce. Barbecue sauce. Teriyaki sauce. Soy sauce, including reduced-sodium.  Steak sauce. Fish sauce. Oyster sauce. Cocktail sauce. Horseradish that you find on the shelf. Regular ketchup and mustard. Meat flavorings and tenderizers. Bouillon cubes. Hot sauce and Tabasco sauce. Premade or packaged marinades. Premade or packaged taco seasonings. Relishes. Regular salad dressings. Salsa. Potato and tortilla chips. Corn chips and puffs. Salted popcorn and pretzels. Canned or dried soups. Pizza. Frozen entrees and pot pies. Summary  Eating less sodium can help lower your blood pressure, reduce swelling, and protect your heart, liver, and kidneys.  Most people on this plan should limit their sodium intake to 1,500-2,000 mg (milligrams) of sodium each day.  Canned, boxed, and frozen foods are high in sodium. Restaurant foods, fast foods, and pizza are also very high in sodium. You also get sodium by adding salt to food.  Try to cook at home, eat more fresh fruits and vegetables, and eat less fast food, canned, processed, or prepared foods.     If you need a refill on your cardiac medications before your next appointment, please call your pharmacy.

## 2017-10-18 ENCOUNTER — Telehealth: Payer: Self-pay

## 2017-10-18 DIAGNOSIS — R079 Chest pain, unspecified: Secondary | ICD-10-CM

## 2017-10-18 DIAGNOSIS — R55 Syncope and collapse: Secondary | ICD-10-CM

## 2017-10-18 LAB — BASIC METABOLIC PANEL
BUN/Creatinine Ratio: 18 (ref 10–24)
BUN: 17 mg/dL (ref 8–27)
CALCIUM: 9.6 mg/dL (ref 8.6–10.2)
CO2: 26 mmol/L (ref 20–29)
CREATININE: 0.93 mg/dL (ref 0.76–1.27)
Chloride: 93 mmol/L — ABNORMAL LOW (ref 96–106)
GFR, EST AFRICAN AMERICAN: 99 mL/min/{1.73_m2} (ref >59)
GFR, EST NON AFRICAN AMERICAN: 86 mL/min/{1.73_m2} (ref >59)
Glucose: 148 mg/dL — ABNORMAL HIGH (ref 65–99)
POTASSIUM: 4 mmol/L (ref 3.5–5.2)
Sodium: 134 mmol/L (ref 134–144)

## 2017-10-18 MED ORDER — METOPROLOL TARTRATE 50 MG PO TABS: 50.0000 mg | ORAL_TABLET | Freq: Once | ORAL | 0 refills | Status: DC

## 2017-10-18 MED FILL — METOPROLOL TARTRATE 50 MG T: 50 | 1 days supply | Qty: 1 | Fill #0

## 2017-10-18 NOTE — Telephone Encounter (Signed)
I spoke with patient and informed patient of Dr. Theodosia Blender recommendation for coronary CTA. Patient in agreement with plan. Informed patient of testing instructions and advised patient that schedulers will call back to schedule once test has been approved by insurance. Patient verbalized understanding and thankful for the call.

## 2017-10-18 NOTE — Telephone Encounter (Signed)
-----   Message from Imogene Burn, PA-C sent at 10/18/2017  8:33 AM EDT ----- Mearl Latin, Can you schedule this patient for a Coronary CT as recommended by Dr. Radford Pax for syncope? Renal checked yest and was stable. Thanks, Selinda Eon ----- Message ----- From: Sueanne Margarita, MD Sent: 10/17/2017   8:39 PM To: Imogene Burn, PA-C    ----- Message ----- From: Murrell Converse Sent: 10/17/2017  12:54 PM To: Sueanne Margarita, MD  Tressia Miners, can you review this note.  You saw him in the hospital but he went back to the ER with syncope.  CT was negative along with all other testing.  Will place an event monitor.  Let me know if you recommend anything further.

## 2017-10-24 MED FILL — AMLODIPINE BESYLATE 10 MG T: 10 | 90 days supply | Qty: 90 | Fill #0

## 2017-10-26 ENCOUNTER — Ambulatory Visit (INDEPENDENT_AMBULATORY_CARE_PROVIDER_SITE_OTHER): Payer: 59

## 2017-10-26 DIAGNOSIS — E1169 Type 2 diabetes mellitus with other specified complication: Secondary | ICD-10-CM | POA: Diagnosis not present

## 2017-10-26 DIAGNOSIS — E669 Obesity, unspecified: Secondary | ICD-10-CM | POA: Diagnosis not present

## 2017-10-26 DIAGNOSIS — I10 Essential (primary) hypertension: Secondary | ICD-10-CM

## 2017-10-26 DIAGNOSIS — R55 Syncope and collapse: Secondary | ICD-10-CM

## 2017-11-08 MED FILL — JANUMET 50-1,000 MG TABLET: 50-1000 | 30 days supply | Qty: 60 | Fill #11

## 2017-11-09 ENCOUNTER — Ambulatory Visit (HOSPITAL_COMMUNITY): Payer: 59

## 2017-11-23 ENCOUNTER — Ambulatory Visit (HOSPITAL_COMMUNITY)
Admission: RE | Admit: 2017-11-23 | Discharge: 2017-11-23 | Disposition: A | Discharge status: Home / Self Care | Payer: 59 | Source: Ambulatory Visit | Attending: Cardiology | Admitting: Cardiology

## 2017-11-23 ENCOUNTER — Ambulatory Visit (HOSPITAL_COMMUNITY): Payer: 59

## 2017-11-23 DIAGNOSIS — K449 Diaphragmatic hernia without obstruction or gangrene: Secondary | ICD-10-CM | POA: Diagnosis not present

## 2017-11-23 DIAGNOSIS — R079 Chest pain, unspecified: Secondary | ICD-10-CM | POA: Diagnosis not present

## 2017-11-23 DIAGNOSIS — R55 Syncope and collapse: Secondary | ICD-10-CM | POA: Diagnosis not present

## 2017-11-23 LAB — POCT I-STAT CREATININE: Creatinine, Ser: 1 mg/dL (ref 0.61–1.24)

## 2017-11-23 MED ORDER — NITROGLYCERIN 0.4 MG SL SUBL
0.8000 mg | SUBLINGUAL_TABLET | Freq: Once | SUBLINGUAL | Status: AC
  Administered 2017-11-23: 0.8 mg via SUBLINGUAL
  Filled 2017-11-23: qty 25

## 2017-11-23 MED ORDER — NITROGLYCERIN 0.4 MG SL SUBL
SUBLINGUAL_TABLET | SUBLINGUAL | Status: AC
  Filled 2017-11-23: qty 2

## 2017-11-23 MED ORDER — IOPAMIDOL (ISOVUE-370) INJECTION 76%
100.0000 mL | Freq: Once | INTRAVENOUS | Status: AC | PRN
  Administered 2017-11-23: 80 mL via INTRAVENOUS

## 2017-11-23 MED ORDER — IOPAMIDOL (ISOVUE-370) INJECTION 76%
INTRAVENOUS | Status: AC
  Filled 2017-11-23: qty 100

## 2017-11-23 NOTE — Progress Notes (Signed)
CT scan completed. Tolerated well. D/C home walking with wife. Awake and alert. In no distress.

## 2017-11-26 ENCOUNTER — Encounter: Payer: Self-pay | Admitting: Cardiology

## 2017-11-26 DIAGNOSIS — I2510 Atherosclerotic heart disease of native coronary artery without angina pectoris: Secondary | ICD-10-CM | POA: Insufficient documentation

## 2017-11-27 ENCOUNTER — Telehealth: Payer: Self-pay | Admitting: Cardiology

## 2017-11-27 ENCOUNTER — Telehealth: Payer: Self-pay

## 2017-11-27 DIAGNOSIS — I2510 Atherosclerotic heart disease of native coronary artery without angina pectoris: Secondary | ICD-10-CM

## 2017-11-27 MED ORDER — ATORVASTATIN CALCIUM 10 MG PO TABS: 10.0000 mg | ORAL_TABLET | Freq: Every day | ORAL | 11 refills | Status: DC

## 2017-11-27 MED FILL — ATORVASTATIN 10 MG TABLET: 10 | 30 days supply | Qty: 30 | Fill #0

## 2017-11-27 NOTE — Telephone Encounter (Signed)
Notes recorded by Teressa Senter, RN on 11/27/2017 at 2:04 PM EDT Patient made aware of coronary CT results. Informed patient that due to CT results, Dr. Radford Pax recommends starting Lipitor 10 mg daily and repeat FLP and ALT in 6 weeks. Patient in agreement with treatment plan and thankful for the call.  Patient scheduled for repeat labs on 01/25/18.   Notes recorded by Sueanne Margarita, MD on 11/26/2017 at 5:40 PM EDT Low calcium score at 3.2 with mixed plaque in prox LAD with mild stenosis, minimal calcified plaque in the prox LCx and mid RCA with no stenosis. Needs aggressive risk factor modification. HbA1C 7 in 08/2017 and LDL was 82. Start Lipitor 10mg  daily and repeat FLP and ALT in 6 weeks. Followup with me in 1 year.

## 2017-11-27 NOTE — Telephone Encounter (Signed)
Follow Up:    Please call,he said he was driving when you called,he says he have some questions.

## 2017-11-27 NOTE — Telephone Encounter (Signed)
Patient informed of low calcium score of 3.2. And no significantly obstructive disease in the coronary arteries. Patient inquiring if this could cause his syncopal episodes. I informed patient that is not likely and to continue wearing heart monitor to assess for any irregular heart rhythms that may cause lightheadedness, dizziness or syncope. Patient verbalized understanding and thankful for the call.

## 2017-11-30 ENCOUNTER — Ambulatory Visit (INDEPENDENT_AMBULATORY_CARE_PROVIDER_SITE_OTHER): Payer: 59 | Admitting: Neurology

## 2017-11-30 ENCOUNTER — Encounter: Payer: Self-pay | Admitting: Neurology

## 2017-11-30 ENCOUNTER — Other Ambulatory Visit: Payer: Self-pay

## 2017-11-30 VITALS — BP 139/88 | HR 67 | Ht 72.0 in | Wt 223.5 lb

## 2017-11-30 DIAGNOSIS — R200 Anesthesia of skin: Secondary | ICD-10-CM | POA: Diagnosis not present

## 2017-11-30 DIAGNOSIS — G250 Essential tremor: Secondary | ICD-10-CM

## 2017-11-30 DIAGNOSIS — R55 Syncope and collapse: Secondary | ICD-10-CM | POA: Diagnosis not present

## 2017-11-30 DIAGNOSIS — I2510 Atherosclerotic heart disease of native coronary artery without angina pectoris: Secondary | ICD-10-CM

## 2017-11-30 HISTORY — DX: Essential tremor: G25.0

## 2017-11-30 NOTE — Progress Notes (Signed)
Reason for visit: Left arm numbness, tremor  Referring physician: [facility]  Jim Salazar is a 67 y.o. male  History of present illness:  Jim Salazar is a 67 year old right-handed white male with a history of a syncope event that occurred around 16 September 2017.  The patient indicates on the day of the syncopal event he noted onset of numbness involving the left hand that gradually spread up the arm to the shoulder and then into the left face and tongue.  This event resolved in several minutes, unassociated with any weakness or speech changes or vision changes.  The patient later had an event of syncope associated with lightheaded sensations prior to the blackout.  The patient tried to get up and go to the bathroom but did not black out again, had nausea and vomiting associated with this.  The patient was diaphoretic and looked blanched in the face.  The patient had another event of syncope on 07 October 2017.  The patient did not have left arm numbness with this event, he again had an event of feeling lightheaded and then blacked out and had nausea and vomiting.  He has had a cardiac work-up that has been unrevealing.  No evidence of cardiac ischemia was noted.  2D echocardiogram was unremarkable.  The patient indicated that prior to February he has had headaches off and on for about a month.  He had been running extremely high blood pressures apparently with a systolic pressure greater than 200.  MRI of the brain was done, MRA of the head and neck was done, no evidence of a stroke or evidence of blood vessel stenosis or occlusion was seen.  The patient does have a history of diabetes.  The patient has had a prolonged cardiac monitor study, the results are pending.  The patient also comes in for evaluation of a tremor problem that has been present for about a year.  The tremor affects the left greater than right upper extremity and the head and neck.  He has no vocal tremor.  He has a strong family  history of tremor in the mother, maternal aunt, maternal grandfather, maternal great grandfather.  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  The patient comes into this office for an evaluation.  Past Medical History:  Diagnosis Date  . CAD (coronary artery disease), native coronary artery    mild nonobstructive plaque in LCx and RCA and mildly obstructive minimally calcified plaque in mid LAD on coronary CTA 11/2017  . Diabetes mellitus without complication ([place])   . Hypertension   . Thyroid disease   . Tremor, essential 11/30/2017    Past Surgical History:  Procedure Laterality Date  . APPENDECTOMY    . TONSILLECTOMY      Family History  Problem Relation Age of Onset  . Hypertension Other   . Diabetes Mellitus II Mother   . Diabetes Mellitus II Father     Social history:  reports that he has never smoked. He has never used smokeless tobacco. He reports that he does not drink alcohol or use drugs.  Medications:  Prior to Admission medications   Medication Sig Start Date End Date Taking? Authorizing Provider  amLODipine (NORVASC) 10 MG tablet Take 10 mg by mouth daily. AT BEDTIME   Yes [provider]  aspirin EC 81 MG tablet Take 81  mg by mouth every evening.   Yes [provider]  atorvastatin (LIPITOR) 10 MG tablet Take 1 tablet (10 mg total) by mouth daily. 11/27/17  Yes Turner, Eber Hong, MD  cetirizine (ZYRTEC) 10 MG tablet Take 10 mg by mouth daily as needed (for seasonal allergies).   Yes [provider]  dexlansoprazole (DEXILANT) 60 MG capsule Take 60 mg by mouth as needed. FOR REFLUX   Yes [provider]  ibuprofen (ADVIL,MOTRIN) 200 MG tablet Take 400 mg by mouth as needed (for headaches).    Yes [provider]  JANUMET 50-1000 MG tablet Take 1 tablet by mouth 2 (two) times daily. 08/28/17  Yes  [provider]  lisinopril-hydrochlorothiazide (PRINZIDE,ZESTORETIC) 20-12.5 MG tablet Take 2 tablets by mouth daily. 09/18/17  Yes Geradine Girt, DO  lisinopril-hydrochlorothiazide (PRINZIDE,ZESTORETIC) 20-12.5 MG tablet Take 1 tablet by mouth daily. IN THE MORNING   Yes [provider]  PARoxetine (PAXIL) 20 MG tablet Take 20 mg by mouth daily. 06/19/17  Yes [provider]  ranitidine (ZANTAC) 150 MG capsule Take 150 mg by mouth as needed for heartburn.   Yes [provider]  metoprolol tartrate (LOPRESSOR) 50 MG tablet Take 1 tablet (50 mg total) by mouth once for 1 dose. 10/18/17 10/18/17  Sueanne Margarita, MD     No Known Allergies  ROS:  Out of a complete 14 system review of symptoms, the patient complains only of the following symptoms, and all other reviewed systems are negative.  Chest pain Snoring Tremor Anxiety  Blood pressure 139/88, pulse 67, height 6' (1.829 m), weight 223 lb 8 oz (101.4 kg).  Physical Exam  General: The patient is alert and cooperative at the time of the examination.  Eyes: Pupils are equal, round, and reactive to light. Discs are flat bilaterally.  Neck: The neck is supple, no carotid bruits are noted.  Respiratory: The respiratory examination is clear.  Cardiovascular: The cardiovascular examination reveals a regular rate and rhythm, no obvious murmurs or rubs are noted.  Skin: Extremities are without significant edema.  Neurologic Exam  Mental status: The patient is alert and oriented x 3 at the time of the examination. The patient has apparent normal recent and remote memory, with an apparently normal attention span and concentration ability.  Cranial nerves: Facial symmetry is present. There is good sensation of the face to pinprick and soft touch bilaterally. The strength of the facial muscles and the muscles to head turning and shoulder shrug are normal bilaterally. Speech is well enunciated, no aphasia  or dysarthria is noted. Extraocular movements are full. Visual fields are full. The tongue is midline, and the patient has symmetric elevation of the soft palate. No obvious hearing deficits are noted.  A slight side-to-side head tremor is seen.  Motor: The motor testing reveals 5 over 5 strength of all 4 extremities. Good symmetric motor tone is noted throughout.  Sensory: Sensory testing is intact to pinprick, soft touch, vibration sensation, and position sense on all 4 extremities. No evidence of extinction is noted.  Coordination: Cerebellar testing reveals good finger-nose-finger and heel-to-shin bilaterally.  The patient has intention tremors with finger-nose-finger bilaterally, slightly worse on the left than the right.  With drawing a spiral, the tremor is translated into handwriting.  Gait and station: Gait is normal. Tandem gait is normal. Romberg is negative. No drift is seen.  Reflexes: Deep tendon reflexes are symmetric and normal bilaterally. Toes are downgoing bilaterally.   Assessment/Plan:  1.  Transient left upper extremity and face and tongue numbness  2.  Essential tremor  3.  Syncope  The patient likely had transient sensory symptoms associated with extremely high blood pressures in vasospasm as an etiology of the deficit.  His blood pressures are under good control, this is not likely to recur.  The patient is on aspirin therapy.  The patient has an essential tremor, he will contact our office if he decides that he wishes to have medical therapy for this.  The patient will follow-up in this office if needed.  Jill Alexanders MD 11/30/2017 11:11 AM  [facility] [address] [place], [place] [address]  Phone [phone] Fax [phone]

## 2017-12-10 ENCOUNTER — Encounter: Payer: Self-pay | Admitting: Cardiology

## 2017-12-10 ENCOUNTER — Ambulatory Visit (INDEPENDENT_AMBULATORY_CARE_PROVIDER_SITE_OTHER): Payer: 59 | Admitting: Cardiology

## 2017-12-10 VITALS — BP 118/68 | HR 73 | Ht 72.0 in | Wt 225.4 lb

## 2017-12-10 DIAGNOSIS — I10 Essential (primary) hypertension: Secondary | ICD-10-CM | POA: Diagnosis not present

## 2017-12-10 DIAGNOSIS — R55 Syncope and collapse: Secondary | ICD-10-CM

## 2017-12-10 DIAGNOSIS — I2510 Atherosclerotic heart disease of native coronary artery without angina pectoris: Secondary | ICD-10-CM

## 2017-12-10 MED ORDER — LISINOPRIL 40 MG PO TABS: 40.0000 mg | ORAL_TABLET | Freq: Every day | ORAL | 11 refills | Status: DC

## 2017-12-10 MED FILL — LISINOPRIL 40 MG TABLET: 40 | 90 days supply | Qty: 90 | Fill #0

## 2017-12-10 MED FILL — JANUMET 50-1,000 MG TABLET: 50-1000 | 30 days supply | Qty: 60 | Fill #0

## 2017-12-10 NOTE — Patient Instructions (Addendum)
Medication Instructions:  Your physician has recommended you make the following change in your medication:  STOP: prinizide   START: Lisinopril 40 mg (1 tablet) once a day  If you need a refill on your cardiac medications, please contact your pharmacy first.  Labwork: None ordered   Testing/Procedures: None ordered   Follow-Up: Your physician wants you to follow-up in: 1 year with Dr. Radford Pax. You will receive a reminder letter in the mail two months in advance. If you don't receive a letter, please call our office to schedule the follow-up appointment.  Any Other Special Instructions Will Be Listed Below (If Applicable). Your physician recommends that you wear compression socks during the day and remove at night.   Your physician would like you to check your blood pressure daily for the next week and call office with blood pressure readings.    How to Use Compression Stockings Compression stockings are elastic socks that squeeze the legs. They help to increase blood flow to the legs, decrease swelling in the legs, and reduce the chance of developing blood clots in the lower legs. Compression stockings are often used by people who:  Are recovering from surgery.  Have poor circulation in their legs.  Are prone to getting blood clots in their legs.  Have varicose veins.  Sit or stay in bed for long periods of time.  How to use compression stockings Before you put on your compression stockings:  Make sure that they are the correct size. If you do not know your size, ask your health care provider.  Make sure that they are clean, dry, and in good condition.  Check them for rips and tears. Do not put them on if they are ripped or torn.  Put your stockings on first thing in the morning, before you get out of bed. Keep them on for as long as your health care provider advises. When you are wearing your stockings:  Keep them as smooth as possible. Do not allow them to bunch up. It  is especially important to prevent the stockings from bunching up around your toes or behind your knees.  Do not roll the stockings downward and leave them rolled down. This can decrease blood flow to your leg.  Change them right away if they become wet or dirty.  When you take off your stockings, inspect your legs and feet. Anything that does not seem normal may require medical attention. Look for:  Open sores.  Red spots.  Swelling.  Information and tips  Do not stop wearing your compression stockings without talking to your health care provider first.  Wash your stockings every day with mild detergent in cold or warm water. Do not use bleach. Air-dry your stockings or dry them in a clothes dryer on low heat.  Replace your stockings every 3-6 months.  If skin moisturizing is part of your treatment plan, apply lotion or cream at night so that your skin will be dry when you put on the stockings in the morning. It is harder to put the stockings on when you have lotion on your legs or feet. Contact a health care provider if: Remove your stockings and seek medical care if:  You have a feeling of pins and needles in your feet or legs.  You have any new changes in your skin.  You have skin lesions that are getting worse.  You have swelling or pain that is getting worse.  Get help right away if:  You have  numbness or tingling in your lower legs that does not get better right after you take the stockings off.  Your toes or feet become cold and blue.  You develop open sores or red spots on your legs that do not go away.  You see or feel a warm spot on your leg.  You have new swelling or soreness in your leg.  You are short of breath or you have chest pain for no reason.  You have a rapid or irregular heartbeat.  You feel light-headed or dizzy. This information is not intended to replace advice given to you by your health care provider. Make sure you discuss any questions  you have with your health care provider. Document Released: 05/07/2009 Document Revised: 12/08/2015 Document Reviewed: 06/17/2014 Elsevier Interactive Patient Education  2018 [employer].   How to Take Your Blood Pressure You can take your blood pressure at home with a machine. You may need to check your blood pressure at home:  To check if you have high blood pressure (hypertension).  To check your blood pressure over time.  To make sure your blood pressure medicine is working.  Supplies needed: You will need a blood pressure machine, or monitor. You can buy one at a drugstore or online. When choosing one:  Choose one with an arm cuff.  Choose one that wraps around your upper arm. Only one finger should fit between your arm and the cuff.  Do not choose one that measures your blood pressure from your wrist or finger.  Your doctor can suggest a monitor. How to prepare Avoid these things for 30 minutes before checking your blood pressure:  Drinking caffeine.  Drinking alcohol.  Eating.  Smoking.  Exercising.  Five minutes before checking your blood pressure:  Pee.  Sit in a dining chair. Avoid sitting in a soft couch or armchair.  Be quiet. Do not talk.  How to take your blood pressure Follow the instructions that came with your machine. If you have a digital blood pressure monitor, these may be the instructions: 1. Sit up straight. 2. Place your feet on the floor. Do not cross your ankles or legs. 3. Rest your left arm at the level of your heart. You may rest it on a table, desk, or chair. 4. Pull up your shirt sleeve. 5. Wrap the blood pressure cuff around the upper part of your left arm. The cuff should be 1 inch (2.5 cm) above your elbow. It is best to wrap the cuff around bare skin. 6. Fit the cuff snugly around your arm. You should be able to place only one finger between the cuff and your arm. 7. Put the cord inside the groove of your elbow. 8. Press the  power button. 9. Sit quietly while the cuff fills with air and loses air. 10. Write down the numbers on the screen. 11. Wait 2-3 minutes and then repeat steps 1-10.  What do the numbers mean? Two numbers make up your blood pressure. The first number is called systolic pressure. The second is called diastolic pressure. An example of a blood pressure reading is "120 over 80" (or 120/80). If you are an adult and do not have a medical condition, use this guide to find out if your blood pressure is normal: Normal  First number: below 120.  Second number: below 80. Elevated  First number: 120-129.  Second number: below 80. Hypertension stage 1  First number: 130-139.  Second number: 80-89. Hypertension stage 2  First number: 140 or above.  Second number: 5 or above. Your blood pressure is above normal even if only the top or bottom number is above normal. Follow these instructions at home:  Check your blood pressure as often as your doctor tells you to.  Take your monitor to your next doctor's appointment. Your doctor will: ? Make sure you are using it correctly. ? Make sure it is working right.  Make sure you understand what your blood pressure numbers should be.  Tell your doctor if your medicines are causing side effects. Contact a doctor if:  Your blood pressure keeps being high. Get help right away if:  Your first blood pressure number is higher than 180.  Your second blood pressure number is higher than 120. This information is not intended to replace advice given to you by your health care provider. Make sure you discuss any questions you have with your health care provider. Document Released: 06/22/2008 Document Revised: 06/07/2016 Document Reviewed: 12/17/2015 Elsevier Interactive Patient Education  2018 [employer].   Thank you for choosing [facility] [place]  If you need a refill on your cardiac medications before your  next appointment, please call your pharmacy.

## 2017-12-10 NOTE — Progress Notes (Signed)
Cardiology Office Note:    Date:  12/10/2017   ID:  Jim Salazar, DOB 04-16-1951, MRN [id number]  PCP:  Sinda Du, MD  Cardiologist:  Fransico Him, MD    Referring MD: Sinda Du, MD   Chief Complaint  Patient presents with  . Coronary Artery Disease  . Hypertension    History of Present Illness:    Jim Salazar is a 67 y.o. male with a hx of hypertension, DM type II, hypothyroidism who presented to the emergency room earlier this year with 15-minute episode of left upper extremity numbness up to his neck jaw and tongue.  He also endorsed anterior chest pain.  Troponins were negative x3 and EKG with nonspecific changes.  MRI showed no acute CVA but concern for possible TIA.  Nuclear stress test showed inferior infarct with no ischemia but 2D echo showed normal LV EF with no inferior wall motion abnormality.  Inferior defect on nuclear stress test felt likely related to diaphragmatic attenuation artifact.  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  He was evaluated in the ER which was normal.  He was advised not to drive for 6 months.  Event monitor showed no arrhythmias.  He subsequently underwent coronary CTA with a low calcium score 3.2 with mixed plaque in the proximal LAD with mild stenosis, minimal calcified plaque in the proximal left circumflex and mid RCA without stenosis.  He is here today for followup and is doing well.  He denies any chest pain or pressure, SOB, DOE, PND, orthopnea, LE edema, dizziness, palpitations or syncope. He is compliant with his meds and is tolerating meds with no SE.     Past Medical History:  Diagnosis Date  . CAD (coronary artery disease), native coronary artery    mild nonobstructive plaque in LCx and RCA and  mildly obstructive minimally calcified plaque in mid LAD on coronary CTA 11/2017  . Diabetes mellitus without complication ([place])   . Hypertension   . Thyroid disease   . Tremor, essential 11/30/2017    Past Surgical History:  Procedure Laterality Date  . APPENDECTOMY    . TONSILLECTOMY      Current Medications: Current Meds  Medication Sig  . amLODipine (NORVASC) 10 MG tablet Take 10 mg by mouth daily. AT BEDTIME  . aspirin EC 81 MG tablet Take 81 mg by mouth every evening.  Marland Kitchen atorvastatin (LIPITOR) 10 MG tablet Take 1 tablet (10 mg total) by mouth daily.  . cetirizine (ZYRTEC) 10 MG tablet Take 10 mg by mouth daily as needed (for seasonal allergies).  Marland Kitchen dexlansoprazole (DEXILANT) 60 MG capsule Take 60 mg by mouth as needed. FOR REFLUX  . ibuprofen (ADVIL,MOTRIN) 200 MG tablet Take 400 mg by mouth as needed (for headaches).   Marland Kitchen JANUMET 50-1000 MG tablet Take 1 tablet by mouth 2 (two) times daily.  Marland Kitchen lisinopril-hydrochlorothiazide (PRINZIDE,ZESTORETIC) 20-12.5 MG tablet Take 2 tablets by mouth daily.  . metoprolol tartrate (LOPRESSOR) 50 MG tablet Take 1 tablet (50 mg total) by mouth once for 1 dose.  Marland Kitchen PARoxetine (PAXIL) 20 MG tablet Take 20 mg by mouth daily.  . ranitidine (ZANTAC) 150 MG capsule Take 150 mg by mouth as needed for heartburn.     Allergies:   Patient  has no known allergies.   Social History   Socioeconomic History  . Marital status: Married    Spouse name: Not on file  . Number of children: 2  . Years of education: College  . Highest education level: Not on file  Occupational History  . Not on file  Social Needs  . Financial resource strain: Not on file  . Food insecurity:    Worry: Not on file    Inability: Not on file  . Transportation needs:    Medical: Not on file    Non-medical: Not on file  Tobacco Use  . Smoking status: Never Smoker  . Smokeless tobacco: Never Used  Substance and Sexual Activity  . Alcohol use: No    Frequency: Never  .  Drug use: No  . Sexual activity: Not on file  Lifestyle  . Physical activity:    Days per week: Not on file    Minutes per session: Not on file  . Stress: Not on file  Relationships  . Social connections:    Talks on phone: Not on file    Gets together: Not on file    Attends religious service: Not on file    Active member of club or organization: Not on file    Attends meetings of clubs or organizations: Not on file    Relationship status: Not on file  Other Topics Concern  . Not on file  Social History Narrative   Lives with wife   Caffeine use: 1-2 cups decaf coffee daily   Right handed      Family History: The patient's family history includes Diabetes Mellitus II in his father and mother; Hypertension in his other.  ROS:   Please see the history of present illness.    ROS  All other systems reviewed and negative.   EKGs/Labs/Other Studies Reviewed:    The following studies were reviewed today: none  EKG:  EKG is not ordered today.   Recent Labs: 09/17/2017: ALT 16; TSH 2.643 10/07/2017: Hemoglobin 14.5; Platelets 243 10/17/2017: BUN 17; Potassium 4.0; Sodium 134 11/23/2017: Creatinine, Ser 1.00   Recent Lipid Panel    Component Value Date/Time   CHOL 146 09/17/2017 0843   TRIG 126 09/17/2017 0843   HDL 39 (L) 09/17/2017 0843   CHOLHDL 3.7 09/17/2017 0843   VLDL 25 09/17/2017 0843   LDLCALC 82 09/17/2017 0843    Physical Exam:    VS:  BP 118/68   Pulse 73   Ht 6' (1.829 m)   Wt 225 lb 6.4 oz (102.2 kg)   SpO2 98%   BMI 30.57 kg/m     Wt Readings from Last 3 Encounters:  12/10/17 225 lb 6.4 oz (102.2 kg)  11/30/17 223 lb 8 oz (101.4 kg)  10/17/17 227 lb 4 oz (103.1 kg)     GEN:  Well nourished, well developed in no acute distress HEENT: Normal NECK: No JVD; No carotid bruits LYMPHATICS: No lymphadenopathy CARDIAC: RRR, no murmurs, rubs, gallops RESPIRATORY:  Clear to auscultation without rales, wheezing or rhonchi  ABDOMEN: Soft, non-tender,  non-distended MUSCULOSKELETAL:  No edema; No deformity  SKIN: Warm and dry NEUROLOGIC:  Alert and oriented x 3 PSYCHIATRIC:  Normal affect   ASSESSMENT:    1. Coronary artery disease involving native coronary artery of native heart without angina pectoris   2. Essential hypertension   3. Syncope, unspecified syncope type    PLAN:    In order of problems listed above:  1.  ASCAD - coronary CTA with a low calcium score 3.2 with mixed plaque in the proximal LAD with mild stenosis, minimal calcified plaque in the proximal left circumflex and mid RCA without stenosis.  He denies any anginal symptoms.  He will continue on aspirin 81 mg daily, beta-blocker and statin.    2.  HTN -BP is well controlled on exam today.  He will continue on amlodipine 10 mg daily and Lopressor 50 mg daily.  I am going to stop his Prinzide and start him on lisinopril 40 mg daily.  We will take out the diuretic part of his Prinzide as this will make him more prone to orthostasis and further syncope.  I have asked him to check his blood pressure daily for a week and call me with the results.  3.  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  He has had no further syncopal spells.  Event monitor was normal and cardiac catheterization showed no evidence of significant CAD.  2D echocardiogram showed normal LV function.  As stated in #2 above, we will stop the diuretic part of his Prinzide and put him back on lisinopril 40 mg daily as a diuretic could be potentiating orthostatic hypotension and syncope.  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  He will let me know if he has any further syncopal episodes.   Medication Adjustments/Labs and Tests Ordered: Current medicines are reviewed at length with the patient today.  Concerns regarding medicines are outlined above.  No orders of the defined types were placed in this  encounter.  No orders of the defined types were placed in this encounter.   Signed, Fransico Him, MD  12/10/2017 10:01 AM    [place]

## 2017-12-12 ENCOUNTER — Ambulatory Visit: Payer: Medicare Other | Admitting: Cardiology

## 2018-01-01 MED FILL — ATORVASTATIN 10 MG TABLET: 10 | 30 days supply | Qty: 30 | Fill #1

## 2018-01-01 MED FILL — PARoxetine HCL 20 MG TABS: 20 | 90 days supply | Qty: 90 | Fill #2

## 2018-01-01 MED FILL — LEVOTHYROXINE 112 MCG TAB: 112 | 90 days supply | Qty: 90 | Fill #2

## 2018-01-14 MED FILL — JANUMET 50-1,000 MG TABLET: 50-1000 | 30 days supply | Qty: 60 | Fill #1

## 2018-01-25 ENCOUNTER — Other Ambulatory Visit: Payer: Medicare Other

## 2018-01-28 MED FILL — AMLODIPINE BESYLATE 10 MG T: 10 | 90 days supply | Qty: 90 | Fill #1

## 2018-01-28 MED FILL — ATORVASTATIN 10 MG TABLET: 10 | 30 days supply | Qty: 30 | Fill #2

## 2018-02-15 MED FILL — JANUMET 50-1,000 MG TABLET: 50-1000 | 30 days supply | Qty: 60 | Fill #2

## 2018-03-01 MED FILL — ATORVASTATIN 10 MG TABLET: 10 | 30 days supply | Qty: 30 | Fill #3

## 2018-03-11 MED FILL — LISINOPRIL 40 MG TABLET: 40 | 90 days supply | Qty: 90 | Fill #1

## 2018-03-12 MED FILL — JANUMET 50-1,000 MG TABLET: 50-1000 | 30 days supply | Qty: 60 | Fill #3

## 2018-04-03 MED FILL — LEVOTHYROXINE 112 MCG TAB: 112 | 90 days supply | Qty: 90 | Fill #3

## 2018-04-03 MED FILL — ATORVASTATIN 10 MG TABLET: 10 | 30 days supply | Qty: 30 | Fill #4

## 2018-04-03 MED FILL — PARoxetine HCL 20 MG TABS: 20 | 90 days supply | Qty: 90 | Fill #3

## 2018-04-18 MED FILL — JANUMET 50-1,000 MG TABLET: 50-1000 | 30 days supply | Qty: 60 | Fill #4

## 2018-05-08 MED FILL — ATORVASTATIN 10 MG TABLET: 10 | 90 days supply | Qty: 90 | Fill #5

## 2018-05-17 MED FILL — AMLODIPINE BESYLATE 10 MG T: 10 | 90 days supply | Qty: 90 | Fill #2

## 2018-05-20 MED FILL — JANUMET 50-1,000 MG TABLET: 50-1000 | 30 days supply | Qty: 60 | Fill #5

## 2018-05-22 ENCOUNTER — Telehealth (INDEPENDENT_AMBULATORY_CARE_PROVIDER_SITE_OTHER): Payer: Self-pay | Admitting: Internal Medicine

## 2018-05-22 DIAGNOSIS — H6500 Acute serous otitis media, unspecified ear: Secondary | ICD-10-CM

## 2018-05-22 MED ORDER — CIPROFLOXACIN-HYDROCORTISONE 0.2-1 % OT SUSP: 3.0000 [drp] | Freq: Two times a day (BID) | OTIC | 0 refills | Status: DC

## 2018-05-22 NOTE — Telephone Encounter (Signed)
Rx for ear drops sent to his pharmacy

## 2018-06-12 MED FILL — LISINOPRIL 40 MG TABLET: 40 | 90 days supply | Qty: 90 | Fill #2

## 2018-06-21 MED FILL — JANUMET 50-1,000 MG TABLET: 50-1000 | 30 days supply | Qty: 60 | Fill #6

## 2018-07-03 MED FILL — LEVOTHYROXINE 112 MCG TAB: 112 | 90 days supply | Qty: 90 | Fill #0

## 2018-07-03 MED FILL — PARoxetine HCL 20 MG TABS: 20 | 90 days supply | Qty: 90 | Fill #0

## 2018-07-19 MED FILL — JANUMET 50-1,000 MG TABLET: 50-1000 | 30 days supply | Qty: 60 | Fill #7

## 2018-08-19 MED FILL — JANUMET 50-1,000 MG TABLET: 50-1000 | 30 days supply | Qty: 60 | Fill #8

## 2018-08-27 MED FILL — ATORVASTATIN 10 MG TABLET: 10 | 90 days supply | Qty: 90 | Fill #6

## 2018-08-27 MED FILL — AMLODIPINE BESYLATE 10 MG T: 10 | 90 days supply | Qty: 90 | Fill #3

## 2018-09-17 MED FILL — LISINOPRIL 40 MG TABLET: 40 | 90 days supply | Qty: 90 | Fill #3 | Status: TO

## 2018-09-19 MED FILL — JANUMET 50-1,000 MG TABLET: 50-1000 | 30 days supply | Qty: 60 | Fill #9 | Status: TO

## 2018-10-02 MED FILL — LEVOTHYROXINE 112 MCG TAB: 112 | 90 days supply | Qty: 90 | Fill #1

## 2018-10-02 MED FILL — PARoxetine HCL 20 MG TABS: 20 | 90 days supply | Qty: 90 | Fill #1

## 2018-10-14 MED FILL — JANUMET 50-1,000 MG TABLET: 50-1000 | 30 days supply | Qty: 60 | Fill #0

## 2018-11-01 DIAGNOSIS — Z0000 Encounter for general adult medical examination without abnormal findings: Secondary | ICD-10-CM | POA: Diagnosis not present

## 2018-11-04 MED FILL — ACCU-CHEK GUIDE TEST STRIP: 90 days supply | Qty: 200 | Fill #0

## 2018-11-04 MED FILL — ACCU-CHEK FASTCLIX LANCETS: 90 days supply | Qty: 204 | Fill #0

## 2018-11-12 MED FILL — JANUMET 50-1,000 MG TABLET: 50-1000 | 30 days supply | Qty: 60 | Fill #1

## 2018-12-03 ENCOUNTER — Other Ambulatory Visit: Payer: Self-pay | Admitting: Cardiology

## 2018-12-03 MED FILL — AMLODIPINE BESYLATE 10 MG T: 10 | 90 days supply | Qty: 90 | Fill #0

## 2018-12-04 MED FILL — ATORVASTATIN 10 MG TABLET: 10 | 30 days supply | Qty: 30 | Fill #0

## 2018-12-17 ENCOUNTER — Other Ambulatory Visit: Payer: Self-pay | Admitting: Cardiology

## 2018-12-17 MED FILL — LISINOPRIL 40 MG TABLET: 40 | 30 days supply | Qty: 30 | Fill #0

## 2018-12-17 MED FILL — JANUMET 50-1,000 MG TABLET: 50-1000 | 90 days supply | Qty: 180 | Fill #0

## 2019-01-01 MED FILL — LEVOTHYROXINE 112 MCG TAB: 112 | 90 days supply | Qty: 90 | Fill #0

## 2019-01-01 MED FILL — PARoxetine HCL 20 MG TABS: 20 | 90 days supply | Qty: 90 | Fill #0

## 2019-01-15 MED FILL — LISINOPRIL 40 MG TABLET: 40 | 30 days supply | Qty: 30 | Fill #1

## 2019-01-15 MED FILL — ATORVASTATIN 10 MG TABLET: 10 | 30 days supply | Qty: 30 | Fill #1

## 2019-02-21 MED FILL — LISINOPRIL 40 MG TABLET: 40 | 30 days supply | Qty: 30 | Fill #2

## 2019-02-21 MED FILL — ATORVASTATIN 10 MG TABLET: 10 | 30 days supply | Qty: 30 | Fill #2

## 2019-03-15 MED FILL — AMLODIPINE BESYLATE 10 MG T: 10 | 90 days supply | Qty: 90 | Fill #1

## 2019-03-25 ENCOUNTER — Other Ambulatory Visit: Payer: Self-pay | Admitting: Cardiology

## 2019-03-26 ENCOUNTER — Other Ambulatory Visit: Payer: Self-pay | Admitting: Cardiology

## 2019-03-26 MED ORDER — LISINOPRIL 40 MG PO TABS: 40.0000 mg | ORAL_TABLET | Freq: Every day | ORAL | 0 refills | Status: DC

## 2019-03-26 MED ORDER — AMLODIPINE BESYLATE 10 MG PO TABS: 10.0000 mg | ORAL_TABLET | Freq: Every day | ORAL | 0 refills | Status: DC

## 2019-03-26 MED ORDER — ATORVASTATIN CALCIUM 10 MG PO TABS: 10.0000 mg | ORAL_TABLET | Freq: Every day | ORAL | 2 refills | Status: DC

## 2019-03-26 MED FILL — ATORVASTATIN 10 MG TABLET: 10 | 15 days supply | Qty: 15 | Fill #0

## 2019-03-26 MED FILL — LISINOPRIL 40 MG TABLET: 40 | 15 days supply | Qty: 15 | Fill #0

## 2019-04-03 MED FILL — JANUMET 50-1,000 MG TABLET: 50-1000 | 60 days supply | Qty: 120 | Fill #1

## 2019-04-15 ENCOUNTER — Other Ambulatory Visit: Payer: Self-pay | Admitting: Cardiology

## 2019-04-15 MED FILL — ATORVASTATIN 10 MG TABLET: 10 | 15 days supply | Qty: 15 | Fill #1

## 2019-04-16 MED FILL — PARoxetine HCL 20 MG TABS: 20 | 90 days supply | Qty: 90 | Fill #1

## 2019-04-16 MED FILL — LISINOPRIL 40 MG TABLET: 40 | 30 days supply | Qty: 30 | Fill #0

## 2019-04-16 MED FILL — LEVOTHYROXINE 112 MCG TAB: 112 | 90 days supply | Qty: 90 | Fill #1

## 2019-04-29 ENCOUNTER — Telehealth: Payer: Self-pay

## 2019-04-29 NOTE — Progress Notes (Signed)
Virtual Visit via Telephone Note   This visit type was conducted due to national recommendations for restrictions regarding the COVID-19 Pandemic (e.g. social distancing) in an effort to limit this patient's exposure and mitigate transmission in our community.  Due to his co-morbid illnesses, this patient is at least at moderate risk for complications without adequate follow up.  This format is felt to be most appropriate for this patient at this time.  The patient did not have access to video technology/had technical difficulties with video requiring transitioning to audio format only (video).  All issues noted in this document were discussed and addressed.  No physical exam could be performed with this format.  Please refer to the patient's chart for his  consent to telehealth for [facility].    Date:  04/30/2019   ID:  JAHAVEN BILSON, DOB 22-Dec-1950, MRN [id number]  Patient Location: Home Provider Location: Home  PCP:  Sinda Du, MD  Cardiologist:  Fransico Him, MD   Electrophysiologist:  None   Evaluation Performed:  Follow-Up Visit  Chief Complaint:  F/u   History of Present Illness:    ALAKAI HERSI is a 68 y.o. male with with history of hypertension, DM type II, hypothyroidism who presented to the emergency room 08/2017 with 15-minute episode of left upper extremity numbness up to his neck jaw and tongue.  He also endorsed anterior chest pain.  Troponins were negative x3 and EKG with nonspecific changes.  MRI showed no acute CVA but concern for possible TIA.  He was also hypertensive.  Nuclear stress test read out was inferior infarct with no ischemia but 2D echo showed normal LV EF with no inferior wall motion abnormality.  Inferior defect on nuclear stress test likely related to diaphragmatic attenuation artifact and is worse on rest images.  Dr. Radford Pax recommended aggressive BP control as his symptoms were likely related to poorly controlled blood pressure.  If he has  recurrent chest pain after blood pressure is controlled he may need a coronary CTA or cath.  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  Event monitor was norma,l coronary CTA showed low calcium score of 3.2 with mixed plaque in the proximal LAD with mild stenosis, minimal calcified plaque in the proximal left circumflex and mid RCA without stenosis., 2D echo normal LV function.  Diuretic was stopped placed on lisinopril.   Denies chest pain, shortness of breath,dizziness or presyncope. Overall doing well.Works as a Company secretary. BP up today but hasn't taken his meds. BP usually runs 140/78. He checks it about once a week. Doing [place] [employer] 20-30 min 4 days a week. Not walking since covid19. Watches salt closely. Likes sweet. Waiting to hear who his new PCP will be when Dr. Katherine Mantle.First grandson born on Sunday.  The patient does not have symptoms concerning for COVID-19 infection (fever, chills, cough, or new shortness of breath).    Past Medical History:  Diagnosis Date  . CAD (coronary artery disease), native coronary artery    mild nonobstructive plaque in LCx and RCA and mildly obstructive minimally calcified plaque in mid LAD on coronary CTA 11/2017  . Diabetes mellitus without complication ([place])   . Hypertension   . Thyroid disease   . Tremor, essential 11/30/2017   Past Surgical History:  Procedure Laterality Date  . APPENDECTOMY    . TONSILLECTOMY       Current Meds  Medication Sig  .  amLODipine (NORVASC) 10 MG tablet Take 1 tablet (10 mg total) by mouth daily. AT BEDTIME. Please make overdue appt with Dr.Turner before anymore refills.3rd and Final Attempt  . aspirin EC 81 MG tablet Take 81 mg by mouth every evening.  Marland Kitchen atorvastatin (LIPITOR) 10 MG tablet Take 1 tablet (10 mg total) by mouth daily. Please make overdue appt with Dr. Radford Pax before anymore refills. 3rd and Final Attempt  . cetirizine (ZYRTEC)  10 MG tablet Take 10 mg by mouth daily as needed (for seasonal allergies).  Marland Kitchen dexlansoprazole (DEXILANT) 60 MG capsule Take 60 mg by mouth as needed. FOR REFLUX  . ibuprofen (ADVIL,MOTRIN) 200 MG tablet Take 400 mg by mouth as needed (for headaches).   Marland Kitchen JANUMET 50-1000 MG tablet Take 1 tablet by mouth 2 (two) times daily.  Marland Kitchen lisinopril (ZESTRIL) 40 MG tablet Take 1 tablet (40 mg total) by mouth daily. Please keep upcoming appt in October with Dr. Radford Pax before anymore refills. Thank you  . PARoxetine (PAXIL) 20 MG tablet Take 20 mg by mouth daily.     Allergies:   Patient has no known allergies.   Social History   Tobacco Use  . Smoking status: Never Smoker  . Smokeless tobacco: Never Used  Substance Use Topics  . Alcohol use: No    Frequency: Never  . Drug use: No     Family Hx: The patient's family history includes Diabetes Mellitus II in his father and mother; Hypertension in an other family member.  ROS:   Please see the history of present illness.      All other systems reviewed and are negative.   Prior CV studies:   The following studies were reviewed today:  2D Echo 09/17/17   Study Conclusions   - Left ventricle: The cavity size was normal. Wall thickness was   increased in a pattern of mild LVH. Systolic function was normal.   The estimated ejection fraction was in the range of 60% to 65%. - Mitral valve: There was mild regurgitation.     Myoview 09/18/17  IMPRESSION: 1. 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 No definite peri-infarct ischemia.   2. Inferior wall showing no wall thickening or wall motion. Mild global hypokinesis otherwise.   3. Left ventricular ejection fraction 40%   4. Non invasive risk stratification*: High risk     Reviewed the myoview with Dr. Radford Pax who felt his symptom likely is related to uncontrolled BP. Myoview result was felt to be lower risk and maybe diaphragmatic attenuation. No plan for cath or coronary CT  unless chest pain recur.       CTA 3/18/19IMPRESSION: 1. No acute pulmonary embolus. 2. No active pulmonary disease. 3. Right upper quadrant cyst likely arising the right kidney, incompletely included measuring at least 3 3 cm   Aortic Atherosclerosis (ICD10-I70.0).     Electronically Signed   By: Ashley Royalty M.D.   On: 10/08/2017 00:50   IMPRESSION: Brain MRI:   1. No acute finding. 2. Minimal white matter disease, likely remote microvascular ischemia.   Intracranial MRA:   Atheromatous type narrowing of the mid basilar that is mild. Atheromatous change also likely present in distal left PCA branches.   Neck MRA:   Negative.     Electronically Signed   By: Monte Fantasia M.D.   On: 09/17/2017 07:36       Labs/Other Tests and Data Reviewed:    EKG:  No ECG reviewed.  Recent Labs: No  results found for requested labs within last 8760 hours.   Recent Lipid Panel Lab Results  Component Value Date/Time   CHOL 146 09/17/2017 08:43 AM   TRIG 126 09/17/2017 08:43 AM   HDL 39 (L) 09/17/2017 08:43 AM   CHOLHDL 3.7 09/17/2017 08:43 AM   LDLCALC 82 09/17/2017 08:43 AM    Wt Readings from Last 3 Encounters:  04/30/19 214 lb (97.1 kg)  12/10/17 225 lb 6.4 oz (102.2 kg)  11/30/17 223 lb 8 oz (101.4 kg)     Objective:    Vital Signs:  BP 140/90   Ht 5' 11.5" (1.816 m)   Wt 214 lb (97.1 kg)   BMI 29.43 kg/m    VITAL SIGNS:  reviewed GEN:  no acute distress RESPIRATORY:  normal respiratory effort, symmetric expansion CARDIOVASCULAR:  no peripheral edema  ASSESSMENT & PLAN:    1. CAD with History of chest pain felt secondary to poorly controlled hypertension.  Coronary CT 09/2017 without obstructive disease. No angina 2. History of syncope felt to be vasovagal while standing for long period of time and preaching-no recurrence 3. Essential hypertension BP up today but hasn't taken his meds yet and usually normal. Check BP 3-4 times/week and call if elevated  4. Diabetes mellitus check A1C on lipitor  COVID-19 Education: The signs and symptoms of COVID-19 were discussed with the patient and how to seek care for testing (follow up with PCP or arrange E-visit).   The importance of social distancing was discussed today.  Time:   Today, I have spent 12:57minutes with the patient with telehealth technology discussing the above problems.     Medication Adjustments/Labs and Tests Ordered: Current medicines are reviewed at length with the patient today.  Concerns regarding medicines are outlined above.   Tests Ordered: No orders of the defined types were placed in this encounter.   Medication Changes: No orders of the defined types were placed in this encounter.   Follow Up:  Virtual Visit or in person in 1 year(s) Dr. Radford Pax  Signed, Ermalinda Barrios, PA-C  04/30/2019 9:24 AM    [place]

## 2019-04-29 NOTE — Telephone Encounter (Signed)

## 2019-04-30 ENCOUNTER — Other Ambulatory Visit: Payer: Self-pay

## 2019-04-30 ENCOUNTER — Encounter: Payer: Self-pay | Admitting: Physician Assistant

## 2019-04-30 ENCOUNTER — Telehealth (INDEPENDENT_AMBULATORY_CARE_PROVIDER_SITE_OTHER): Payer: 59 | Admitting: Physician Assistant

## 2019-04-30 VITALS — BP 140/90 | Ht 71.5 in | Wt 214.0 lb

## 2019-04-30 DIAGNOSIS — I10 Essential (primary) hypertension: Secondary | ICD-10-CM | POA: Diagnosis not present

## 2019-04-30 DIAGNOSIS — E669 Obesity, unspecified: Secondary | ICD-10-CM | POA: Diagnosis not present

## 2019-04-30 DIAGNOSIS — R55 Syncope and collapse: Secondary | ICD-10-CM

## 2019-04-30 DIAGNOSIS — I2510 Atherosclerotic heart disease of native coronary artery without angina pectoris: Secondary | ICD-10-CM

## 2019-04-30 DIAGNOSIS — E1169 Type 2 diabetes mellitus with other specified complication: Secondary | ICD-10-CM

## 2019-04-30 MED ORDER — LISINOPRIL 40 MG PO TABS: 40.0000 mg | ORAL_TABLET | Freq: Every day | ORAL | 3 refills | Status: DC

## 2019-04-30 MED ORDER — ATORVASTATIN CALCIUM 10 MG PO TABS: 10.0000 mg | ORAL_TABLET | Freq: Every day | ORAL | 3 refills | Status: DC

## 2019-04-30 NOTE — Patient Instructions (Signed)
Medication Instructions:  Your physician recommends that you continue on your current medications as directed. Please refer to the Current Medication list given to you today.  If you need a refill on your cardiac medications before your next appointment, please call your pharmacy.   Lab work: Your physician recommends that you return for a FASTING LIPIDS, CBC, CMET, TSH, and A1C in [place]   If you have labs (blood work) drawn today and your tests are completely normal, you will receive your results only by: Marland Kitchen MyChart Message (if you have MyChart) OR . A paper copy in the mail If you have any lab test that is abnormal or we need to change your treatment, we will call you to review the results.  Testing/Procedures: None ordered  Follow-Up: At [facility], you and your health needs are our priority.  As part of our continuing mission to provide you with exceptional heart care, we have created designated Provider Care Teams.  These Care Teams include your primary Cardiologist (physician) and Advanced Practice Providers (APPs -  Physician Assistants and Nurse Practitioners) who all work together to provide you with the care you need, when you need it. . You will need a follow up appointment in 1 year.  Please call our office 2 months in advance to schedule this appointment.  You may see Fransico Him, MD or one of the following Advanced Practice Providers on your designated Care Team:   . Lyda Jester, PA-C . Dayna Dunn, PA-C . Ermalinda Barrios, PA-C  Any Other Special Instructions Will Be Listed Below (If Applicable).  Monitor your Blood Pressure 3-4 times a week and call if consistently greater than 135/85

## 2019-05-13 ENCOUNTER — Other Ambulatory Visit: Payer: Self-pay | Admitting: Physician Assistant

## 2019-05-13 MED ORDER — LISINOPRIL 40 MG PO TABS: 40.0000 mg | ORAL_TABLET | Freq: Every day | ORAL | 3 refills | Status: DC

## 2019-05-13 MED FILL — LISINOPRIL 40 MG TABLET: 40 | 90 days supply | Qty: 90 | Fill #0

## 2019-05-13 MED FILL — ATORVASTATIN 10 MG TABLET: 10 | 15 days supply | Qty: 15 | Fill #2

## 2019-06-09 DIAGNOSIS — E039 Hypothyroidism, unspecified: Secondary | ICD-10-CM | POA: Insufficient documentation

## 2019-06-11 ENCOUNTER — Other Ambulatory Visit: Payer: Self-pay | Admitting: Physician Assistant

## 2019-06-11 MED ORDER — ATORVASTATIN CALCIUM 10 MG PO TABS: 10.0000 mg | ORAL_TABLET | Freq: Every day | ORAL | 3 refills | Status: DC

## 2019-06-11 MED FILL — ATORVASTATIN 10 MG TABLET: 10 | 90 days supply | Qty: 90 | Fill #0

## 2019-06-11 MED FILL — JANUMET 50-1,000 MG TABLET: 50-1000 | 60 days supply | Qty: 120 | Fill #2

## 2019-06-12 ENCOUNTER — Encounter: Payer: Self-pay | Admitting: Family Medicine

## 2019-06-12 ENCOUNTER — Other Ambulatory Visit: Payer: Self-pay

## 2019-06-12 ENCOUNTER — Ambulatory Visit (INDEPENDENT_AMBULATORY_CARE_PROVIDER_SITE_OTHER): Payer: 59 | Admitting: Family Medicine

## 2019-06-12 VITALS — BP 169/91 | HR 71 | Temp 98.0°F | Ht 71.5 in | Wt 219.2 lb

## 2019-06-12 DIAGNOSIS — Z23 Encounter for immunization: Secondary | ICD-10-CM

## 2019-06-12 DIAGNOSIS — Z1211 Encounter for screening for malignant neoplasm of colon: Secondary | ICD-10-CM | POA: Diagnosis not present

## 2019-06-12 DIAGNOSIS — E1169 Type 2 diabetes mellitus with other specified complication: Secondary | ICD-10-CM | POA: Diagnosis not present

## 2019-06-12 DIAGNOSIS — Z125 Encounter for screening for malignant neoplasm of prostate: Secondary | ICD-10-CM | POA: Diagnosis not present

## 2019-06-12 DIAGNOSIS — E039 Hypothyroidism, unspecified: Secondary | ICD-10-CM

## 2019-06-12 DIAGNOSIS — I10 Essential (primary) hypertension: Secondary | ICD-10-CM

## 2019-06-12 DIAGNOSIS — G250 Essential tremor: Secondary | ICD-10-CM | POA: Diagnosis not present

## 2019-06-12 DIAGNOSIS — E669 Obesity, unspecified: Secondary | ICD-10-CM

## 2019-06-12 HISTORY — DX: Encounter for screening for malignant neoplasm of prostate: Z12.5

## 2019-06-12 HISTORY — DX: Encounter for immunization: Z23

## 2019-06-12 NOTE — Progress Notes (Signed)
New Patient Office Visit  Subjective:  Patient ID: Jim Salazar, male    DOB: July 16, 1951  Age: 68 y.o. MRN: [id number]  CC:  Chief Complaint  Patient presents with  . Establish Care  . Labs Only    time for labs to be done  . Diabetes    type 2   . Hypertension    HPI SUAN LINSEY presents for DM-taking medication-oral daily- 3/19 7.1% A!c-stable glucose readings, eye exam 3 months ago Sees cardiology-elevated blood pressure-CAD-virtual visit-asa, amlodipiine , lisinopril--stable per cardiology-pt takes blood pressure at home 120's/70's on home monitor-morning pressure-stable on current medications on home monitor and no changes at cardiology with treatment plan Sees eye doctor-my eye doctor -sees 3 months ago-diabetic eye exam -stable-no glaucoma/no cataracts Tremor-essential-no concerns-evaluation by neurology, +FH tremors Hyperlipidemia-atorvastatin-stable-long term medication, need lipid panel GERD-Dexilant-stable-no recent egd-no h/o colonoscopy-stable  Past Medical History:  Diagnosis Date  . CAD (coronary artery disease), native coronary artery    mild nonobstructive plaque in LCx and RCA and mildly obstructive minimally calcified plaque in mid LAD on coronary CTA 11/2017  . Depression   . Diabetes mellitus without complication ([place])   . Hypertension   . Hypothyroidism, unspecified   . Major depressive disorder, single episode, unspecified   . Obesity, unspecified   . Thyroid disease   . Tremor, essential 11/30/2017  . Type 2 diabetes mellitus with hyperglycemia [facility])     Past Surgical History:  Procedure Laterality Date  . APPENDECTOMY    . TONSILLECTOMY      Family History  Problem Relation Age of Onset  . Hypertension Other   . Diabetes Mellitus II Mother   . Diabetes Mellitus II Father     Social History   Socioeconomic History  . Marital status: Married    Spouse name: Not on file  . Number of children: 2  . Years of education: College  .  Highest education level: Not on file  Occupational History  . Not on file  Social Needs  . Financial resource strain: Not on file  . Food insecurity    Worry: Not on file    Inability: Not on file  . Transportation needs    Medical: Not on file    Non-medical: Not on file  Tobacco Use  . Smoking status: Never Smoker  . Smokeless tobacco: Never Used  Substance and Sexual Activity  . Alcohol use: No    Frequency: Never  . Drug use: No  . Sexual activity: Not on file  Lifestyle  . Physical activity    Days per week: Not on file    Minutes per session: Not on file  . Stress: Not on file  Relationships  . Social Herbalist on phone: Not on file    Gets together: Not on file    Attends religious service: Not on file    Active member of club or organization: Not on file    Attends meetings of clubs or organizations: Not on file    Relationship status: Not on file  . Intimate partner violence    Fear of current or ex partner: Not on file    Emotionally abused: Not on file    Physically abused: Not on file    Forced sexual activity: Not on file  Other Topics Concern  . Not on file  Social History Narrative   Lives with wife   Caffeine use: 1-2 cups decaf coffee daily   Right  handed     ROS Review of Systems  Constitutional: Positive for chills.  HENT: Negative.   Eyes:       Eye exam recently  Respiratory: Negative.   Cardiovascular:       CAD  Gastrointestinal:       GERD  Endocrine:       DM hypothyroid  Musculoskeletal: Positive for arthralgias.       Knee pain-aleve  Allergic/Immunologic: Positive for environmental allergies.       Zyrtec  Neurological: Positive for tremors.  Hematological: Negative.     Objective:   Today's Vitals: BP (!) 169/91 (BP Location: Left Arm, Patient Position: Sitting, Cuff Size: Normal)   Pulse 71   Temp 98 F (36.7 C) (Oral)   Ht 5' 11.5" (1.816 m)   Wt 219 lb 3.2 oz (99.4 kg)   SpO2 96%   BMI 30.15 kg/m    Physical Exam Constitutional:      Appearance: Normal appearance.  HENT:     Head: Normocephalic and atraumatic.     Right Ear: Tympanic membrane normal.     Left Ear: Tympanic membrane normal.  Eyes:     Conjunctiva/sclera: Conjunctivae normal.  Neck:     Musculoskeletal: Normal range of motion and neck supple.  Cardiovascular:     Rate and Rhythm: Normal rate.     Pulses: Normal pulses.     Heart sounds: Normal heart sounds.  Pulmonary:     Effort: Pulmonary effort is normal.     Breath sounds: Normal breath sounds.  Musculoskeletal: Normal range of motion.  Neurological:     Mental Status: He is alert and oriented to person, place, and time.  Psychiatric:        Mood and Affect: Mood normal.        Behavior: Behavior normal.     Assessment & Plan:   1. Screening for colon cancer No prior h/o colon cancer screening-no stool changes - Ambulatory referral to Gastroenterology  2. Essential hypertension Daily bp check-slightly elevated today, normal at home, cmp, previously normal renal function, reviewed echo, ecg-stable CMP - CBC - Microalbumin, urine  3. Diabetes mellitus type 2 in obese (HCC) Oral meds only-pt does not check glucose readings, A1c pending - COMPLETE METABOLIC PANEL WITH GFR - Lipid panel - Hemoglobin A1c - CBC  4. Tremor, essential Previous evaluation neuro-+FH essential tremor - CBC  5. Hypothyroidism, unspecified type No recent change in dose-yearly TSH to monitor - TSH - CBC  6. Screening for prostate cancer D/w pt -no recent evaluation-pt understand risk/benefit of screening. If elevated PSA, pt agrees to referral to urology for additional evaluation - PSA  7. Need for immunization against influenza - Flu Vaccine QUAD 36+ mos IM  8. Need for vaccination against Streptococcus pneumoniae using pneumococcal conjugate vaccine 13 Previous 23-pt understands risk/benefit - Pneumococcal conjugate vaccine 13-valent  Paxil started  after pts wife died-no current concerns-d/w pt weaning if decides to discontinue-no screening for anxiety today Outpatient Encounter Medications as of 06/12/2019  Medication Sig  . levothyroxine (SYNTHROID) 112 MCG tablet Take 112 mcg by mouth daily before breakfast.  . amLODipine (NORVASC) 10 MG tablet Take 1 tablet (10 mg total) by mouth daily. AT BEDTIME. Please make overdue appt with Dr.Turner before anymore refills.3rd and Final Attempt  . aspirin EC 81 MG tablet Take 81 mg by mouth every evening.  Marland Kitchen atorvastatin (LIPITOR) 10 MG tablet Take 1 tablet (10 mg total) by mouth daily.  . cetirizine (  ZYRTEC) 10 MG tablet Take 10 mg by mouth daily as needed (for seasonal allergies).  Marland Kitchen dexlansoprazole (DEXILANT) 60 MG capsule Take 60 mg by mouth as needed. FOR REFLUX  . ibuprofen (ADVIL,MOTRIN) 200 MG tablet Take 400 mg by mouth as needed (for headaches).   Marland Kitchen JANUMET 50-1000 MG tablet Take 1 tablet by mouth 2 (two) times daily.  Marland Kitchen lisinopril (ZESTRIL) 40 MG tablet Take 1 tablet (40 mg total) by mouth daily.  Marland Kitchen PARoxetine (PAXIL) 20 MG tablet Take 20 mg by mouth daily.   No facility-administered encounter medications on file as of 06/12/2019.     Follow-up: fasting labwork, bp readings, keep GI appointment for discussion about colonoscopy  Kaya Pottenger Hannah Beat, MD

## 2019-06-12 NOTE — Patient Instructions (Addendum)
Fasting lab work

## 2019-06-16 ENCOUNTER — Encounter (INDEPENDENT_AMBULATORY_CARE_PROVIDER_SITE_OTHER): Payer: Self-pay | Admitting: *Deleted

## 2019-06-27 MED FILL — AMLODIPINE BESYLATE 10 MG T: 10 | 90 days supply | Qty: 90 | Fill #2

## 2019-07-24 MED FILL — LEVOTHYROXINE SODIUM 112 MC: 112 | 90 days supply | Qty: 90 | Fill #0

## 2019-07-24 MED FILL — PARoxetine HCL 20 MG TABS: 20 | 90 days supply | Qty: 90 | Fill #0

## 2019-08-26 MED FILL — JANUMET 50-1,000 MG TABLET: 50-1000 | 60 days supply | Qty: 120 | Fill #3

## 2019-09-22 ENCOUNTER — Ambulatory Visit (INDEPENDENT_AMBULATORY_CARE_PROVIDER_SITE_OTHER): Payer: 59 | Admitting: Family Medicine

## 2019-09-22 ENCOUNTER — Other Ambulatory Visit: Payer: Self-pay

## 2019-09-22 ENCOUNTER — Encounter: Payer: Self-pay | Admitting: Family Medicine

## 2019-09-22 VITALS — BP 148/95 | HR 68 | Temp 98.1°F | Ht 72.0 in | Wt 217.2 lb

## 2019-09-22 DIAGNOSIS — I10 Essential (primary) hypertension: Secondary | ICD-10-CM

## 2019-09-22 DIAGNOSIS — Z125 Encounter for screening for malignant neoplasm of prostate: Secondary | ICD-10-CM

## 2019-09-22 DIAGNOSIS — E1169 Type 2 diabetes mellitus with other specified complication: Secondary | ICD-10-CM | POA: Diagnosis not present

## 2019-09-22 DIAGNOSIS — E039 Hypothyroidism, unspecified: Secondary | ICD-10-CM | POA: Diagnosis not present

## 2019-09-22 DIAGNOSIS — E669 Obesity, unspecified: Secondary | ICD-10-CM | POA: Diagnosis not present

## 2019-09-22 DIAGNOSIS — R1011 Right upper quadrant pain: Secondary | ICD-10-CM

## 2019-09-22 HISTORY — DX: Right upper quadrant pain: R10.11

## 2019-09-22 NOTE — Progress Notes (Signed)
Established Patient Office Visit  Subjective:  Patient ID: Jim Salazar, male    DOB: 08-05-1950  Age: 69 y.o. MRN: [id number]  CC:  Chief Complaint  Patient presents with  . GI Problem    RLQ PAIN    [name] presents for with belching and reflux over the past 3 weeks-nausea with vomiting worsening Ground beef-fried foods worse symptoms -went to [employer] after church eating 1/2 the burger and stopped due to nausea Pt waking from sleep with distended stomach and right upper quad pain with expansion.  Pt with no prior h/o nausea with vomiting. No diarrhea. Stools normal. No fever.  Pt with no increase use of Tums.  No heart burn-no throat burning.  Past Medical History:  Diagnosis Date  . CAD (coronary artery disease), native coronary artery    mild nonobstructive plaque in LCx and RCA and mildly obstructive minimally calcified plaque in mid LAD on coronary CTA 11/2017  . Depression   . Diabetes mellitus without complication ([place])   . Hypertension   . Hypothyroidism, unspecified   . Major depressive disorder, single episode, unspecified   . Obesity, unspecified   . Thyroid disease   . Tremor, essential 11/30/2017  . Type 2 diabetes mellitus with hyperglycemia [facility])     Past Surgical History:  Procedure Laterality Date  . APPENDECTOMY    . TONSILLECTOMY      Family History  Problem Relation Age of Onset  . Hypertension Other   . Diabetes Mellitus II Mother   . Diabetes Mellitus II Father     Social History   Socioeconomic History  . Marital status: Married    Spouse name: Not on file  . Number of children: 2  . Years of education: College  . Highest education level: Not on file  Occupational History  . Occupation: semi retired     Comment: Theme park manager   Tobacco Use  . Smoking status: Never Smoker  . Smokeless tobacco: Never Used  Substance and Sexual Activity  . Alcohol use: No  . Drug use: No  . Sexual activity: Not on file  Other Topics Concern  .  Not on file  Social History Narrative   Lives with wife   Caffeine use: 1-2 cups decaf coffee daily   Right handed    Social Determinants of Health   Financial Resource Strain:   . Difficulty of Paying Living Expenses: Not on file  Food Insecurity:   . Worried About Charity fundraiser in the Last Year: Not on file  . Ran Out of Food in the Last Year: Not on file  Transportation Needs:   . Lack of Transportation (Medical): Not on file  . Lack of Transportation (Non-Medical): Not on file  Physical Activity:   . Days of Exercise per Week: Not on file  . Minutes of Exercise per Session: Not on file  Stress:   . Feeling of Stress : Not on file  Social Connections:   . Frequency of Communication with Friends and Family: Not on file  . Frequency of Social Gatherings with Friends and Family: Not on file  . Attends Religious Services: Not on file  . Active Member of Clubs or Organizations: Not on file  . Attends Archivist Meetings: Not on file  . Marital Status: Not on file  Intimate Partner Violence:   . Fear of Current or Ex-Partner: Not on file  . Emotionally Abused: Not on file  . Physically Abused:  Not on file  . Sexually Abused: Not on file    Outpatient Medications Prior to Visit  Medication Sig Dispense Refill  . Accu-Chek FastClix Lancets MISC     . amLODipine (NORVASC) 10 MG tablet Take 1 tablet (10 mg total) by mouth daily. AT BEDTIME. Please make overdue appt with Dr.Turner before anymore refills.3rd and Final Attempt 15 tablet 0  . aspirin EC 81 MG tablet Take 81 mg by mouth every evening.    Marland Kitchen atorvastatin (LIPITOR) 10 MG tablet Take 1 tablet (10 mg total) by mouth daily. 90 tablet 3  . cetirizine (ZYRTEC) 10 MG tablet Take 10 mg by mouth daily as needed (for seasonal allergies).    Marland Kitchen dexlansoprazole (DEXILANT) 60 MG capsule Take 60 mg by mouth as needed. FOR REFLUX    . ibuprofen (ADVIL,MOTRIN) 200 MG tablet Take 400 mg by mouth as needed (for headaches).      Marland Kitchen JANUMET 50-1000 MG tablet Take 1 tablet by mouth 2 (two) times daily.    Marland Kitchen levothyroxine (SYNTHROID) 112 MCG tablet Take 112 mcg by mouth daily before breakfast.    . lisinopril (ZESTRIL) 40 MG tablet Take 1 tablet (40 mg total) by mouth daily. 90 tablet 3  . PARoxetine (PAXIL) 20 MG tablet Take 20 mg by mouth daily.     No facility-administered medications prior to visit.    No Known Allergies  ROS Review of Systems  Constitutional: Positive for fatigue.  Respiratory: Negative.   Cardiovascular: Negative.   Gastrointestinal: Positive for abdominal distention, abdominal pain, nausea and vomiting.  Endocrine:       DM  Genitourinary: Negative.   Allergic/Immunologic: Negative.   Neurological: Negative.   Psychiatric/Behavioral: Negative.       Objective:    Physical Exam  Constitutional: He is oriented to person, place, and time. He appears well-developed and well-nourished. No distress.  HENT:  Head: Normocephalic and atraumatic.  Eyes: Conjunctivae are normal.  Cardiovascular: Normal rate and regular rhythm.  Pulmonary/Chest: Effort normal and breath sounds normal.  Abdominal: He exhibits no distension. There is abdominal tenderness. There is no rebound.  Neurological: He is alert and oriented to person, place, and time.  Psychiatric: He has a normal mood and affect. His behavior is normal.    BP (!) 148/95 (BP Location: Left Arm, Patient Position: Sitting)   Pulse 68   Temp 98.1 F (36.7 C) (Temporal)   Ht 6' (1.829 m)   Wt 217 lb 3.2 oz (98.5 kg)   SpO2 96%   BMI 29.46 kg/m  Wt Readings from Last 3 Encounters:  09/22/19 217 lb 3.2 oz (98.5 kg)  06/12/19 219 lb 3.2 oz (99.4 kg)  04/30/19 214 lb (97.1 kg)     Health Maintenance Due  Topic Date Due  . Hepatitis C Screening  1951/05/23  . OPHTHALMOLOGY EXAM  09/05/1962  . TETANUS/TDAP  09/06/1971  . COLONOSCOPY  09/05/2002  . HEMOGLOBIN A1C  04/06/2018    Lab Results  Component Value Date   TSH  2.643 09/17/2017   Lab Results  Component Value Date   WBC 10.9 (H) 10/07/2017   HGB 14.5 10/07/2017   HCT 41.9 10/07/2017   MCV 84.8 10/07/2017   PLT 243 10/07/2017   Lab Results  Component Value Date   NA 134 10/17/2017   K 4.0 10/17/2017   CO2 26 10/17/2017   GLUCOSE 148 (H) 10/17/2017   BUN 17 10/17/2017   CREATININE 1.00 11/23/2017   BILITOT 1.4 (H) 09/17/2017  ALKPHOS 60 09/17/2017   AST 25 09/17/2017   ALT 16 (L) 09/17/2017   PROT 6.3 (L) 09/17/2017   ALBUMIN 3.7 09/17/2017   CALCIUM 9.6 10/17/2017   ANIONGAP 14 10/07/2017   Lab Results  Component Value Date   CHOL 136 10/04/2017   Lab Results  Component Value Date   HDL 38 10/04/2017   Lab Results  Component Value Date   LDLCALC 78 10/04/2017   Lab Results  Component Value Date   TRIG 118 10/04/2017   Lab Results  Component Value Date   CHOLHDL 3.7 09/17/2017   Lab Results  Component Value Date   HGBA1C 7.1 10/04/2017      Assessment & Plan:  1. Right upper quadrant abdominal pain Fried foods and heavy foods with belching, nausea and vomiting - US Abdomen Limited RUQ; Future - CBC w/Diff/Platelet - Amylase - Lipase 2. Diabetes mellitus type 2 in obese (HCC) - COMPLETE METABOLIC PANEL WITH GFR - Hemoglobin A1c  3. Essential hypertension Amlodipine/zestril-stable - Lipid panel  4. Hypothyroidism, unspecified type - TSH No recent labwork for stability 5. Screening for prostate cancer - PSA Follow-up:  Ultrasound-right upper quad 61minutes discussing history, exam, assessment and plan Srinivas Lippman Hannah Beat, MD

## 2019-09-22 NOTE — Patient Instructions (Signed)
Abdominal ultrasound-right upper quad pain  Fasting labwork

## 2019-09-23 ENCOUNTER — Telehealth: Payer: Self-pay | Admitting: Emergency Medicine

## 2019-09-23 DIAGNOSIS — R1011 Right upper quadrant pain: Secondary | ICD-10-CM | POA: Diagnosis not present

## 2019-09-23 DIAGNOSIS — I10 Essential (primary) hypertension: Secondary | ICD-10-CM | POA: Diagnosis not present

## 2019-09-23 DIAGNOSIS — E039 Hypothyroidism, unspecified: Secondary | ICD-10-CM | POA: Diagnosis not present

## 2019-09-23 DIAGNOSIS — Z125 Encounter for screening for malignant neoplasm of prostate: Secondary | ICD-10-CM | POA: Diagnosis not present

## 2019-09-23 DIAGNOSIS — E1169 Type 2 diabetes mellitus with other specified complication: Secondary | ICD-10-CM | POA: Diagnosis not present

## 2019-09-23 DIAGNOSIS — E669 Obesity, unspecified: Secondary | ICD-10-CM | POA: Diagnosis not present

## 2019-09-23 NOTE — Telephone Encounter (Signed)
I left a msg on patient machine that I have him scheduled for his [place] on 09/26/19 @ 10:30 for him to be there 15 min before his appt and do not eat a drink anything after mid night the night before his [place].([employer]). Patient was informed to return my call so I know he received this msg.

## 2019-09-24 ENCOUNTER — Other Ambulatory Visit: Payer: Self-pay

## 2019-09-24 ENCOUNTER — Encounter: Payer: Self-pay | Admitting: Family Medicine

## 2019-09-24 ENCOUNTER — Ambulatory Visit (INDEPENDENT_AMBULATORY_CARE_PROVIDER_SITE_OTHER): Payer: 59 | Admitting: Family Medicine

## 2019-09-24 VITALS — BP 162/100 | HR 77 | Temp 97.8°F

## 2019-09-24 DIAGNOSIS — E039 Hypothyroidism, unspecified: Secondary | ICD-10-CM

## 2019-09-24 DIAGNOSIS — E669 Obesity, unspecified: Secondary | ICD-10-CM

## 2019-09-24 DIAGNOSIS — E1169 Type 2 diabetes mellitus with other specified complication: Secondary | ICD-10-CM

## 2019-09-24 LAB — COMPLETE METABOLIC PANEL WITH GFR
AG Ratio: 2.2 (calc) (ref 1.0–2.5)
ALT: 11 U/L (ref 9–46)
AST: 11 U/L (ref 10–35)
Albumin: 4.3 g/dL (ref 3.6–5.1)
Alkaline phosphatase (APISO): 72 U/L (ref 35–144)
BUN: 19 mg/dL (ref 7–25)
CO2: 29 mmol/L (ref 20–32)
Calcium: 9.4 mg/dL (ref 8.6–10.3)
Chloride: 97 mmol/L — ABNORMAL LOW (ref 98–110)
Creat: 1.03 mg/dL (ref 0.70–1.25)
GFR, Est African American: 87 mL/min/{1.73_m2} (ref >=60)
GFR, Est Non African American: 75 mL/min/{1.73_m2} (ref >=60)
Globulin: 2 g/dL (calc) (ref 1.9–3.7)
Glucose, Bld: 200 mg/dL — ABNORMAL HIGH (ref 65–99)
Potassium: 3.8 mmol/L (ref 3.5–5.3)
Sodium: 137 mmol/L (ref 135–146)
Total Bilirubin: 1.4 mg/dL — ABNORMAL HIGH (ref 0.2–1.2)
Total Protein: 6.3 g/dL (ref 6.1–8.1)

## 2019-09-24 LAB — TSH: TSH: 7.26 mIU/L — ABNORMAL HIGH (ref 0.40–4.50)

## 2019-09-24 LAB — CBC WITH DIFFERENTIAL/PLATELET
Absolute Monocytes: 589 cells/uL (ref 200–950)
Basophils Absolute: 83 cells/uL (ref 0–200)
Basophils Relative: 1 %
Eosinophils Absolute: 183 cells/uL (ref 15–500)
Eosinophils Relative: 2.2 %
HCT: 43.7 % (ref 38.5–50.0)
Hemoglobin: 14.8 g/dL (ref 13.2–17.1)
Lymphs Abs: 1901 cells/uL (ref 850–3900)
MCH: 29.4 pg (ref 27.0–33.0)
MCHC: 33.9 g/dL (ref 32.0–36.0)
MCV: 86.9 fL (ref 80.0–100.0)
MPV: 10.9 fL (ref 7.5–12.5)
Monocytes Relative: 7.1 %
Neutro Abs: 5544 cells/uL (ref 1500–7800)
Neutrophils Relative %: 66.8 %
Platelets: 250 10*3/uL (ref 140–400)
RBC: 5.03 10*6/uL (ref 4.20–5.80)
RDW: 12.4 % (ref 11.0–15.0)
Total Lymphocyte: 22.9 %
WBC: 8.3 10*3/uL (ref 3.8–10.8)

## 2019-09-24 LAB — PSA: PSA: 1.4 ng/mL (ref <=4.0)

## 2019-09-24 LAB — HEMOGLOBIN A1C
Hgb A1c MFr Bld: 9.7 % of total Hgb — ABNORMAL HIGH (ref <5.7)
Mean Plasma Glucose: 232 (calc)
eAG (mmol/L): 12.8 (calc)

## 2019-09-24 LAB — LIPID PANEL
Cholesterol: 107 mg/dL (ref <200)
HDL: 36 mg/dL — ABNORMAL LOW (ref >=40)
LDL Cholesterol (Calc): 47 mg/dL (calc)
Non-HDL Cholesterol (Calc): 71 mg/dL (calc) (ref <130)
Total CHOL/HDL Ratio: 3 (calc) (ref <5.0)
Triglycerides: 163 mg/dL — ABNORMAL HIGH (ref <150)

## 2019-09-24 LAB — LIPASE: Lipase: 15 U/L (ref 7–60)

## 2019-09-24 LAB — AMYLASE: Amylase: 27 U/L (ref 21–101)

## 2019-09-24 MED ORDER — LEVOTHYROXINE SODIUM 125 MCG PO TABS: 125.0000 ug | ORAL_TABLET | Freq: Every day | ORAL | 1 refills | Status: DC

## 2019-09-24 NOTE — Telephone Encounter (Signed)
Patient was informed.

## 2019-09-24 NOTE — Progress Notes (Signed)
Established Patient Office Visit  Subjective:  Patient ID: Jim Salazar, male    DOB: [birth year]  Age: 69 y.o. MRN: [id number]  CC:  Chief Complaint  Patient presents with  . Abdominal Pain    ruq, f/u labs  elevated A1c -9/7and glucose 200 Elevated TSH 7.26 [name] presents for glucose 190's fasting-post prandial 160-180 glucose readings Pt with concern for abdominal pain-appt scheduled  Past Medical History:  Diagnosis Date  . CAD (coronary artery disease), native coronary artery    mild nonobstructive plaque in LCx and RCA and mildly obstructive minimally calcified plaque in mid LAD on coronary CTA 11/2017  . Depression   . Diabetes mellitus without complication ([place])   . Hypertension   . Hypothyroidism, unspecified   . Major depressive disorder, single episode, unspecified   . Obesity, unspecified   . Thyroid disease   . Tremor, essential 11/30/2017  . Type 2 diabetes mellitus with hyperglycemia [facility])     Past Surgical History:  Procedure Laterality Date  . APPENDECTOMY    . TONSILLECTOMY      Family History  Problem Relation Age of Onset  . Hypertension Other   . Diabetes Mellitus II Mother   . Diabetes Mellitus II Father     Social History   Socioeconomic History  . Marital status: Married    Spouse name: Not on file  . Number of children: 2  . Years of education: College  . Highest education level: Not on file  Occupational History  . Occupation: semi retired     Comment: Theme park manager   Tobacco Use  . Smoking status: Never Smoker  . Smokeless tobacco: Never Used  Substance and Sexual Activity  . Alcohol use: No  . Drug use: No  . Sexual activity: Not on file  Other Topics Concern  . Not on file  Social History Narrative   Lives with wife   Caffeine use: 1-2 cups decaf coffee daily   Right handed    Social Determinants of Health   Financial Resource Strain:   . Difficulty of Paying Living Expenses: Not on file  Food Insecurity:    . Worried About Charity fundraiser in the Last Year: Not on file  . Ran Out of Food in the Last Year: Not on file  Transportation Needs:   . Lack of Transportation (Medical): Not on file  . Lack of Transportation (Non-Medical): Not on file  Physical Activity:   . Days of Exercise per Week: Not on file  . Minutes of Exercise per Session: Not on file  Stress:   . Feeling of Stress : Not on file  Social Connections:   . Frequency of Communication with Friends and Family: Not on file  . Frequency of Social Gatherings with Friends and Family: Not on file  . Attends Religious Services: Not on file  . Active Member of Clubs or Organizations: Not on file  . Attends Archivist Meetings: Not on file  . Marital Status: Not on file  Intimate Partner Violence:   . Fear of Current or Ex-Partner: Not on file  . Emotionally Abused: Not on file  . Physically Abused: Not on file  . Sexually Abused: Not on file    Outpatient Medications Prior to Visit  Medication Sig Dispense Refill  . Accu-Chek FastClix Lancets MISC     . amLODipine (NORVASC) 10 MG tablet Take 1 tablet (10 mg total) by mouth daily. AT BEDTIME. Please make overdue  appt with Dr.Turner before anymore refills.3rd and Final Attempt 15 tablet 0  . aspirin EC 81 MG tablet Take 81 mg by mouth every evening.    Marland Kitchen atorvastatin (LIPITOR) 10 MG tablet Take 1 tablet (10 mg total) by mouth daily. 90 tablet 3  . cetirizine (ZYRTEC) 10 MG tablet Take 10 mg by mouth daily as needed (for seasonal allergies).    Marland Kitchen dexlansoprazole (DEXILANT) 60 MG capsule Take 60 mg by mouth as needed. FOR REFLUX    . ibuprofen (ADVIL,MOTRIN) 200 MG tablet Take 400 mg by mouth as needed (for headaches).     Marland Kitchen JANUMET 50-1000 MG tablet Take 1 tablet by mouth 2 (two) times daily.    Marland Kitchen levothyroxine (SYNTHROID) 112 MCG tablet Take 112 mcg by mouth daily before breakfast.    . lisinopril (ZESTRIL) 40 MG tablet Take 1 tablet (40 mg total) by mouth daily. 90  tablet 3  . PARoxetine (PAXIL) 20 MG tablet Take 20 mg by mouth daily.     No facility-administered medications prior to visit.    No Known Allergies  ROS Review of Systems  Endocrine:       DM hypothyroid      Objective:    Physical Exam DISCUSSION ABOUT LABWORK-14min BP (!) 162/100 (BP Location: Left Arm, Patient Position: Sitting)   Pulse 77   Temp 97.8 F (36.6 C) (Temporal)   SpO2 95%  Wt Readings from Last 3 Encounters:  09/22/19 217 lb 3.2 oz (98.5 kg)  06/12/19 219 lb 3.2 oz (99.4 kg)  04/30/19 214 lb (97.1 kg)     Health Maintenance Due  Topic Date Due  . Hepatitis C Screening  Oct 19, 1950  . OPHTHALMOLOGY EXAM  09/05/1962  . TETANUS/TDAP  09/06/1971  . COLONOSCOPY  09/05/2002    There are no preventive care reminders to display for this patient.  Lab Results  Component Value Date   TSH 7.26 (H) 09/23/2019   Lab Results  Component Value Date   WBC 8.3 09/23/2019   HGB 14.8 09/23/2019   HCT 43.7 09/23/2019   MCV 86.9 09/23/2019   PLT 250 09/23/2019   Lab Results  Component Value Date   NA 137 09/23/2019   K 3.8 09/23/2019   CO2 29 09/23/2019   GLUCOSE 200 (H) 09/23/2019   BUN 19 09/23/2019   CREATININE 1.03 09/23/2019   BILITOT 1.4 (H) 09/23/2019   ALKPHOS 60 09/17/2017   AST 11 09/23/2019   ALT 11 09/23/2019   PROT 6.3 09/23/2019   ALBUMIN 3.7 09/17/2017   CALCIUM 9.4 09/23/2019   ANIONGAP 14 10/07/2017   Lab Results  Component Value Date   CHOL 107 09/23/2019   Lab Results  Component Value Date   HDL 36 (L) 09/23/2019   Lab Results  Component Value Date   LDLCALC 47 09/23/2019   Lab Results  Component Value Date   TRIG 163 (H) 09/23/2019   Lab Results  Component Value Date   CHOLHDL 3.0 09/23/2019   Lab Results  Component Value Date   HGBA1C 9.7 (H) 09/23/2019      Assessment & Plan:   1. Hypothyroidism, unspecified type D/w pt increase medication for daily use - TSH - Ambulatory referral to diabetic  education  2. Diabetes mellitus type 2 in obese ([place]) Janumet daily-d/w pt at length additional options-ozempic, basal insulin -pt will read on options and f/u with endo. Food log recommended  Ambulatory referral to diabetic educationendo/nutrition referral   Yahmir Sokolov Hannah Beat, MD

## 2019-09-24 NOTE — Patient Instructions (Signed)
Check glucose fasting and before lunch/dinner/2 hours after evening meal  Diabetic educator  Increase dose of thyroid medication to 187mcg  Recheck non fasting lab in 6 weeks

## 2019-09-26 ENCOUNTER — Other Ambulatory Visit: Payer: Self-pay

## 2019-09-26 ENCOUNTER — Ambulatory Visit (HOSPITAL_COMMUNITY)
Admission: RE | Admit: 2019-09-26 | Discharge: 2019-09-26 | Disposition: A | Discharge status: Home / Self Care | Payer: 59 | Source: Ambulatory Visit | Attending: Family Medicine | Admitting: Family Medicine

## 2019-09-26 DIAGNOSIS — R1011 Right upper quadrant pain: Secondary | ICD-10-CM | POA: Diagnosis not present

## 2019-09-26 DIAGNOSIS — K7689 Other specified diseases of liver: Secondary | ICD-10-CM | POA: Diagnosis not present

## 2019-09-29 MED FILL — LEVOTHYROXINE SODIUM 125 MC: 125 | 30 days supply | Qty: 30 | Fill #0

## 2019-09-29 MED FILL — LISINOPRIL 40 MG TABLET: 40 | 90 days supply | Qty: 90 | Fill #1

## 2019-10-17 DIAGNOSIS — Z23 Encounter for immunization: Secondary | ICD-10-CM | POA: Diagnosis not present

## 2019-10-22 ENCOUNTER — Encounter: Payer: Self-pay | Admitting: Nutrition

## 2019-10-22 ENCOUNTER — Encounter: Payer: 59 | Attending: Family Medicine | Admitting: Nutrition

## 2019-10-22 ENCOUNTER — Telehealth: Payer: Self-pay | Admitting: Nutrition

## 2019-10-22 ENCOUNTER — Other Ambulatory Visit: Payer: Self-pay

## 2019-10-22 VITALS — Ht 72.0 in | Wt 213.2 lb

## 2019-10-22 DIAGNOSIS — E669 Obesity, unspecified: Secondary | ICD-10-CM | POA: Insufficient documentation

## 2019-10-22 DIAGNOSIS — E119 Type 2 diabetes mellitus without complications: Secondary | ICD-10-CM

## 2019-10-22 DIAGNOSIS — I10 Essential (primary) hypertension: Secondary | ICD-10-CM

## 2019-10-22 DIAGNOSIS — E1169 Type 2 diabetes mellitus with other specified complication: Secondary | ICD-10-CM | POA: Insufficient documentation

## 2019-10-22 NOTE — Progress Notes (Signed)
Medical Nutrition Therapy:  Appt start time: 1030 end time:  1130.  First visit  Assessment:  Primary concerns today: Diabetes Type 2. He is a  Company secretary.  He lives with his wife. Eats out 50% of time. Janument 50/1000 BID. Linus Mako and rides exercise bike 20-30 minutes 3-5 times a week.  Has cut out eating past 7 pm. His blood sugars have improved drastically with cutting out after 7 and taking Janument 50/1000 mg BID.He feels better. Has cut out snacks between meals. Eating meals on time. Motivated to continue to improve eating habits and exercise to maintain good blood sugar control. Lost 4 lbs.  Lab Results  Component Value Date   HGBA1C 9.7 (H) 09/23/2019   CMP Latest Ref Rng & Units 09/23/2019 11/23/2017 10/17/2017  Glucose 65 - 99 mg/dL 200(H) - 148(H)  BUN 7 - 25 mg/dL 19 - 17  Creatinine 0.70 - 1.25 mg/dL 1.03 1.00 0.93  Sodium 135 - 146 mmol/L 137 - 134  Potassium 3.5 - 5.3 mmol/L 3.8 - 4.0  Chloride 98 - 110 mmol/L 97(L) - 93(L)  CO2 20 - 32 mmol/L 29 - 26  Calcium 8.6 - 10.3 mg/dL 9.4 - 9.6  Total Protein 6.1 - 8.1 g/dL 6.3 - -  Total Bilirubin 0.2 - 1.2 mg/dL 1.4(H) - -  Alkaline Phos 38 - 126 U/L - - -  AST 10 - 35 U/L 11 - -  ALT 9 - 46 U/L 11 - -   Lipid Panel     Component Value Date/Time   CHOL 107 09/23/2019 0746   TRIG 163 (H) 09/23/2019 0746   HDL 36 (L) 09/23/2019 0746   CHOLHDL 3.0 09/23/2019 0746   VLDL 25 09/17/2017 0843   LDLCALC 47 09/23/2019 0746      Preferred Learning Style:   No preference indicated   Learning Readiness:   Ready  Change in progress   MEDICATIONS:    DIETARY INTAKE:   24-hr recall:  B ( AM): Oatmeal 1 packet with pb toast, decaf coffee Snk ( AM):  L ( PM): Wheat chicken salad wrap, water, Snk ( PM): D ( PM): Homemade sloppy joes with [place] ground with steak fries in oven, water Snk ( PM):  Beverages: water  Usual physical activity: walking and stationary bike.  Estimated energy needs: 2000 calories 225 g  carbohydrates 150 g protein 56 g fat  Progress Towards Goal(s):  In progress.   Nutritional Diagnosis:  NB-1.1 Food and nutrition-related knowledge deficit As related to Diabetes Type 2.  As evidenced by A1C 9.7%.    Intervention:  Nutrition and Diabetes education provided on My Plate, CHO counting, meal planning, portion sizes, timing of meals, avoiding snacks between meals unless having a low blood sugar, target ranges for A1C and blood sugars, signs/symptoms and treatment of hyper/hypoglycemia, monitoring blood sugars, taking medications as prescribed, benefits of exercising 30 minutes per day and prevention of complications of DM.   Goals Keep not eating past 7 pm Eat More lower carb veggies  Eat 3-4 carb choices per meal Keep exercising! Keep up the great job! Aim for morning blood sugars less than 150 mg/dl. Get A1C to 7%   Teaching Method Utilized:   Visual Auditory Hands on  Handouts given during visit include:  The Plate Method   Meal Plan Card  Diabetes Instructions.  Barriers to learning/adherence to lifestyle change: non3e Demonstrated degree of understanding via:  Teach Back   Monitoring/Evaluation:  Dietary intake, exercise,  and  body weight in 3-4 month(s).

## 2019-10-22 NOTE — Telephone Encounter (Signed)
error 

## 2019-10-22 NOTE — Patient Instructions (Addendum)
Goals Keep not eating past 7 pm Eat More lower carb veggies  Eat 3-4 carb choices per meal Keep exercising! Keep up the great job! Aim for morning blood sugars less than 150 mg/dl. Get A1C to 7%

## 2019-10-27 MED FILL — LEVOTHYROXINE SODIUM 125 MC: 125 | 30 days supply | Qty: 30 | Fill #1

## 2019-10-27 MED FILL — ATORVASTATIN 10 MG TABLET: 10 | 90 days supply | Qty: 90 | Fill #1

## 2019-10-29 MED FILL — FREESTYLE LANCETS: 90 days supply | Qty: 200 | Fill #0

## 2019-10-29 MED FILL — FREESTYLE LITE METER: 20 days supply | Qty: 1 | Fill #0

## 2019-10-29 MED FILL — FREESTYLE LITE TEST STRIP: 90 days supply | Qty: 200 | Fill #0

## 2019-11-04 MED FILL — PARoxetine HCL 20 MG TABS: 20 | 90 days supply | Qty: 90 | Fill #1

## 2019-11-04 MED FILL — AMLODIPINE BESYLATE 10 MG T: 10 | 90 days supply | Qty: 90 | Fill #3

## 2019-11-05 IMAGING — DX DG CHEST 2V
2 series · 2 of 2 positions shown · non-contrast
Comparison: None.

CLINICAL DATA: Pt states he was [place] his sermon for
church when he began to experience left hand numbness that moved up
his arm, to his neck, and tongue. Pt states that his symptoms have
improved but states when he was walking into to the hospital he
started having left sided chest pain as well. pt states left sided
neck stiffness x the last 3 nights per pt. no prior chest hx per pt.

EXAM:
CHEST  2 VIEW

[w chest pa]
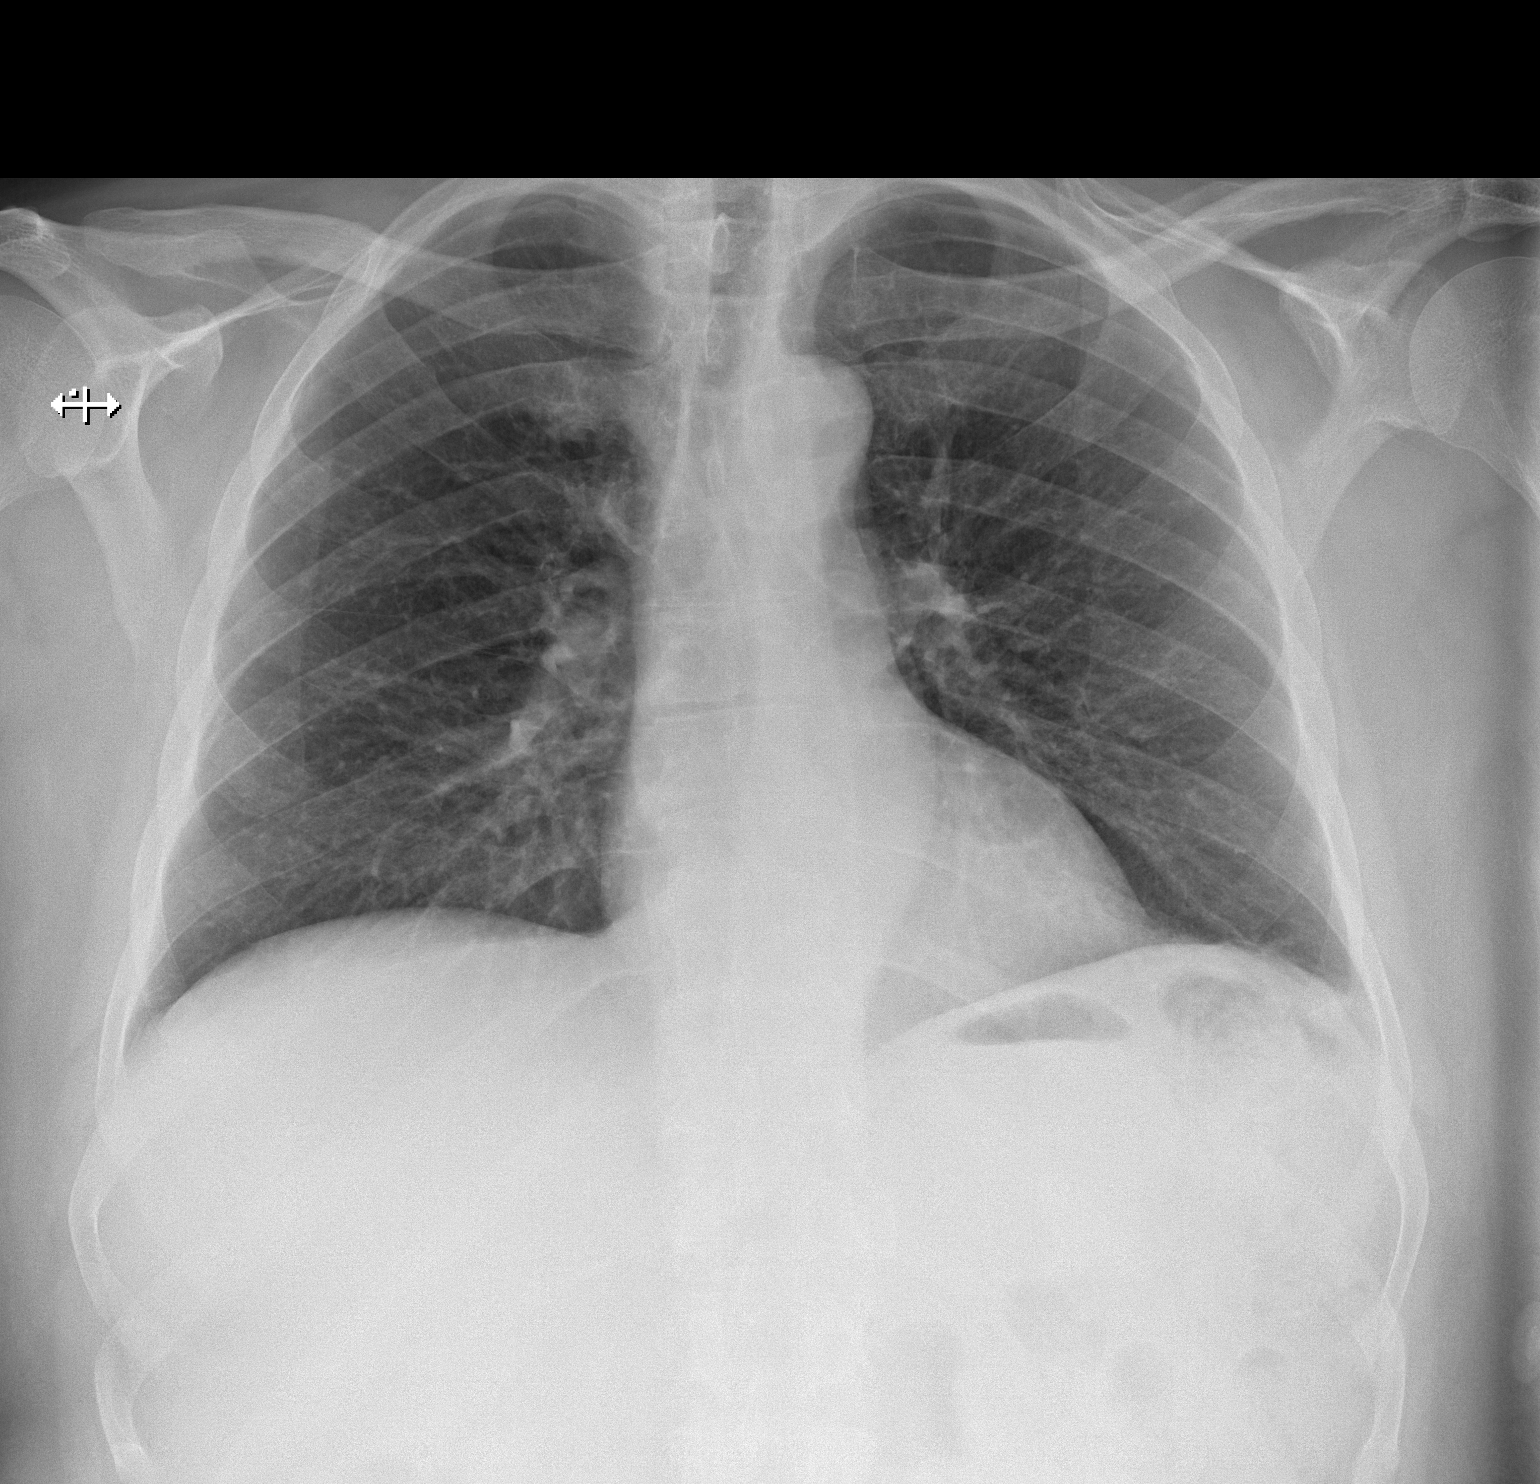

[w chest lat]
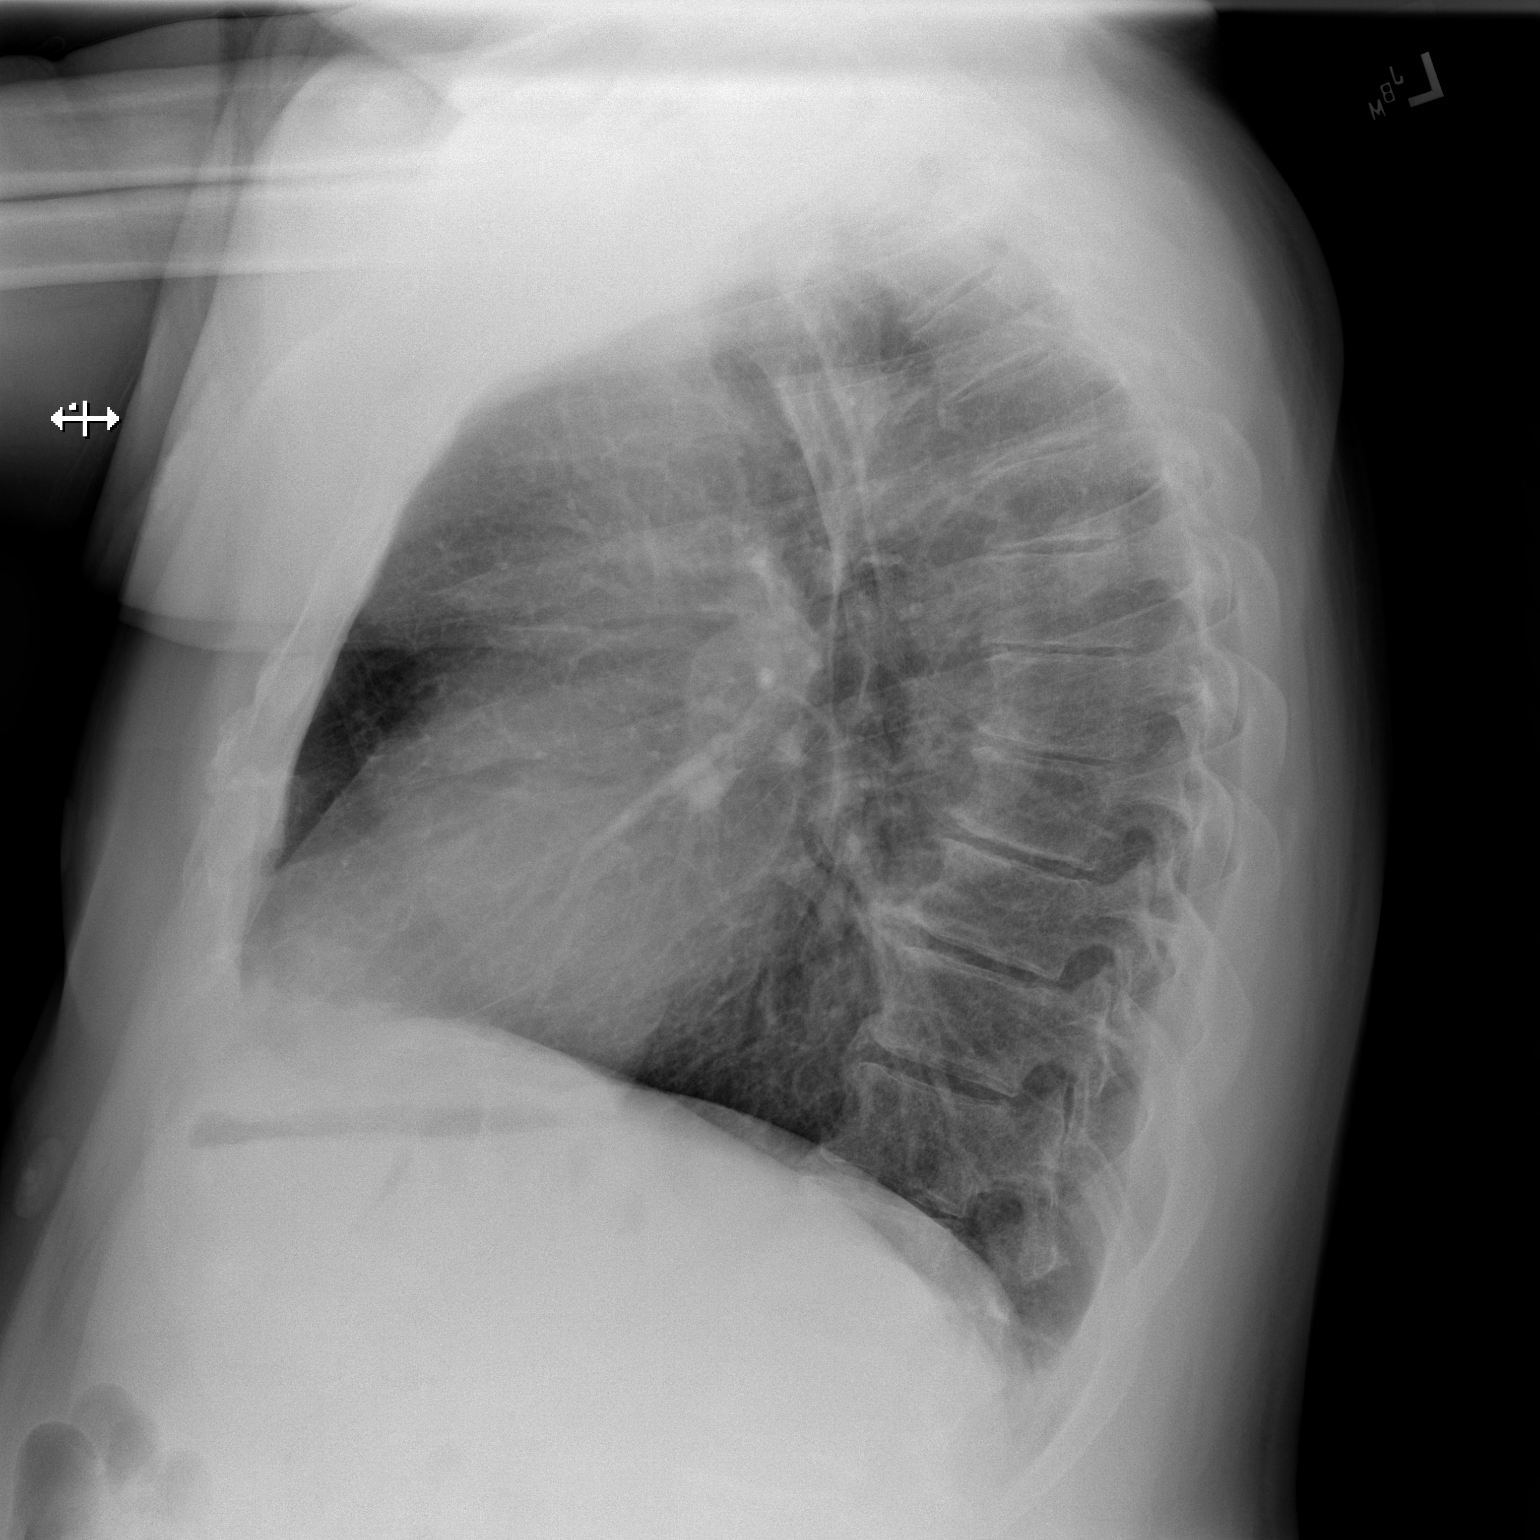

[2 of 2 positions shown; findings below may reference images not displayed]

FINDINGS: Cardiac silhouette is normal size and configuration. No mediastinal
or hilar masses. There is no evidence of adenopathy.

Lungs are clear.

No pleural effusion or pneumothorax.

Skeletal structures are intact.
IMPRESSION: No active cardiopulmonary disease.

## 2019-11-05 MED FILL — JANUMET 50-1,000 MG TABLET: 50-1000 | 60 days supply | Qty: 120 | Fill #4

## 2019-11-06 IMAGING — DX DG ORBITS FOR FOREIGN BODY
2 series · 2 of 2 positions shown · non-contrast
Comparison: None.

CLINICAL DATA: Metal working/exposure; clearance prior to MRI

EXAM:
ORBITS FOR FOREIGN BODY - 2 VIEW

[orbits waters (1 of 2)]
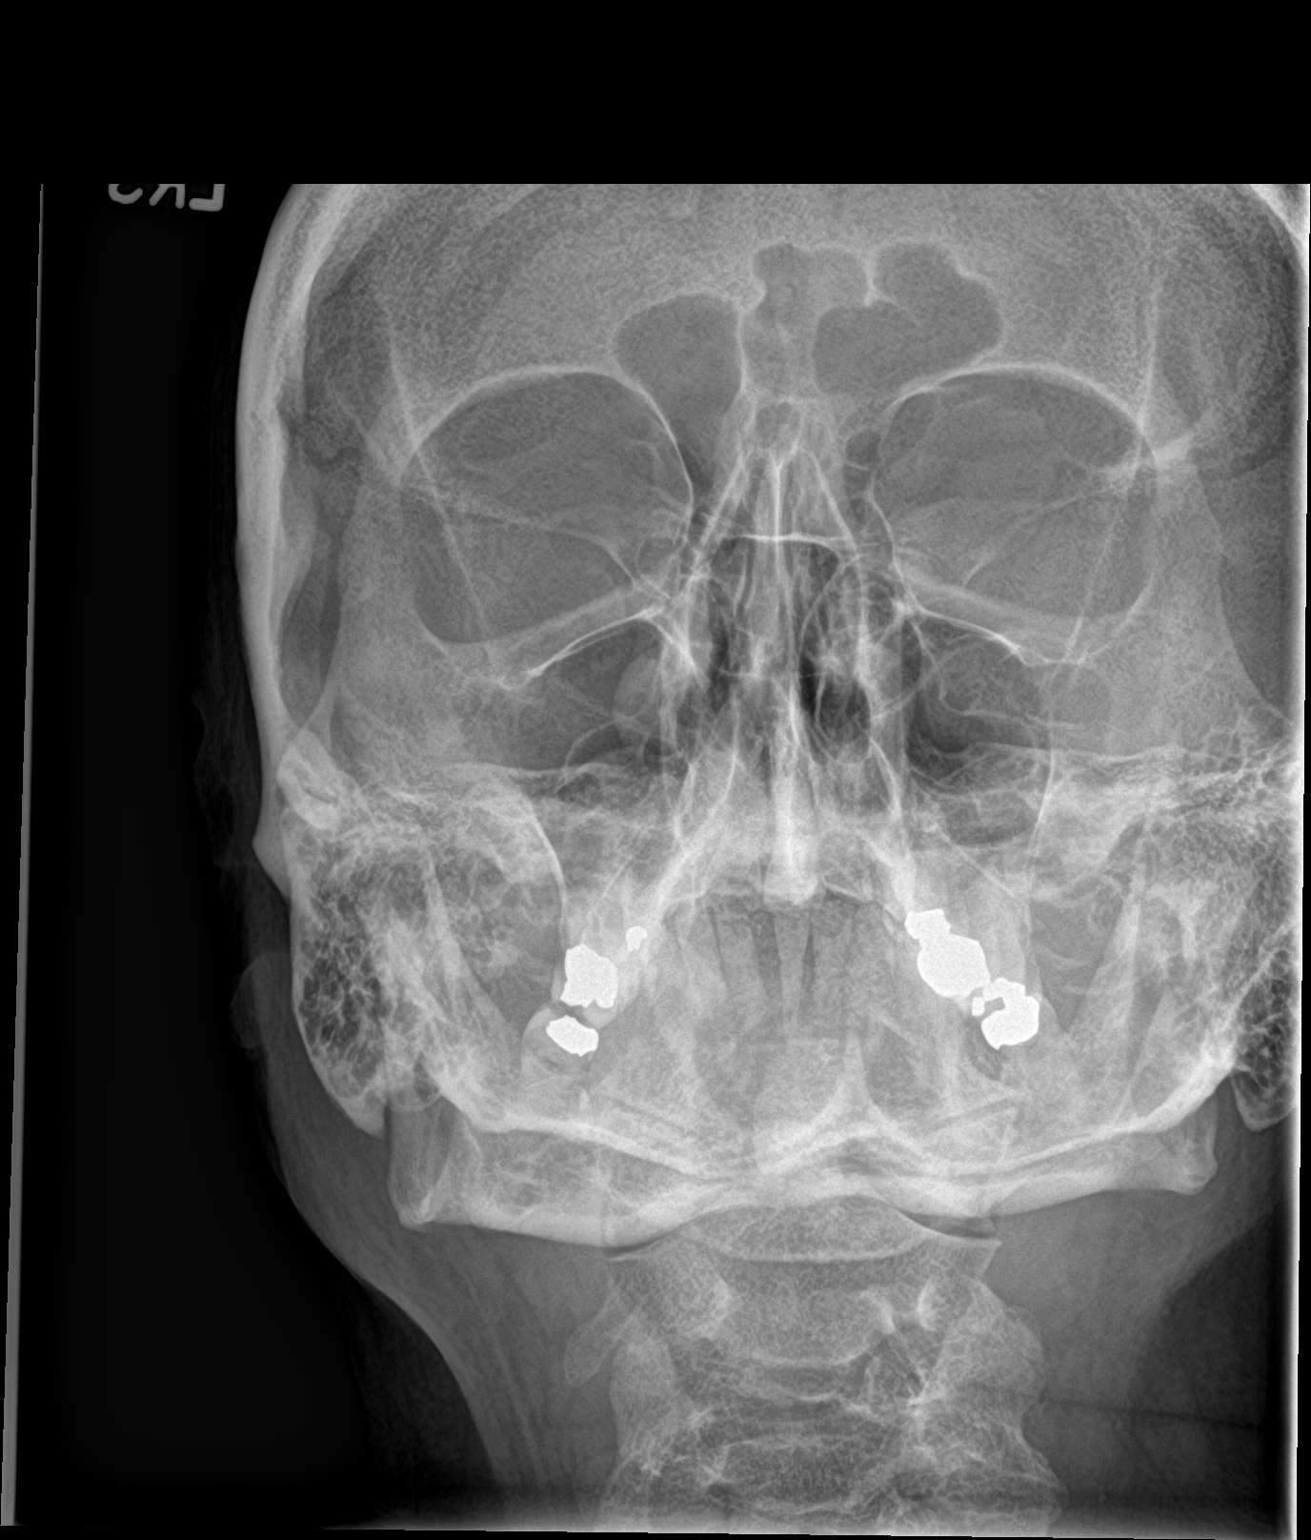

[orbits waters (2 of 2)]
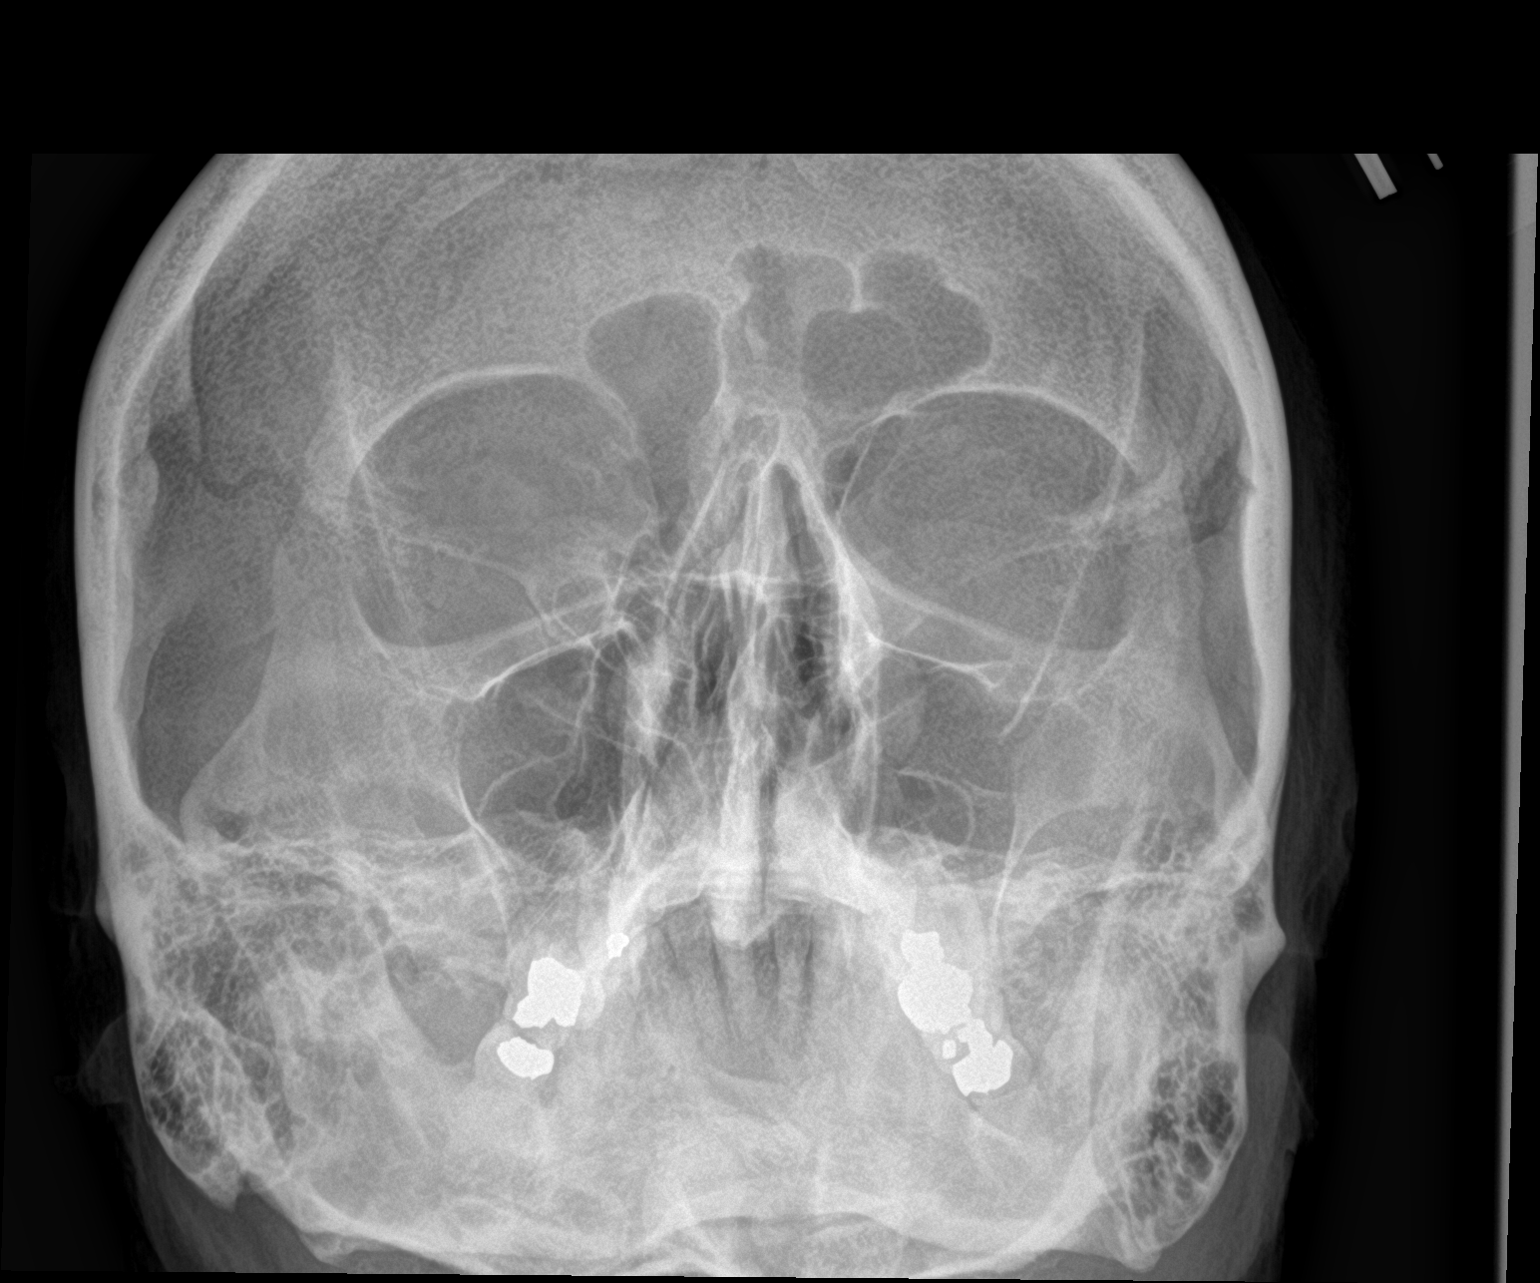

[2 of 2 positions shown; findings below may reference images not displayed]

FINDINGS: There is no evidence of metallic foreign body within the orbits. No
significant bone abnormality identified. Dental fillings noted.
IMPRESSION: No evidence of metallic foreign body within the orbits.

## 2019-11-27 ENCOUNTER — Other Ambulatory Visit: Payer: Self-pay | Admitting: Family Medicine

## 2019-11-27 MED FILL — LEVOTHYROXINE SODIUM 125 MC: 125 | 30 days supply | Qty: 30 | Fill #0

## 2019-11-27 NOTE — Telephone Encounter (Signed)
Please advise, doesn't look like lab was rechecked, seeing another provider later this month.

## 2019-12-11 ENCOUNTER — Other Ambulatory Visit: Payer: Self-pay

## 2019-12-11 ENCOUNTER — Ambulatory Visit: Payer: 59 | Admitting: Family Medicine

## 2019-12-11 ENCOUNTER — Other Ambulatory Visit: Payer: Self-pay | Admitting: Family Medicine

## 2019-12-11 ENCOUNTER — Encounter: Payer: Self-pay | Admitting: Family Medicine

## 2019-12-11 ENCOUNTER — Ambulatory Visit (INDEPENDENT_AMBULATORY_CARE_PROVIDER_SITE_OTHER): Payer: 59 | Admitting: Family Medicine

## 2019-12-11 VITALS — BP 136/76 | HR 82 | Temp 97.2°F | Resp 15 | Ht 72.0 in | Wt 215.0 lb

## 2019-12-11 DIAGNOSIS — G250 Essential tremor: Secondary | ICD-10-CM

## 2019-12-11 DIAGNOSIS — F3341 Major depressive disorder, recurrent, in partial remission: Secondary | ICD-10-CM | POA: Insufficient documentation

## 2019-12-11 DIAGNOSIS — E669 Obesity, unspecified: Secondary | ICD-10-CM

## 2019-12-11 DIAGNOSIS — Z1159 Encounter for screening for other viral diseases: Secondary | ICD-10-CM

## 2019-12-11 DIAGNOSIS — Z1211 Encounter for screening for malignant neoplasm of colon: Secondary | ICD-10-CM | POA: Insufficient documentation

## 2019-12-11 DIAGNOSIS — E1169 Type 2 diabetes mellitus with other specified complication: Secondary | ICD-10-CM | POA: Diagnosis not present

## 2019-12-11 DIAGNOSIS — E039 Hypothyroidism, unspecified: Secondary | ICD-10-CM

## 2019-12-11 DIAGNOSIS — I10 Essential (primary) hypertension: Secondary | ICD-10-CM

## 2019-12-11 DIAGNOSIS — M720 Palmar fascial fibromatosis [Dupuytren]: Secondary | ICD-10-CM | POA: Insufficient documentation

## 2019-12-11 DIAGNOSIS — F329 Major depressive disorder, single episode, unspecified: Secondary | ICD-10-CM | POA: Diagnosis not present

## 2019-12-11 MED ORDER — AMLODIPINE BESYLATE 10 MG PO TABS: 10.0000 mg | ORAL_TABLET | Freq: Every day | ORAL | 1 refills | Status: DC

## 2019-12-11 MED ORDER — LEVOTHYROXINE SODIUM 125 MCG PO TABS: 125.0000 ug | ORAL_TABLET | Freq: Every day | ORAL | 1 refills | Status: DC

## 2019-12-11 NOTE — Assessment & Plan Note (Signed)
Will be getting updated level of TSH as Dr. Holly Bodily had increased his dosage of Synthroid.  Denies have any signs or symptoms of hypothyroidism today.

## 2019-12-11 NOTE — Patient Instructions (Addendum)
I appreciate the opportunity to provide you with care for your health and wellness. Today we discussed: establish care  Follow up: 4 months   Labs-fasting after June 2nd, 2021 Referrals today to GI   Great to meet you today!  Slow reduction in Paxil to 10 mg for 4 weeks, if doing well let me know.  If not we will go back up to 20 mg.  Please continue to practice social distancing to keep you, your family, and our community safe.  If you must go out, please wear a mask and practice good handwashing.  It was a pleasure to see you and I look forward to continuing to work together on your health and well-being. Please do not hesitate to call the office if you need care or have questions about your care.  Have a wonderful day and week. With Gratitude, Cherly Beach, DNP, AGNP-BC

## 2019-12-11 NOTE — Assessment & Plan Note (Signed)
Is followed by Dr. Lovena Le with cardiology.  We will continue current medications at this time.  After sitting for some time recheck of blood pressure was in normal range.  We will get updated labs in June.  Kidney function was stable on previous labs.  Encouraged DASH diet and 30 minutes of exercise at least 5 days a week if not more.

## 2019-12-11 NOTE — Assessment & Plan Note (Signed)
Greater than 10 years overdue for colonoscopy.  Ordered today. Denies having any signs of symptoms of bleeding or trouble with bowels.

## 2019-12-11 NOTE — Assessment & Plan Note (Signed)
Has been seen by neurology.  Reports that his tremors not excessively bad at several when drawing circles and trying to write his name that time his starting to get a little bit worse quickly than it was.  Currently would like to refrain from doing any form of medicine and will follow up with neurology if needed.

## 2019-12-11 NOTE — Progress Notes (Signed)
Subjective:  Patient ID: Jim Salazar, male    DOB: 1950-10-14  Age: 69 y.o. MRN: [id number]  CC:  Chief Complaint  Patient presents with  . Establish Care      HPI  HPI  Jim Salazar is a 69 year old male patient previous patient of Dr. Luan Pulling.  Was seen by Dr. Holly Bodily earlier in the spring.  He has a significant history that includes but is not limited to CAD, depression, type 2 diabetes, hypertension, hypothyroidism, depression, obesity, essential tremor.  He reports taking all his medications without any issues or concerns.  Overall he is doing well and just here to establish care.  Health maintenance: Eye exam in May 2021, dentist regularly flosses and brushes, colonoscopy needs to be ordered willing to have that ordered today, last foot exam was in November 2020, has some improvement in his previous heartburn since he has changed his diet as his A1c was elevated in March he saw the nutritionist down at Dr. Liliane Channel office and has been working to improve not eating after 7 PM to help with blood sugars overnight.  He reports that he has been having some issues with his right ring finger some on his left but predominantly the right having some bending towards the palm.  He reports that is not gotten stuck yet but he wanted to mention it in case there is anything that can be done for it.  He denies having any changes in his bowel or bladder habits.  Denies having any blood in his urine or stool.  Reports he only uses the bathroom once or twice a night.  He does not have any trouble making stream.  Or emptying his bladder.  He only dribbles a little bit occasionally.  He reports that he has not had any falls.  He has fairly good memory denies having any confusion does report some forgetfulness.  He denies having any skin issues or signs of his symptoms of infection.  He reports that he is eating well no trouble chewing or swallowing.  Reports that he drinks 4-5 bottles of water regularly.   Limited caffeine intake.  Today patient denies signs and symptoms of COVID 19 infection including fever, chills, cough, shortness of breath, and headache. Past Medical, Surgical, Social History, Allergies, and Medications have been Reviewed.   Past Medical History:  Diagnosis Date  . CAD (coronary artery disease), native coronary artery    mild nonobstructive plaque in LCx and RCA and mildly obstructive minimally calcified plaque in mid LAD on coronary CTA 11/2017  . Depression   . Diabetes mellitus without complication ([place])   . Hypertension   . Hypothyroidism, unspecified   . Left sided numbness 09/17/2017  . Major depressive disorder, single episode, unspecified   . Need for immunization against influenza 06/12/2019  . Need for vaccination against Streptococcus pneumoniae using pneumococcal conjugate vaccine 13 06/12/2019  . Obesity, unspecified   . Right upper quadrant abdominal pain 09/22/2019  . Screening for prostate cancer 06/12/2019  . Syncope 10/17/2017  . Thyroid disease   . Tremor, essential 11/30/2017  . Type 2 diabetes mellitus with hyperglycemia ([facility])     Current Meds  Medication Sig  . Accu-Chek FastClix Lancets MISC   . amLODipine (NORVASC) 10 MG tablet Take 1 tablet (10 mg total) by mouth daily. AT BEDTIME. Please make overdue appt with Dr.Turner before anymore refills.3rd and Final Attempt  . aspirin EC 81 MG tablet Take 81 mg by mouth every evening.  Marland Kitchen  atorvastatin (LIPITOR) 10 MG tablet Take 1 tablet (10 mg total) by mouth daily.  . cetirizine (ZYRTEC) 10 MG tablet Take 10 mg by mouth daily as needed (for seasonal allergies).  Marland Kitchen dexlansoprazole (DEXILANT) 60 MG capsule Take 60 mg by mouth as needed. FOR REFLUX  . JANUMET 50-1000 MG tablet Take 1 tablet by mouth 2 (two) times daily.  Marland Kitchen levothyroxine (SYNTHROID) 125 MCG tablet Take 1 tablet (125 mcg total) by mouth daily.  Marland Kitchen lisinopril (ZESTRIL) 40 MG tablet Take 1 tablet (40 mg total) by mouth daily.  Marland Kitchen PARoxetine  (PAXIL) 20 MG tablet Take 20 mg by mouth daily.  . [DISCONTINUED] amLODipine (NORVASC) 10 MG tablet Take 1 tablet (10 mg total) by mouth daily. AT BEDTIME. Please make overdue appt with Dr.Turner before anymore refills.3rd and Final Attempt  . [DISCONTINUED] ibuprofen (ADVIL,MOTRIN) 200 MG tablet Take 400 mg by mouth as needed (for headaches).   . [DISCONTINUED] levothyroxine (SYNTHROID) 125 MCG tablet TAKE 1 TABLET (125 MCG TOTAL) BY MOUTH DAILY.    ROS:  Review of Systems  Constitutional: Negative.   HENT: Negative.   Eyes: Negative.   Respiratory: Negative.   Cardiovascular: Negative.   Gastrointestinal: Negative.   Genitourinary: Negative.   Musculoskeletal: Negative.   Skin: Negative.   Neurological: Negative.   Endo/Heme/Allergies: Negative.   Psychiatric/Behavioral: Negative.   All other systems reviewed and are negative.    Objective:   Today's Vitals: BP 136/76   Pulse 82   Temp (!) 97.2 F (36.2 C) (Temporal)   Resp 15   Ht 6' (1.829 m)   Wt 215 lb (97.5 kg)   SpO2 95%   BMI 29.16 kg/m  Vitals with BMI 12/11/2019 12/11/2019 10/22/2019  Height - 6\' 0"  6\' 0"   Weight - 215 lbs 213 lbs 3 oz  BMI - [id number]  Systolic [id number] -  Diastolic 76 80 -  Pulse - 82 -     Physical Exam Vitals and nursing note reviewed.  Constitutional:      Appearance: Normal appearance. He is well-developed, well-groomed and overweight.  HENT:     Head: Normocephalic and atraumatic.     Right Ear: External ear normal.     Left Ear: External ear normal.     Mouth/Throat:     Comments: Mask in place  Eyes:     General:        Right eye: No discharge.        Left eye: No discharge.     Conjunctiva/sclera: Conjunctivae normal.  Cardiovascular:     Rate and Rhythm: Normal rate and regular rhythm.     Pulses: Normal pulses.     Heart sounds: Normal heart sounds.  Pulmonary:     Effort: Pulmonary effort is normal.     Breath sounds: Normal breath sounds.  Musculoskeletal:          General: Normal range of motion.     Cervical back: Normal range of motion and neck supple.     Comments: Dupuytren's contracture stage I right Contracture 0 to stage I left  Skin:    General: Skin is warm.  Neurological:     General: No focal deficit present.     Mental Status: He is alert and oriented to person, place, and time.  Psychiatric:        Attention and Perception: Attention normal.        Mood and Affect: Mood normal.        Speech:  Speech normal.        Behavior: Behavior normal. Behavior is cooperative.        Thought Content: Thought content normal.        Cognition and Memory: Cognition normal.        Judgment: Judgment normal.     Depression screen [facility] 2/9 12/11/2019 10/22/2019 06/12/2019  Decreased Interest 0 0 0  Down, Depressed, Hopeless 0 0 0  PHQ - 2 Score 0 0 0     Assessment   1. Diabetes mellitus type 2 in obese ([place])   2. Essential hypertension   3. Tremor, essential   4. Hypothyroidism, unspecified type   5. Encounter for hepatitis C screening test for low risk patient   6. Encounter for screening for malignant neoplasm of colon   7. Dupuytren contracture     Tests ordered Orders Placed This Encounter  Procedures  . CBC  . COMPLETE METABOLIC PANEL WITH GFR  . Hemoglobin A1c  . Lipid panel  . TSH  . HEP C AB W/REFL  . Ambulatory referral to Gastroenterology     Plan: Please see assessment and plan per problem list above.   Meds ordered this encounter  Medications  . amLODipine (NORVASC) 10 MG tablet    Sig: Take 1 tablet (10 mg total) by mouth daily. AT BEDTIME. Please make overdue appt with Dr.Turner before anymore refills.3rd and Final Attempt    Dispense:  90 tablet    Refill:  1    Please call our office to schedule an overdue appointment with Dr. Radford Pax before anymore refills. [phone]. Thank you 3rd and Final Attempt  . levothyroxine (SYNTHROID) 125 MCG tablet    Sig: Take 1 tablet (125 mcg total) by mouth daily.     Dispense:  90 tablet    Refill:  1    Patient to follow-up in 04/07/2020  Perlie Mayo, NP

## 2019-12-11 NOTE — Assessment & Plan Note (Signed)
Stage I contracture on right, left not as bad. Reports that he does not think he needs referral yet however he probably will need referral in near future.  I have educated him on the possible need for injections and/or surgery to help relieve this from getting worse.  He reports understanding of treatment and plan of care.

## 2019-12-11 NOTE — Assessment & Plan Note (Signed)
After losing his first wife he went into a depression and had several bad years per him.  He has been on Paxil ever since.  He is willing to do a wean to 10 mg to see how he does that if he does do well we will continue to wean him to being off of this medication.  Denies having any SI or HI today in office.  PHQ 0

## 2019-12-11 NOTE — Assessment & Plan Note (Signed)
[place] task force recommendation 

## 2019-12-11 NOTE — Assessment & Plan Note (Signed)
Jim Salazar is encouraged to check blood sugar daily as directed. Updated labs ordered for June. Continue current medications. Is on statin as well. Educated on importance of maintain a well balanced diabetic friendly diet.  He is reminded the importance of maintaining  good blood sugars,  taking medications as directed, daily foot care, annual eye exams. Additionally educated about keeping good control over blood pressure and cholesterol as well.

## 2019-12-17 ENCOUNTER — Other Ambulatory Visit: Payer: Self-pay

## 2019-12-17 ENCOUNTER — Encounter: Payer: Self-pay | Admitting: Internal Medicine

## 2019-12-17 DIAGNOSIS — Z1211 Encounter for screening for malignant neoplasm of colon: Secondary | ICD-10-CM

## 2020-01-05 MED FILL — LEVOTHYROXINE SODIUM 125 MC: 125 | 90 days supply | Qty: 90 | Fill #0

## 2020-01-05 MED FILL — LISINOPRIL 40 MG TABLET: 40 | 90 days supply | Qty: 90 | Fill #2

## 2020-01-13 ENCOUNTER — Ambulatory Visit
Admission: EM | Admit: 2020-01-13 | Discharge: 2020-01-13 | Disposition: A | Discharge status: Home / Self Care | Payer: 59 | Attending: Emergency Medicine | Admitting: Emergency Medicine

## 2020-01-13 ENCOUNTER — Other Ambulatory Visit: Payer: Self-pay

## 2020-01-13 ENCOUNTER — Other Ambulatory Visit: Payer: Self-pay | Admitting: *Deleted

## 2020-01-13 ENCOUNTER — Encounter: Payer: Self-pay | Admitting: Emergency Medicine

## 2020-01-13 DIAGNOSIS — J069 Acute upper respiratory infection, unspecified: Secondary | ICD-10-CM | POA: Insufficient documentation

## 2020-01-13 DIAGNOSIS — J029 Acute pharyngitis, unspecified: Secondary | ICD-10-CM | POA: Insufficient documentation

## 2020-01-13 LAB — POCT RAPID STREP A (OFFICE): Rapid Strep A Screen: NEGATIVE

## 2020-01-13 MED ORDER — CETIRIZINE HCL 10 MG PO TABS
10.0000 mg | ORAL_TABLET | Freq: Every day | ORAL | 0 refills | Status: DC
Start: 2020-01-13 — End: 2021-11-01

## 2020-01-13 MED ORDER — LIDOCAINE VISCOUS HCL 2 % MT SOLN: 15.0000 mL | OROMUCOSAL | 0 refills | Status: DC | PRN

## 2020-01-13 MED ORDER — FLUTICASONE PROPIONATE 50 MCG/ACT NA SUSP
2.0000 | Freq: Every day | NASAL | 0 refills | Status: DC
Start: 2020-01-13 — End: 2022-08-16

## 2020-01-13 MED ORDER — JANUMET 50-1000 MG PO TABS: 1.0000 | ORAL_TABLET | Freq: Two times a day (BID) | ORAL | 0 refills | Status: DC

## 2020-01-13 MED FILL — JANUMET 50-1,000 MG TABLET: 50-1000 | 30 days supply | Qty: 60 | Fill #0

## 2020-01-13 NOTE — ED Triage Notes (Signed)
Provider triage  

## 2020-01-13 NOTE — Discharge Instructions (Signed)
Strep negative.  Culture sent.  We will follow up with you regarding abnormal result Declines COVID Get plenty of rest and push fluids Use OTC zyrtec for nasal congestion, runny nose, and/or sore throat Use OTC flonase for nasal congestion and runny nose Viscous lidocaine prescribed.  This is an oral solution you can swish, gargle, and/or swallow as needed for symptomatic relief of sore throat.  Do not exceed 8 doses in a 24 hour period.  Do not use prior to eating, as this will numb your entire mouth.    Use OTC medications like ibuprofen or tylenol as needed fever or pain Follow up with PCP as needed Return or go to the ED if you have any new or worsening symptoms such as fever, cough, shortness of breath, chest tightness, chest pain, turning blue, changes in mental status, etc..Marland Kitchen

## 2020-01-13 NOTE — ED Provider Notes (Signed)
[place] [facility]   [id number] 01/13/20 Arrival Time: 0945   CC: Sore throat  SUBJECTIVE: History from: patient.  Jim Salazar is a 69 y.o. male who presents with runny nose, congestion, and sore throat x 1 week.  Reports exposure to strep.  Has tried OTC medication without relief.  Symptoms are made worse with swallowing.  Reports previous symptoms in the past with strep and tonsilitis.   Denies fever, chills, fatigue, cough, SOB, wheezing, chest pain, nausea, changes in bowel or bladder habits.    ROS: As per HPI.  All other pertinent ROS negative.     Past Medical History:  Diagnosis Date  . CAD (coronary artery disease), native coronary artery    mild nonobstructive plaque in LCx and RCA and mildly obstructive minimally calcified plaque in mid LAD on coronary CTA 11/2017  . Depression   . Diabetes mellitus without complication ([place])   . Hypertension   . Hypothyroidism, unspecified   . Left sided numbness 09/17/2017  . Major depressive disorder, single episode, unspecified   . Need for immunization against influenza 06/12/2019  . Need for vaccination against Streptococcus pneumoniae using pneumococcal conjugate vaccine 13 06/12/2019  . Obesity, unspecified   . Right upper quadrant abdominal pain 09/22/2019  . Screening for prostate cancer 06/12/2019  . Syncope 10/17/2017  . Thyroid disease   . Tremor, essential 11/30/2017  . Type 2 diabetes mellitus with hyperglycemia [facility])    Past Surgical History:  Procedure Laterality Date  . APPENDECTOMY    . TONSILLECTOMY     No Known Allergies No current facility-administered medications on file prior to encounter.   Current Outpatient Medications on File Prior to Encounter  Medication Sig Dispense Refill  . Accu-Chek FastClix Lancets MISC     . amLODipine (NORVASC) 10 MG tablet Take 1 tablet (10 mg total) by mouth daily. AT BEDTIME. Please make overdue appt with Dr.Turner before anymore refills.3rd and Final Attempt 90 tablet  1  . aspirin EC 81 MG tablet Take 81 mg by mouth every evening.    Marland Kitchen atorvastatin (LIPITOR) 10 MG tablet Take 1 tablet (10 mg total) by mouth daily. 90 tablet 3  . dexlansoprazole (DEXILANT) 60 MG capsule Take 60 mg by mouth as needed. FOR REFLUX    . JANUMET 50-1000 MG tablet Take 1 tablet by mouth 2 (two) times daily. 180 tablet 0  . levothyroxine (SYNTHROID) 125 MCG tablet Take 1 tablet (125 mcg total) by mouth daily. 90 tablet 1  . lisinopril (ZESTRIL) 40 MG tablet Take 1 tablet (40 mg total) by mouth daily. 90 tablet 3  . PARoxetine (PAXIL) 20 MG tablet Take 20 mg by mouth daily.     Social History   Socioeconomic History  . Marital status: Married    Spouse name: [name]   . Number of children: 2  . Years of education: College  . Highest education level: Not on file  Occupational History  . Occupation: semi retired     Comment: Theme park manager   Tobacco Use  . Smoking status: Never Smoker  . Smokeless tobacco: Never Used  Vaping Use  . Vaping Use: Never used  Substance and Sexual Activity  . Alcohol use: No  . Drug use: No  . Sexual activity: Yes    Birth control/protection: None  Other Topics Concern  . Not on file  Social History Narrative   Lives with 2nd wife Lynelle Smoke -married 10 years      3 dogs: 2 inside and 1  outside: Skimp; Ginger; Eve      Enjoys: model railroad Museum/gallery curator, Chiropodist some      Diet: eats all food groups -raisins    Caffeine use: 1-2 cups decaf coffee daily, diet coke sometimes   Water: 4-5 bottles a day       Wears seat belt   Does not use phone while driving-hands free    Oceanographer at home   Weapons in lock box   Social Determinants of Health   Financial Resource Strain: Low Risk   . Difficulty of Paying Living Expenses: Not hard at all  Food Insecurity: No Food Insecurity  . Worried About Charity fundraiser in the Last Year: Never true  . Ran Out of Food in the Last Year: Never true  Transportation Needs: No Transportation Needs  . Lack of  Transportation (Medical): No  . Lack of Transportation (Non-Medical): No  Physical Activity: Insufficiently Active  . Days of Exercise per Week: 5 days  . Minutes of Exercise per Session: 20 min  Stress: No Stress Concern Present  . Feeling of Stress : Only a little  Social Connections: Moderately Integrated  . Frequency of Communication with Friends and Family: More than three times a week  . Frequency of Social Gatherings with Friends and Family: More than three times a week  . Attends Religious Services: More than 4 times per year  . Active Member of Clubs or Organizations: No  . Attends Archivist Meetings: Never  . Marital Status: Married  Human resources officer Violence: Not At Risk  . Fear of Current or Ex-Partner: No  . Emotionally Abused: No  . Physically Abused: No  . Sexually Abused: No   Family History  Problem Relation Age of Onset  . Hypertension Other   . Diabetes Mellitus II Mother   . Cancer Father        lung    OBJECTIVE:  Vitals:   01/13/20 0950  BP: (!) 159/88  Pulse: 90  Resp: 17  Temp: 98.2 F (36.8 C)  TempSrc: Oral  SpO2: 96%     General appearance: alert; well-appearing, nontoxic; speaking in full sentences and tolerating own secretions HEENT: NCAT; Ears: EACs clear, TMs pearly gray; Eyes: PERRL.  EOM grossly intact. Nose: nares patent without rhinorrhea, Throat: oropharynx clear, tonsils absent, uvula midline  Neck: supple without LAD Lungs: unlabored respirations, symmetrical air entry; cough: absent; no respiratory distress; CTAB Heart: regular rate and rhythm.   Skin: warm and dry Psychological: alert and cooperative; normal mood and affect  LABS:  Results for orders placed or performed during the hospital encounter of 01/13/20 (from the past 24 hour(s))  POCT rapid strep A     Status: None   Collection Time: 01/13/20  9:55 AM  Result Value Ref Range   Rapid Strep A Screen Negative Negative     ASSESSMENT & PLAN:  1. Sore  throat   2. Viral URI     Meds ordered this encounter  Medications  . cetirizine (ZYRTEC) 10 MG tablet    Sig: Take 1 tablet (10 mg total) by mouth daily.    Dispense:  30 tablet    Refill:  0    Order Specific Question:   Supervising Provider    Answer:   Raylene Everts [[id number]]  . fluticasone (FLONASE) 50 MCG/ACT nasal spray    Sig: Place 2 sprays into both nostrils daily.    Dispense:  16 g    Refill:  0    Order Specific Question:   Supervising Provider    Answer:   Raylene Everts [[id number]]  . lidocaine (XYLOCAINE) 2 % solution    Sig: Use as directed 15 mLs in the mouth or throat as needed for mouth pain (Do NOT exceed 8 doses in a 24 hour period).    Dispense:  100 mL    Refill:  0    Order Specific Question:   Supervising Provider    Answer:   Raylene Everts [[id number]]    Strep negative.  Culture sent.  We will follow up with you regarding abnormal result Declines COVID Get plenty of rest and push fluids Use OTC zyrtec for nasal congestion, runny nose, and/or sore throat Use OTC flonase for nasal congestion and runny nose Viscous lidocaine prescribed.  This is an oral solution you can swish, gargle, and/or swallow as needed for symptomatic relief of sore throat.  Do not exceed 8 doses in a 24 hour period.  Do not use prior to eating, as this will numb your entire mouth.    Use OTC medications like ibuprofen or tylenol as needed fever or pain Follow up with PCP as needed Return or go to the ED if you have any new or worsening symptoms such as fever, cough, shortness of breath, chest tightness, chest pain, turning blue, changes in mental status, etc...   Reviewed expectations re: course of current medical issues. Questions answered. Outlined signs and symptoms indicating need for more acute intervention. Patient verbalized understanding. After Visit Summary given.         Stacey Drain Louise, PA-C 01/13/20 [phone]

## 2020-01-15 ENCOUNTER — Telehealth (INDEPENDENT_AMBULATORY_CARE_PROVIDER_SITE_OTHER): Payer: 59 | Admitting: Family Medicine

## 2020-01-15 ENCOUNTER — Encounter: Payer: Self-pay | Admitting: Family Medicine

## 2020-01-15 ENCOUNTER — Other Ambulatory Visit: Payer: Self-pay | Admitting: *Deleted

## 2020-01-15 ENCOUNTER — Other Ambulatory Visit: Payer: Self-pay

## 2020-01-15 VITALS — BP 159/88 | Ht 72.0 in | Wt 215.0 lb

## 2020-01-15 DIAGNOSIS — J988 Other specified respiratory disorders: Secondary | ICD-10-CM | POA: Diagnosis not present

## 2020-01-15 DIAGNOSIS — J060 Acute laryngopharyngitis: Secondary | ICD-10-CM | POA: Diagnosis not present

## 2020-01-15 MED ORDER — AMOXICILLIN 500 MG PO CAPS: 500.0000 mg | ORAL_CAPSULE | Freq: Two times a day (BID) | ORAL | 0 refills | Status: AC

## 2020-01-15 NOTE — Assessment & Plan Note (Signed)
Respiratory tract infection in addition to sore throat and laryngitis.  Will cover for strep.  Symptoms have worsened over the last 2 days even with treatment for viral infection and going on a week and a half with worsening symptoms.

## 2020-01-15 NOTE — Progress Notes (Addendum)
Virtual Visit via Telephone Note   This visit type was conducted due to national recommendations for restrictions regarding the COVID-19 Pandemic (e.g. social distancing) in an effort to limit this patient's exposure and mitigate transmission in our community.  Due to his co-morbid illnesses, this patient is at least at moderate risk for complications without adequate follow up.  This format is felt to be most appropriate for this patient at this time.  The patient did not have access to video technology/had technical difficulties with video requiring transitioning to audio format only (telephone).  All issues noted in this document were discussed and addressed.  No physical exam could be performed with this format.   Evaluation Performed:  Follow-up visit  Date:  01/15/2020   ID:  Jim Salazar, Jim Salazar 1951-05-12, MRN [id number]  Patient Location: Home Provider Location: Office  Location of Patient: Home Location of Provider: Telehealth Consent was obtain for visit to be over via telehealth. I verified that I am speaking with the correct person using two identifiers.  PCP:  Perlie Mayo, NP   Chief Complaint:  Cold/Strep exposure   History of Present Illness:    Jim Salazar is a 69 y.o. male  who presents with runny nose, congestion, and sore throat x 1.5 weeks.  Reports exposure to strep.  Has tried OTC medication without relief.  Symptoms are made worse with swallowing.  Reports previous symptoms in the past with strep and tonsilitis.  Went to UC on 01/13/2020 was tested for strep (neg); treated for viral URI, has been doing the treatments they provided. He reports today symptoms have worsen and he is having trouble with talking, swallowing, throat and jaw are very sore. Denies fever, chills, fatigue, cough, SOB, wheezing, chest pain, nausea, changes in bowel or bladder habits.   The patient does not have symptoms concerning for COVID-19 infection (fever, chills, cough, or new  shortness of breath).   Past Medical, Surgical, Social History, Allergies, and Medications have been Reviewed.  Past Medical History:  Diagnosis Date  . CAD (coronary artery disease), native coronary artery    mild nonobstructive plaque in LCx and RCA and mildly obstructive minimally calcified plaque in mid LAD on coronary CTA 11/2017  . Depression   . Diabetes mellitus without complication ([place])   . Hypertension   . Hypothyroidism, unspecified   . Left sided numbness 09/17/2017  . Major depressive disorder, single episode, unspecified   . Need for immunization against influenza 06/12/2019  . Need for vaccination against Streptococcus pneumoniae using pneumococcal conjugate vaccine 13 06/12/2019  . Obesity, unspecified   . Right upper quadrant abdominal pain 09/22/2019  . Screening for prostate cancer 06/12/2019  . Syncope 10/17/2017  . Thyroid disease   . Tremor, essential 11/30/2017  . Type 2 diabetes mellitus with hyperglycemia [facility])    Past Surgical History:  Procedure Laterality Date  . APPENDECTOMY    . TONSILLECTOMY       Current Meds  Medication Sig  . Accu-Chek FastClix Lancets MISC   . amLODipine (NORVASC) 10 MG tablet Take 1 tablet (10 mg total) by mouth daily. AT BEDTIME. Please make overdue appt with Dr.Turner before anymore refills.3rd and Final Attempt  . aspirin EC 81 MG tablet Take 81 mg by mouth every evening.  Marland Kitchen atorvastatin (LIPITOR) 10 MG tablet Take 1 tablet (10 mg total) by mouth daily.  . cetirizine (ZYRTEC) 10 MG tablet Take 1 tablet (10 mg total) by mouth daily.  Marland Kitchen  dexlansoprazole (DEXILANT) 60 MG capsule Take 60 mg by mouth as needed. FOR REFLUX  . fluticasone (FLONASE) 50 MCG/ACT nasal spray Place 2 sprays into both nostrils daily.  Marland Kitchen FREESTYLE LITE test strip   . JANUMET 50-1000 MG tablet Take 1 tablet by mouth 2 (two) times daily.  Marland Kitchen levothyroxine (SYNTHROID) 125 MCG tablet Take 1 tablet (125 mcg total) by mouth daily.  Marland Kitchen lidocaine (XYLOCAINE) 2 %  solution Use as directed 15 mLs in the mouth or throat as needed for mouth pain (Do NOT exceed 8 doses in a 24 hour period).  Marland Kitchen lisinopril (ZESTRIL) 40 MG tablet Take 1 tablet (40 mg total) by mouth daily.  Marland Kitchen PARoxetine (PAXIL) 20 MG tablet Take 20 mg by mouth daily.     Allergies:   Patient has no known allergies.   ROS:   Please see the history of present illness.    All other systems reviewed and are negative.   Labs/Other Tests and Data Reviewed:    Recent Labs: 09/23/2019: ALT 11; BUN 19; Creat 1.03; Hemoglobin 14.8; Platelets 250; Potassium 3.8; Sodium 137; TSH 7.26   Recent Lipid Panel Lab Results  Component Value Date/Time   CHOL 107 09/23/2019 07:46 AM   TRIG 163 (H) 09/23/2019 07:46 AM   HDL 36 (L) 09/23/2019 07:46 AM   CHOLHDL 3.0 09/23/2019 07:46 AM   LDLCALC 47 09/23/2019 07:46 AM    Wt Readings from Last 3 Encounters:  01/15/20 215 lb (97.5 kg)  12/11/19 215 lb (97.5 kg)  10/22/19 213 lb 3.2 oz (96.7 kg)     Objective:    Vital Signs:  BP (!) 159/88   Ht 6' (1.829 m)   Wt 215 lb (97.5 kg)   BMI 29.16 kg/m    VITAL SIGNS:  reviewed GEN:  alert and oriented RESPIRATORY:  no shortness of breath noted in conversation PSYCH:  normal affect and mood  Noted hoariness and nasal tone in voice  ASSESSMENT & PLAN:    1. Respiratory infection  - amoxicillin (AMOXIL) 500 MG capsule; Take 1 capsule (500 mg total) by mouth 2 (two) times daily for 10 days.  Dispense: 20 capsule; Refill: 0  2. Sore throat and laryngitis  - amoxicillin (AMOXIL) 500 MG capsule; Take 1 capsule (500 mg total) by mouth 2 (two) times daily for 10 days.  Dispense: 20 capsule; Refill: 0  Time:   Today, I have spent 10 minutes with the patient with telehealth technology discussing the above problems.     Medication Adjustments/Labs and Tests Ordered: Current medicines are reviewed at length with the patient today.  Concerns regarding medicines are outlined above.   Tests  Ordered: No orders of the defined types were placed in this encounter.   Medication Changes: No orders of the defined types were placed in this encounter.   Disposition:  Follow up as scheduled Signed, Perlie Mayo, NP  01/15/2020 11:30 AM     [place]

## 2020-01-16 ENCOUNTER — Encounter: Payer: Self-pay | Admitting: Family Medicine

## 2020-01-16 LAB — CULTURE, GROUP A STREP (THRC)

## 2020-01-16 MED ORDER — PROMETHAZINE-DM 6.25-15 MG/5ML PO SYRP: 2.5000 mL | ORAL_SOLUTION | Freq: Four times a day (QID) | ORAL | 0 refills | Status: DC | PRN

## 2020-01-16 NOTE — Addendum Note (Signed)
Addended by: Perlie Mayo on: 01/16/2020 08:55 AM   Modules accepted: Orders

## 2020-01-27 ENCOUNTER — Encounter: Payer: Self-pay | Admitting: Internal Medicine

## 2020-02-16 MED FILL — JANUMET 50-1,000 MG TABLET: 50-1000 | 30 days supply | Qty: 60 | Fill #1

## 2020-02-17 ENCOUNTER — Other Ambulatory Visit: Payer: Self-pay

## 2020-02-17 MED ORDER — PAROXETINE HCL 20 MG PO TABS: 20.0000 mg | ORAL_TABLET | Freq: Every day | ORAL | 0 refills | Status: DC

## 2020-02-17 MED FILL — PARoxetine HCL 20 MG TABS: 20 | 90 days supply | Qty: 90 | Fill #0

## 2020-02-27 ENCOUNTER — Encounter: Payer: 59 | Admitting: Internal Medicine

## 2020-03-10 MED FILL — AMLODIPINE BESYLATE 10 MG T: 10 | 90 days supply | Qty: 90 | Fill #0

## 2020-03-17 ENCOUNTER — Ambulatory Visit: Payer: 59 | Admitting: Nutrition

## 2020-03-23 MED FILL — ATORVASTATIN CALCIUM 10 MG: 10 | 90 days supply | Qty: 90 | Fill #2

## 2020-03-23 MED FILL — JANUMET 50-1,000 MG TABLET: 50-1000 | 30 days supply | Qty: 60 | Fill #2

## 2020-03-23 MED FILL — LEVOTHYROXINE SODIUM 125 MC: 125 | 90 days supply | Qty: 90 | Fill #1

## 2020-04-07 ENCOUNTER — Telehealth (INDEPENDENT_AMBULATORY_CARE_PROVIDER_SITE_OTHER): Payer: 59 | Admitting: Internal Medicine

## 2020-04-07 ENCOUNTER — Encounter: Payer: Self-pay | Admitting: Internal Medicine

## 2020-04-07 VITALS — Ht 72.0 in | Wt 214.0 lb

## 2020-04-07 DIAGNOSIS — E1169 Type 2 diabetes mellitus with other specified complication: Secondary | ICD-10-CM

## 2020-04-07 DIAGNOSIS — E039 Hypothyroidism, unspecified: Secondary | ICD-10-CM

## 2020-04-07 DIAGNOSIS — I10 Essential (primary) hypertension: Secondary | ICD-10-CM

## 2020-04-07 DIAGNOSIS — I2510 Atherosclerotic heart disease of native coronary artery without angina pectoris: Secondary | ICD-10-CM | POA: Diagnosis not present

## 2020-04-07 DIAGNOSIS — E669 Obesity, unspecified: Secondary | ICD-10-CM | POA: Diagnosis not present

## 2020-04-07 NOTE — Assessment & Plan Note (Signed)
Check TSH Continue Levothyroxine for now

## 2020-04-07 NOTE — Assessment & Plan Note (Signed)
Continue Aspirin 81 mg QD Follows up with Cardiology

## 2020-04-07 NOTE — Assessment & Plan Note (Signed)
Follows with Dr. Lovena Le (Cardiology) BP ranges in 130s/70s at home Continue Lisinopril and Amlodipine Will check BMP

## 2020-04-07 NOTE — Progress Notes (Signed)
Virtual Visit via Telephone Note   This visit type was conducted due to national recommendations for restrictions regarding the COVID-19 Pandemic (e.g. social distancing) in an effort to limit this patient's exposure and mitigate transmission in our community.  Due to his co-morbid illnesses, this patient is at least at moderate risk for complications without adequate follow up.  This format is felt to be most appropriate for this patient at this time.  The patient did not have access to video technology/had technical difficulties with video requiring transitioning to audio format only (telephone).  All issues noted in this document were discussed and addressed.  No physical exam could be performed with this format.  Please refer to the patient's chart for his  consent to telehealth for [facility].   Evaluation Performed:  Follow-up visit  Date:  04/07/2020   ID:  Jim Salazar, DOB Dec 11, 1950, MRN [id number]  Patient Location: Home Provider Location: Office/Clinic  Location of Patient: Home Location of Provider: Telehealth Consent was obtain for visit to be over via telehealth. I verified that I am speaking with the correct person using two identifiers.  PCP:  Perlie Mayo, NP   Chief Complaint:  Follow up of HTN, DM, HLD and hypothyroidism  History of Present Illness:    Jim Salazar is a 69 y.o. male with PMH as mentioned below has a telephone visit for follow up for chronic conditions. Patient reports being in good health. Patient checks his BP everyday, ranges in 130s/70s, takes medications regularly and tolerates well. Patient's blood glucose in the morning and at bedtime ranges from 120-160 and takes Janumet for DM. Last HbA1C noted to be 9.7 (09/23/2019). Patient denies active complaints.  The patient does not have symptoms concerning for COVID-19 infection (fever, chills, cough, or new shortness of breath).   Past Medical, Surgical, Social History, Allergies, and  Medications have been Reviewed.  Past Medical History:  Diagnosis Date  . CAD (coronary artery disease), native coronary artery    mild nonobstructive plaque in LCx and RCA and mildly obstructive minimally calcified plaque in mid LAD on coronary CTA 11/2017  . Depression   . Diabetes mellitus without complication ([place])   . Hypertension   . Hypothyroidism, unspecified   . Left sided numbness 09/17/2017  . Major depressive disorder, single episode, unspecified   . Need for immunization against influenza 06/12/2019  . Need for vaccination against Streptococcus pneumoniae using pneumococcal conjugate vaccine 13 06/12/2019  . Obesity, unspecified   . Right upper quadrant abdominal pain 09/22/2019  . Screening for prostate cancer 06/12/2019  . Syncope 10/17/2017  . Thyroid disease   . Tremor, essential 11/30/2017  . Type 2 diabetes mellitus with hyperglycemia [facility])    Past Surgical History:  Procedure Laterality Date  . APPENDECTOMY    . TONSILLECTOMY       Current Meds  Medication Sig  . Accu-Chek FastClix Lancets MISC   . amLODipine (NORVASC) 10 MG tablet Take 1 tablet (10 mg total) by mouth daily. AT BEDTIME. Please make overdue appt with Dr.Turner before anymore refills.3rd and Final Attempt  . aspirin EC 81 MG tablet Take 81 mg by mouth every evening.  Marland Kitchen atorvastatin (LIPITOR) 10 MG tablet Take 1 tablet (10 mg total) by mouth daily.  . cetirizine (ZYRTEC) 10 MG tablet Take 1 tablet (10 mg total) by mouth daily.  Marland Kitchen dexlansoprazole (DEXILANT) 60 MG capsule Take 60 mg by mouth as needed. FOR REFLUX  . fluticasone (FLONASE)  50 MCG/ACT nasal spray Place 2 sprays into both nostrils daily.  Marland Kitchen FREESTYLE LITE test strip   . JANUMET 50-1000 MG tablet Take 1 tablet by mouth 2 (two) times daily.  Marland Kitchen levothyroxine (SYNTHROID) 125 MCG tablet Take 1 tablet (125 mcg total) by mouth daily.  Marland Kitchen lisinopril (ZESTRIL) 40 MG tablet Take 1 tablet (40 mg total) by mouth daily.  Marland Kitchen PARoxetine (PAXIL) 20 MG  tablet Take 1 tablet (20 mg total) by mouth daily.     Allergies:   Patient has no known allergies.   ROS:   Please see the history of present illness.    Review of Systems  Constitutional: Negative for chills and fever.  HENT: Negative for congestion, sinus pain and sore throat.   Eyes: Negative for blurred vision, double vision and photophobia.  Respiratory: Negative for cough and shortness of breath.   Cardiovascular: Negative for chest pain and palpitations.  Gastrointestinal: Negative for nausea and vomiting.  Genitourinary: Negative for dysuria and hematuria.  Musculoskeletal: Negative for falls.  Neurological: Negative for dizziness and weakness.  Endo/Heme/Allergies: Negative for polydipsia.    Labs/Other Tests and Data Reviewed:    Recent Labs: 09/23/2019: ALT 11; BUN 19; Creat 1.03; Hemoglobin 14.8; Platelets 250; Potassium 3.8; Sodium 137; TSH 7.26   Recent Lipid Panel Lab Results  Component Value Date/Time   CHOL 107 09/23/2019 07:46 AM   TRIG 163 (H) 09/23/2019 07:46 AM   HDL 36 (L) 09/23/2019 07:46 AM   CHOLHDL 3.0 09/23/2019 07:46 AM   LDLCALC 47 09/23/2019 07:46 AM    Wt Readings from Last 3 Encounters:  04/07/20 214 lb (97.1 kg)  01/15/20 215 lb (97.5 kg)  12/11/19 215 lb (97.5 kg)     Objective:    Vital Signs:  Ht 6' (1.829 m)   Wt 214 lb (97.1 kg)   BMI 29.02 kg/m     ASSESSMENT & PLAN:   Essential hypertension Follows with Dr. Lovena Le (Cardiology) BP ranges in 130s/70s at home Continue Lisinopril and Amlodipine Will check BMP  CAD (coronary artery disease), native coronary artery Continue Aspirin 81 mg QD Follows up with Cardiology  Hypothyroidism, unspecified Check TSH Continue Levothyroxine for now  Diabetes mellitus type 2 in obese (HCC) Blood sugar in AM and at bedtime: 120-160 On Janumet Obtain BMP and HbA1C Will adjust medications accordingly Advised to adhere to diabetic diet    Time:   Today, I have spent 15 minutes  with the patient with telehealth technology discussing the above problems.     Medication Adjustments/Labs and Tests Ordered: Current medicines are reviewed at length with the patient today.  Concerns regarding medicines are outlined above.   Tests Ordered: Orders Placed This Encounter  Procedures  . CBC  . COMPLETE METABOLIC PANEL WITH GFR  . HgB A1c  . Lipid Profile  . T4 AND TSH    Medication Changes: No orders of the defined types were placed in this encounter.   Disposition:  Follow up Signed, Lindell Spar, MD  04/07/2020 9:20 AM     [place]

## 2020-04-07 NOTE — Assessment & Plan Note (Signed)
Blood sugar in AM and at bedtime: 120-160 On Janumet Obtain BMP and HbA1C Will adjust medications accordingly Advised to adhere to diabetic diet

## 2020-04-07 NOTE — Patient Instructions (Signed)
Advise to get blood tests done within a week and schedule an appointment in 2 weeks.  Continue current medications for now.  Continue DASH diet and small-frequent meals. Continue to check BP and blood sugars regularly.

## 2020-04-09 ENCOUNTER — Encounter: Payer: 59 | Admitting: Internal Medicine

## 2020-04-09 ENCOUNTER — Encounter: Payer: Self-pay | Admitting: Family Medicine

## 2020-04-09 MED FILL — LISINOPRIL 40 MG TABS: 40 | 90 days supply | Qty: 90 | Fill #3

## 2020-04-14 NOTE — Progress Notes (Signed)
Pt mother had a stroke and hes been running he will call them and reschedule

## 2020-04-21 ENCOUNTER — Other Ambulatory Visit: Payer: Self-pay | Admitting: Family Medicine

## 2020-04-21 ENCOUNTER — Ambulatory Visit: Payer: 59 | Admitting: Nutrition

## 2020-04-26 ENCOUNTER — Other Ambulatory Visit: Payer: Self-pay | Admitting: *Deleted

## 2020-04-26 MED ORDER — JANUMET 50-1000 MG PO TABS: 1.0000 | ORAL_TABLET | Freq: Two times a day (BID) | ORAL | 0 refills | Status: DC

## 2020-04-26 MED FILL — JANUMET 50-1,000 MG TABLET: 50-1000 | 30 days supply | Qty: 60 | Fill #0

## 2020-04-29 ENCOUNTER — Ambulatory Visit (INDEPENDENT_AMBULATORY_CARE_PROVIDER_SITE_OTHER): Payer: 59 | Admitting: Family Medicine

## 2020-04-29 ENCOUNTER — Encounter: Payer: Self-pay | Admitting: Family Medicine

## 2020-04-29 ENCOUNTER — Other Ambulatory Visit: Payer: Self-pay

## 2020-04-29 VITALS — BP 149/93 | HR 81 | Temp 97.2°F | Resp 18 | Ht 72.0 in | Wt 209.8 lb

## 2020-04-29 DIAGNOSIS — Z23 Encounter for immunization: Secondary | ICD-10-CM

## 2020-04-29 DIAGNOSIS — I10 Essential (primary) hypertension: Secondary | ICD-10-CM

## 2020-04-29 MED ORDER — CHLORTHALIDONE 25 MG PO TABS: 25.0000 mg | ORAL_TABLET | Freq: Every day | ORAL | 2 refills | Status: DC

## 2020-04-29 NOTE — Assessment & Plan Note (Signed)

## 2020-04-29 NOTE — Progress Notes (Signed)
Subjective:  Patient ID: Jim Salazar, male    DOB: May 15, 1951  Age: 70 y.o. MRN: [id number]  CC:  Chief Complaint  Patient presents with   Follow-up    2 week follow up blood work and bp did not get blood work needs to wait until after10/17 for insurance purposes       HPI  HPI  Jim Salazar is a 69 year old male patient of mine who presents today for follow-up on blood pressure. History as stated below. Does have coronary artery disease of note and high blood pressure. He reports that his mother recently had a stroke at the end of July as well. He does not always follow the best diet per him. He does not have any chest pain, headaches or dizziness or vision changes. Blood pressure is better today in the office than it was back in June. We had discussed possible need for medication adjustment which she is agreeable to. He reports taking his Norvasc and lisinopril as directed. He reports that he used to be on a combination pill of lisinopril but he went to the hospital a few years back and it took him off the diuretic and put him on the Norvasc.  He is open to getting the flu vaccine today.  Today patient denies signs and symptoms of COVID 19 infection including fever, chills, cough, shortness of breath, and headache. Past Medical, Surgical, Social History, Allergies, and Medications have been Reviewed.   Past Medical History:  Diagnosis Date   CAD (coronary artery disease), native coronary artery    mild nonobstructive plaque in LCx and RCA and mildly obstructive minimally calcified plaque in mid LAD on coronary CTA 11/2017   Depression    Diabetes mellitus without complication ([place])    Hypertension    Hypothyroidism, unspecified    Left sided numbness 09/17/2017   Major depressive disorder, single episode, unspecified    Need for immunization against influenza 06/12/2019   Need for vaccination against Streptococcus pneumoniae using pneumococcal conjugate vaccine 13  06/12/2019   Obesity, unspecified    Right upper quadrant abdominal pain 09/22/2019   Screening for prostate cancer 06/12/2019   Syncope 10/17/2017   Thyroid disease    Tremor, essential 11/30/2017   Type 2 diabetes mellitus with hyperglycemia (HCC)     Current Meds  Medication Sig   Accu-Chek FastClix Lancets MISC    amLODipine (NORVASC) 10 MG tablet Take 1 tablet (10 mg total) by mouth daily. AT BEDTIME. Please make overdue appt with Dr.Turner before anymore refills.3rd and Final Attempt   aspirin EC 81 MG tablet Take 81 mg by mouth every evening.   atorvastatin (LIPITOR) 10 MG tablet Take 1 tablet (10 mg total) by mouth daily.   cetirizine (ZYRTEC) 10 MG tablet Take 1 tablet (10 mg total) by mouth daily.   dexlansoprazole (DEXILANT) 60 MG capsule Take 60 mg by mouth as needed. FOR REFLUX   fluticasone (FLONASE) 50 MCG/ACT nasal spray Place 2 sprays into both nostrils daily.   FREESTYLE LITE test strip    JANUMET 50-1000 MG tablet Take 1 tablet by mouth 2 (two) times daily.   levothyroxine (SYNTHROID) 125 MCG tablet Take 1 tablet (125 mcg total) by mouth daily.   lisinopril (ZESTRIL) 40 MG tablet Take 1 tablet (40 mg total) by mouth daily.   PARoxetine (PAXIL) 20 MG tablet Take 1 tablet (20 mg total) by mouth daily.    ROS:  Review of Systems  Constitutional: Negative.  HENT: Negative.   Eyes: Negative.   Respiratory: Negative.   Cardiovascular: Negative.   Gastrointestinal: Negative.   Genitourinary: Negative.   Musculoskeletal: Negative.   Skin: Negative.   Neurological: Negative.   Endo/Heme/Allergies: Negative.   Psychiatric/Behavioral: Negative.      Objective:   Today's Vitals: BP (!) 149/93 (BP Location: Right Arm, Patient Position: Sitting, Cuff Size: Normal)    Pulse 81    Temp (!) 97.2 F (36.2 C) (Temporal)    Resp 18    Ht 6' (1.829 m)    Wt 209 lb 12.8 oz (95.2 kg)    SpO2 98%    BMI 28.45 kg/m  Vitals with BMI 04/29/2020 04/07/2020  01/15/2020  Height 6\' 0"  6\' 0"  6\' 0"   Weight 209 lbs 13 oz 214 lbs 215 lbs  BMI 28.45 74.25 95.63  Systolic 875 - 643  Diastolic 93 - 88  Pulse 81 - -     Physical Exam Vitals and nursing note reviewed.  Constitutional:      Appearance: Normal appearance. He is well-developed, well-groomed and overweight.  HENT:     Head: Normocephalic and atraumatic.     Right Ear: External ear normal.     Left Ear: External ear normal.     Mouth/Throat:     Comments: Mask in place  Eyes:     General:        Right eye: No discharge.        Left eye: No discharge.     Conjunctiva/sclera: Conjunctivae normal.  Cardiovascular:     Rate and Rhythm: Normal rate and regular rhythm.     Pulses: Normal pulses.     Heart sounds: Normal heart sounds.  Pulmonary:     Effort: Pulmonary effort is normal.     Breath sounds: Normal breath sounds.  Musculoskeletal:        General: Normal range of motion.     Cervical back: Normal range of motion and neck supple.  Skin:    General: Skin is warm.  Neurological:     General: No focal deficit present.     Mental Status: He is alert and oriented to person, place, and time.  Psychiatric:        Attention and Perception: Attention normal.        Mood and Affect: Mood normal.        Speech: Speech normal.        Behavior: Behavior normal. Behavior is cooperative.        Thought Content: Thought content normal.        Cognition and Memory: Cognition normal.        Judgment: Judgment normal.     Assessment   1. Need for immunization against influenza   2. Essential hypertension     Tests ordered Orders Placed This Encounter  Procedures   Flu Vaccine QUAD High Dose(Fluad)     Plan: Please see assessment and plan per problem list above.   Meds ordered this encounter  Medications   chlorthalidone (HYGROTON) 25 MG tablet    Sig: Take 1 tablet (25 mg total) by mouth daily.    Dispense:  30 tablet    Refill:  2    Order Specific Question:    Supervising Provider    Answer:   Fayrene Helper [[id number]]    Patient to follow-up in 8 weeks   Perlie Mayo, NP

## 2020-04-29 NOTE — Assessment & Plan Note (Signed)
BP is still elevated and now with mothers recent change in stroke hx. I think having an adjustment to his medications would be best. He is to continue is lisinopril and amlodipine with the addition of chlorthalidone. Will be getting updated labs here shortly. If any adjustment secondary to kidney function needs to be made we will drop the chlorthalidone and look at possible other alternatives/agents to help with better blood pressure control. He had previously reported his blood pressures in the 130s over the 70s at home. But at times he reports that it has been more elevated. He is in agreements with a care plan change.

## 2020-04-29 NOTE — Patient Instructions (Signed)
  HAPPY FALL!  I appreciate the opportunity to provide you with care for your health and wellness. Today we discussed: BP and med add  Follow up: 8 weeks for BP   No labs or referrals today  We will see how labs are in a week. If you have trouble with new medication please call.  Hope your mom makes a full recovery and can enjoy her flowers come next season.   Please continue to practice social distancing to keep you, your family, and our community safe.  If you must go out, please wear a mask and practice good handwashing.  It was a pleasure to see you and I look forward to continuing to work together on your health and well-being. Please do not hesitate to call the office if you need care or have questions about your care.  Have a wonderful day and week. With Gratitude, Cherly Beach, DNP, AGNP-BC

## 2020-06-01 ENCOUNTER — Other Ambulatory Visit: Payer: Self-pay | Admitting: Family Medicine

## 2020-06-01 MED FILL — JANUMET 50-1,000 MG TABLET: 50-1000 | 30 days supply | Qty: 60 | Fill #1

## 2020-06-08 ENCOUNTER — Encounter: Payer: Self-pay | Admitting: Family Medicine

## 2020-06-09 ENCOUNTER — Other Ambulatory Visit: Payer: Self-pay | Admitting: *Deleted

## 2020-06-09 MED ORDER — PAROXETINE HCL 20 MG PO TABS: 20.0000 mg | ORAL_TABLET | Freq: Every day | ORAL | 0 refills | Status: DC

## 2020-06-12 MED FILL — PARoxetine HCL 20 MG TABS: 20 | 90 days supply | Qty: 90 | Fill #0

## 2020-06-22 DIAGNOSIS — E669 Obesity, unspecified: Secondary | ICD-10-CM | POA: Diagnosis not present

## 2020-06-22 DIAGNOSIS — E1169 Type 2 diabetes mellitus with other specified complication: Secondary | ICD-10-CM | POA: Diagnosis not present

## 2020-06-23 ENCOUNTER — Other Ambulatory Visit: Payer: Self-pay | Admitting: Physician Assistant

## 2020-06-23 LAB — COMPLETE METABOLIC PANEL WITH GFR
AG Ratio: 2 (calc) (ref 1.0–2.5)
ALT: 12 U/L (ref 9–46)
AST: 14 U/L (ref 10–35)
Albumin: 4.3 g/dL (ref 3.6–5.1)
Alkaline phosphatase (APISO): 78 U/L (ref 35–144)
BUN: 19 mg/dL (ref 7–25)
CO2: 30 mmol/L (ref 20–32)
Calcium: 9.2 mg/dL (ref 8.6–10.3)
Chloride: 98 mmol/L (ref 98–110)
Creat: 1.02 mg/dL (ref 0.70–1.25)
GFR, Est African American: 88 mL/min/{1.73_m2} (ref >=60)
GFR, Est Non African American: 76 mL/min/{1.73_m2} (ref >=60)
Globulin: 2.1 g/dL (calc) (ref 1.9–3.7)
Glucose, Bld: 303 mg/dL — ABNORMAL HIGH (ref 65–99)
Potassium: 4.3 mmol/L (ref 3.5–5.3)
Sodium: 135 mmol/L (ref 135–146)
Total Bilirubin: 1.3 mg/dL — ABNORMAL HIGH (ref 0.2–1.2)
Total Protein: 6.4 g/dL (ref 6.1–8.1)

## 2020-06-23 LAB — HEMOGLOBIN A1C
Hgb A1c MFr Bld: 10.1 % of total Hgb — ABNORMAL HIGH (ref <5.7)
Mean Plasma Glucose: 243 (calc)
eAG (mmol/L): 13.5 (calc)

## 2020-06-23 MED FILL — ATORVASTATIN CALCIUM 10 MG: 10 | 90 days supply | Qty: 90 | Fill #0

## 2020-06-23 MED FILL — AMLODIPINE BESYLATE 10 MG T: 10 | 90 days supply | Qty: 90 | Fill #1

## 2020-06-23 NOTE — Telephone Encounter (Signed)
Pt requesting refill on Atorvastatin. He has been f/u with his PCP and not having any new symptoms or concerns. I scheduled him a VV on 12/7.

## 2020-06-24 ENCOUNTER — Ambulatory Visit (INDEPENDENT_AMBULATORY_CARE_PROVIDER_SITE_OTHER): Payer: 59 | Admitting: Family Medicine

## 2020-06-24 ENCOUNTER — Other Ambulatory Visit: Payer: Self-pay | Admitting: Family Medicine

## 2020-06-24 ENCOUNTER — Encounter: Payer: Self-pay | Admitting: Family Medicine

## 2020-06-24 ENCOUNTER — Other Ambulatory Visit: Payer: Self-pay

## 2020-06-24 VITALS — BP 128/78 | HR 80 | Temp 97.5°F | Ht 72.0 in | Wt 213.0 lb

## 2020-06-24 DIAGNOSIS — E1169 Type 2 diabetes mellitus with other specified complication: Secondary | ICD-10-CM

## 2020-06-24 DIAGNOSIS — Z6828 Body mass index (BMI) 28.0-28.9, adult: Secondary | ICD-10-CM | POA: Insufficient documentation

## 2020-06-24 DIAGNOSIS — I10 Essential (primary) hypertension: Secondary | ICD-10-CM

## 2020-06-24 DIAGNOSIS — E663 Overweight: Secondary | ICD-10-CM | POA: Diagnosis not present

## 2020-06-24 DIAGNOSIS — E669 Obesity, unspecified: Secondary | ICD-10-CM | POA: Diagnosis not present

## 2020-06-24 MED ORDER — METFORMIN HCL 1000 MG PO TABS: 1000.0000 mg | ORAL_TABLET | Freq: Two times a day (BID) | ORAL | 1 refills | Status: DC

## 2020-06-24 MED ORDER — TRULICITY 0.75 MG/0.5ML ~~LOC~~ SOAJ: 0.7500 mg | SUBCUTANEOUS | 3 refills | Status: DC

## 2020-06-24 MED FILL — METFORMIN HCL 1000 MG TABS: 1000 | 90 days supply | Qty: 180 | Fill #0

## 2020-06-24 MED FILL — TRULICITY 0.75 MG/0.5 ML PE: 0.75 | 84 days supply | Qty: 6 | Fill #0

## 2020-06-24 NOTE — Assessment & Plan Note (Signed)
Deteriorated.   Jim Salazar is re-educated about the importance of exercise daily to help with weight management. A minumum of 30 minutes daily is recommended. Additionally, importance of healthy food choices  with portion control discussed.   Wt Readings from Last 3 Encounters:  06/24/20 213 lb (96.6 kg)  04/29/20 209 lb 12.8 oz (95.2 kg)  04/07/20 214 lb (97.1 kg)

## 2020-06-24 NOTE — Assessment & Plan Note (Addendum)
A1c elevated to 84.7% Started on Trulicity 2.07 mg Changed Jaumet back to Metformin to prevent hypoglycemia- f/u 3 months  MENA LIENAU is encouraged to check blood sugar daily as directed.  Is on statin as well. Educated on importance of maintain a well balanced diabetic friendly diet.  He is reminded the importance of maintaining  good blood sugars,  taking medications as directed, daily foot care, annual eye exams. Additionally educated about keeping good control over blood pressure and cholesterol as well.

## 2020-06-24 NOTE — Assessment & Plan Note (Signed)
Jim Salazar is encouraged to maintain a well balanced diet that is low in salt. Controlled, continue current medication regimen.  Additionally, he is also reminded that exercise is beneficial for heart health and control of  Blood pressure. 30-60 minutes daily is recommended-walking was suggested.

## 2020-06-24 NOTE — Patient Instructions (Addendum)
  I appreciate the opportunity to provide you with care for your health and wellness. Today we discussed: BP   Follow up: April/May for CPE - it is ok to be with another provider  No labs or referrals today  SO happy your BP is better controlled!  I hope you have a Merry Christmas and a Happy New Year!  Please continue to practice social distancing to keep you, your family, and our community safe.  If you must go out, please wear a mask and practice good handwashing.  It was a pleasure to see you and I look forward to continuing to work together on your health and well-being. Please do not hesitate to call the office if you need care or have questions about your care.  Have a wonderful day. With Gratitude, Cherly Beach, DNP, AGNP-BC

## 2020-06-24 NOTE — Progress Notes (Signed)
Subjective:  Patient ID: Jim Salazar, male    DOB: 1950/11/21  Age: 69 y.o. MRN: [id number]  CC:  Chief Complaint  Patient presents with  . Follow-up      HPI  [name] is a 69 year old male patient of mine who presents today for follow-up on blood pressure. History as stated below. BP is much improved today. Reports control at home with 130/70's. Kidney function is stable. Will continue all medications as ordered. Denies having chest pain, cough, shortness of breath, leg swelling, headaches or dizziness.   Today patient denies signs and symptoms of COVID 19 infection including fever, chills, cough, shortness of breath, and headache. Past Medical, Surgical, Social History, Allergies, and Medications have been Reviewed.   Past Medical History:  Diagnosis Date  . CAD (coronary artery disease), native coronary artery    mild nonobstructive plaque in LCx and RCA and mildly obstructive minimally calcified plaque in mid LAD on coronary CTA 11/2017  . Depression   . Diabetes mellitus without complication ([place])   . Hypertension   . Hypothyroidism, unspecified   . Left sided numbness 09/17/2017  . Major depressive disorder, single episode, unspecified   . Need for immunization against influenza 06/12/2019  . Need for vaccination against Streptococcus pneumoniae using pneumococcal conjugate vaccine 13 06/12/2019  . Obesity, unspecified   . Right upper quadrant abdominal pain 09/22/2019  . Screening for prostate cancer 06/12/2019  . Syncope 10/17/2017  . Thyroid disease   . Tremor, essential 11/30/2017  . Type 2 diabetes mellitus with hyperglycemia ([facility])     Current Meds  Medication Sig  . Accu-Chek FastClix Lancets MISC   . amLODipine (NORVASC) 10 MG tablet Take 1 tablet (10 mg total) by mouth daily. AT BEDTIME. Please make overdue appt with Dr.Turner before anymore refills.3rd and Final Attempt  . aspirin EC 81 MG tablet Take 81 mg by mouth every evening.  Marland Kitchen atorvastatin  (LIPITOR) 10 MG tablet TAKE 1 TABLET (10 MG TOTAL) BY MOUTH DAILY.  . cetirizine (ZYRTEC) 10 MG tablet Take 1 tablet (10 mg total) by mouth daily.  . chlorthalidone (HYGROTON) 25 MG tablet Take 1 tablet (25 mg total) by mouth daily.  Marland Kitchen dexlansoprazole (DEXILANT) 60 MG capsule Take 60 mg by mouth as needed. FOR REFLUX  . fluticasone (FLONASE) 50 MCG/ACT nasal spray Place 2 sprays into both nostrils daily.  Marland Kitchen FREESTYLE LITE test strip   . levothyroxine (SYNTHROID) 125 MCG tablet Take 1 tablet (125 mcg total) by mouth daily.  Marland Kitchen lisinopril (ZESTRIL) 40 MG tablet Take 1 tablet (40 mg total) by mouth daily.  Marland Kitchen PARoxetine (PAXIL) 20 MG tablet Take 1 tablet (20 mg total) by mouth daily.  . [DISCONTINUED] JANUMET 50-1000 MG tablet Take 1 tablet by mouth 2 (two) times daily.    ROS:  Review of Systems  Constitutional: Negative.   HENT: Negative.   Eyes: Negative.   Respiratory: Negative.   Cardiovascular: Negative.   Gastrointestinal: Negative.   Genitourinary: Negative.   Musculoskeletal: Negative.   Skin: Negative.   Neurological: Negative.   Endo/Heme/Allergies: Negative.   Psychiatric/Behavioral: Negative.      Objective:   Today's Vitals: BP 128/78 (BP Location: Right Arm, Patient Position: Sitting, Cuff Size: Normal)   Pulse 80   Temp (!) 97.5 F (36.4 C) (Temporal)   Ht 6' (1.829 m)   Wt 213 lb (96.6 kg)   SpO2 97%   BMI 28.89 kg/m  Vitals with BMI 06/24/2020 04/29/2020 04/07/2020  Height 6\' 0"  6\' 0"  6\' 0"   Weight 213 lbs 209 lbs 13 oz 214 lbs  BMI 28.88 28.31 51.76  Systolic 160 737 -  Diastolic 78 93 -  Pulse 80 81 -     Physical Exam Vitals and nursing note reviewed.  Constitutional:      Appearance: Normal appearance. He is well-developed, well-groomed and overweight.  HENT:     Head: Normocephalic and atraumatic.     Right Ear: External ear normal.     Left Ear: External ear normal.     Mouth/Throat:     Comments: Mask in place Eyes:     General:         Right eye: No discharge.        Left eye: No discharge.     Conjunctiva/sclera: Conjunctivae normal.  Cardiovascular:     Rate and Rhythm: Normal rate and regular rhythm.     Pulses: Normal pulses.     Heart sounds: Normal heart sounds.  Pulmonary:     Effort: Pulmonary effort is normal.     Breath sounds: Normal breath sounds.  Musculoskeletal:        General: Normal range of motion.     Cervical back: Normal range of motion and neck supple.  Skin:    General: Skin is warm.  Neurological:     General: No focal deficit present.     Mental Status: He is alert and oriented to person, place, and time.  Psychiatric:        Attention and Perception: Attention normal.        Mood and Affect: Mood normal.        Speech: Speech normal.        Behavior: Behavior normal. Behavior is cooperative.        Thought Content: Thought content normal.        Cognition and Memory: Cognition normal.        Judgment: Judgment normal.     Assessment   1. Diabetes mellitus type 2 in obese ([place])   2. Essential hypertension   3. Overweight with body mass index (BMI) of 28 to 28.9 in adult     Tests ordered No orders of the defined types were placed in this encounter.    Plan: Please see assessment and plan per problem list above.   Meds ordered this encounter  Medications  . Dulaglutide (TRULICITY) 1.06 YI/9.4WN SOPN    Sig: Inject 0.75 mg into the skin once a week.    Dispense:  3 mL    Refill:  3    Order Specific Question:   Supervising Provider    Answer:   SIMPSON, MARGARET E [[id number]]  . metFORMIN (GLUCOPHAGE) 1000 MG tablet    Sig: Take 1 tablet (1,000 mg total) by mouth 2 (two) times daily with a meal.    Dispense:  180 tablet    Refill:  1    Order Specific Question:   Supervising Provider    Answer:   Jacklynn Bue    Patient to follow-up in 06/28/2020  Note: This dictation was prepared with Dragon dictation along with smaller phrase technology. Similar  sounding words can be transcribed inadequately or may not be corrected upon review. Any transcriptional errors that result from this process are unintentional.      Perlie Mayo, NP

## 2020-06-28 ENCOUNTER — Ambulatory Visit (INDEPENDENT_AMBULATORY_CARE_PROVIDER_SITE_OTHER): Payer: 59 | Admitting: Nurse Practitioner

## 2020-06-28 ENCOUNTER — Encounter: Payer: Self-pay | Admitting: Nurse Practitioner

## 2020-06-28 ENCOUNTER — Other Ambulatory Visit: Payer: Self-pay

## 2020-06-28 DIAGNOSIS — Z0000 Encounter for general adult medical examination without abnormal findings: Secondary | ICD-10-CM

## 2020-06-28 DIAGNOSIS — Z139 Encounter for screening, unspecified: Secondary | ICD-10-CM | POA: Diagnosis not present

## 2020-06-28 NOTE — Addendum Note (Signed)
Addended by: Demetrius Revel on: 06/28/2020 10:36 AM   Modules accepted: Orders

## 2020-06-28 NOTE — Progress Notes (Unsigned)
Virtual Visit via Telephone Note   This visit type was conducted due to national recommendations for restrictions regarding the COVID-19 Pandemic (e.g. social distancing) in an effort to limit this patient's exposure and mitigate transmission in our community.  Due to his co-morbid illnesses, this patient is at least at moderate risk for complications without adequate follow up.  This format is felt to be most appropriate for this patient at this time.  The patient did not have access to video technology/had technical difficulties with video requiring transitioning to audio format only (telephone).  All issues noted in this document were discussed and addressed.  No physical exam could be performed with this format.  Please refer to the patient's chart for his  consent to telehealth for [facility].    Date:  06/29/2020   ID:  Jim Salazar, DOB [birth year], MRN [id number] The patient was identified using 2 identifiers.  Patient Location: Home Provider Location: Office/Clinic  PCP:  Perlie Mayo, NP  Cardiologist:  Fransico Him, MD   Electrophysiologist:  None   Evaluation Performed:  Follow-Up Visit  Chief Complaint:  F/U  History of Present Illness:    Jim Salazar is a 69 y.o. male with history of hypertension, DM type II, hypothyroidism who presented to the emergency room 08/2017 with 15-minute episode of left upper extremity numbness up to his neck jaw and tongue.  He also endorsed anterior chest pain.  Troponins were negative x3 and EKG with nonspecific changes.  MRI showed no acute CVA but concern for possible TIA.  He was also hypertensive.  Nuclear stress test read out was inferior infarct with no ischemia but 2D echo showed normal LV EF with no inferior wall motion abnormality.  Inferior defect on nuclear stress test likely related to diaphragmatic attenuation artifact and is worse on rest images.  Dr. Radford Pax recommended aggressive BP control as his symptoms were likely  related to poorly controlled blood pressure.  If he has recurrent chest pain after blood pressure is controlled he may need a coronary CTA or cath.   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  Event monitor was normal, coronary CTA showed low calcium score of 3.2 with mixed plaque in the proximal LAD with mild stenosis, minimal calcified plaque in the proximal left circumflex and mid RCA without stenosis., 2D echo normal LV function.  Diuretic was stopped placed on lisinopril.   I had a telemedicine visit with the patient 04/30/2019 at which time his blood pressure was up he had not taken his medications yet.  I asked him to monitor his blood pressure 3-4 times a week and call if elevated.  Patient has been doing well. Denies chest pain, palpitations, dyspnea, palpitations, dizziness or presyncope. Exercises 30-40 min daily.Walks 2 miles regularly.  The patient does not have symptoms concerning for COVID-19 infection (fever, chills, cough, or new shortness of breath).    Past Medical History:  Diagnosis Date  . CAD (coronary artery disease), native coronary artery    mild nonobstructive plaque in LCx and RCA and mildly obstructive minimally calcified plaque in mid LAD on coronary CTA 11/2017  . Depression   . Diabetes mellitus without complication ([place])   . Hypertension   . Hypothyroidism, unspecified   . Left sided numbness 09/17/2017  . Major depressive disorder, single episode, unspecified   . Need for immunization against influenza 06/12/2019  . Need for vaccination against Streptococcus  pneumoniae using pneumococcal conjugate vaccine 13 06/12/2019  . Obesity, unspecified   . Right upper quadrant abdominal pain 09/22/2019  . Screening for prostate cancer 06/12/2019  . Syncope 10/17/2017  . Thyroid disease   . Tremor, essential 11/30/2017  . Type 2 diabetes mellitus with hyperglycemia [facility])    Past Surgical History:   Procedure Laterality Date  . APPENDECTOMY    . TONSILLECTOMY       Current Meds  Medication Sig  . Accu-Chek FastClix Lancets MISC   . amLODipine (NORVASC) 10 MG tablet Take 1 tablet (10 mg total) by mouth daily. AT BEDTIME. Please make overdue appt with Dr.Turner before anymore refills.3rd and Final Attempt  . aspirin EC 81 MG tablet Take 81 mg by mouth every evening.  Marland Kitchen atorvastatin (LIPITOR) 10 MG tablet TAKE 1 TABLET (10 MG TOTAL) BY MOUTH DAILY.  . cetirizine (ZYRTEC) 10 MG tablet Take 1 tablet (10 mg total) by mouth daily.  . chlorthalidone (HYGROTON) 25 MG tablet Take 1 tablet (25 mg total) by mouth daily.  Marland Kitchen dexlansoprazole (DEXILANT) 60 MG capsule Take 60 mg by mouth as needed. FOR REFLUX  . Dulaglutide (TRULICITY) 9.67 YO/3.7CH SOPN Inject 0.75 mg into the skin once a week.  . fluticasone (FLONASE) 50 MCG/ACT nasal spray Place 2 sprays into both nostrils daily.  Marland Kitchen FREESTYLE LITE test strip   . levothyroxine (SYNTHROID) 125 MCG tablet Take 1 tablet (125 mcg total) by mouth daily.  Marland Kitchen lisinopril (ZESTRIL) 40 MG tablet Take 1 tablet (40 mg total) by mouth daily.  . metFORMIN (GLUCOPHAGE) 1000 MG tablet Take 1 tablet (1,000 mg total) by mouth 2 (two) times daily with a meal.  . PARoxetine (PAXIL) 20 MG tablet Take 1 tablet (20 mg total) by mouth daily.     Allergies:   Patient has no known allergies.   Social History   Tobacco Use  . Smoking status: Never Smoker  . Smokeless tobacco: Never Used  Vaping Use  . Vaping Use: Never used  Substance Use Topics  . Alcohol use: No  . Drug use: No     Family Hx: The patient's family history includes Cancer in his father; Diabetes Mellitus II in his mother; Hypertension in an other family member.  ROS:   Please see the history of present illness.      All other systems reviewed and are negative.   Prior CV studies:   The following studies were reviewed today: 2D Echo 09/17/17   Study Conclusions   - Left ventricle: The  cavity size was normal. Wall thickness was   increased in a pattern of mild LVH. Systolic function was normal.   The estimated ejection fraction was in the range of 60% to 65%. - Mitral valve: There was mild regurgitation.     Myoview 09/18/17  IMPRESSION: 1. 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 No definite peri-infarct ischemia.   2. Inferior wall showing no wall thickening or wall motion. Mild global hypokinesis otherwise.   3. Left ventricular ejection fraction 40%   4. Non invasive risk stratification*: High risk     Reviewed the myoview with Dr. Radford Pax who felt his symptom likely is related to uncontrolled BP. Myoview result was felt to be lower risk and maybe diaphragmatic attenuation. No plan for cath or coronary CT unless chest pain recur.       CTA 3/18/19IMPRESSION: 1. No acute pulmonary embolus. 2. No active pulmonary disease. 3. Right upper quadrant cyst likely arising the right  kidney, incompletely included measuring at least 3 3 cm   Aortic Atherosclerosis (ICD10-I70.0).     Electronically Signed   By: Ashley Royalty M.D.   On: 10/08/2017 00:50   IMPRESSION: Brain MRI:   1. No acute finding. 2. Minimal white matter disease, likely remote microvascular ischemia.   Intracranial MRA:   Atheromatous type narrowing of the mid basilar that is mild. Atheromatous change also likely present in distal left PCA branches.   Neck MRA:   Negative.     Electronically Signed   By: Monte Fantasia M.D.   On: 09/17/2017 07:36         Labs/Other Tests and Data Reviewed:    EKG:  No ECG reviewed.  Recent Labs: 09/23/2019: Hemoglobin 14.8; Platelets 250; TSH 7.26 06/22/2020: ALT 12; BUN 19; Creat 1.02; Potassium 4.3; Sodium 135   Recent Lipid Panel Lab Results  Component Value Date/Time   CHOL 107 09/23/2019 07:46 AM   TRIG 163 (H) 09/23/2019 07:46 AM   HDL 36 (L) 09/23/2019 07:46 AM   CHOLHDL 3.0 09/23/2019 07:46 AM   LDLCALC 47  09/23/2019 07:46 AM    Wt Readings from Last 3 Encounters:  06/29/20 213 lb (96.6 kg)  06/24/20 213 lb (96.6 kg)  04/29/20 209 lb 12.8 oz (95.2 kg)     Risk Assessment/Calculations:      Objective:    Vital Signs:  BP 128/78   Pulse 68   Ht 6' (1.829 m)   Wt 213 lb (96.6 kg)   BMI 28.89 kg/m    VITAL SIGNS:  reviewed  ASSESSMENT & PLAN:    CAD with history of chest pain felt secondary to poorly controlled hypertension.    coronary CT 09/2017 without obstructive disease. No chest pain. LDL 47 on low dose lipitor 10 mg daily. Getting regular exercise. Refill meds  Hypertension BP well controlled.   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 Seeing a nutritionist and started on Trulicity.    Shared Decision Making/Informed Consent        COVID-19 Education: The signs and symptoms of COVID-19 were discussed with the patient and how to seek care for testing (follow up with PCP or arrange E-visit).   The importance of social distancing was discussed today.  Time:   Today, I have spent 8:45 minutes with the patient with telehealth technology discussing the above problems.     Medication Adjustments/Labs and Tests Ordered: Current medicines are reviewed at length with the patient today.  Concerns regarding medicines are outlined above.   Tests Ordered: No orders of the defined types were placed in this encounter.   Medication Changes: No orders of the defined types were placed in this encounter.   Follow Up:  In Person in 1 year(s) Dr. Radford Pax  Signed, Ermalinda Barrios, PA-C  06/29/2020 9:39 AM    [place]

## 2020-06-28 NOTE — Patient Instructions (Addendum)
Mr. Jim Salazar , Thank you for taking time to come for your Medicare Wellness Visit. I appreciate your ongoing commitment to your health goals. Please review the following plan we discussed and let me know if I can assist you in the future.   Screening recommendations/referrals: Colonoscopy: GI referral placed for colonoscopy.   Recommended yearly ophthalmology/optometry visit for glaucoma screening and checkup Recommended yearly dental visit for hygiene and checkup  Vaccinations: Influenza vaccine: Completed  Pneumococcal vaccine: Completed  Tdap vaccine: Completed; DUE: 12/13/20  Shingles vaccine: Education provided.     Advanced directives: N/A   Conditions/risks identified: None   Next appointment: 09/22/20 @ 8 am with Cherly Beach, NP   Preventive Care 67 Years and Older, Male Preventive care refers to lifestyle choices and visits with your health care provider that can promote health and wellness. What does preventive care include?  A yearly physical exam. This is also called an annual well check.  Dental exams once or twice a year.  Routine eye exams. Ask your health care provider how often you should have your eyes checked.  Personal lifestyle choices, including:  Daily care of your teeth and gums.  Regular physical activity.  Eating a healthy diet.  Avoiding tobacco and drug use.  Limiting alcohol use.  Practicing safe sex.  Taking low doses of aspirin every day.  Taking vitamin and mineral supplements as recommended by your health care provider. What happens during an annual well check? The services and screenings done by your health care provider during your annual well check will depend on your age, overall health, lifestyle risk factors, and family history of disease. Counseling  Your health care provider may ask you questions about your:  Alcohol use.  Tobacco use.  Drug use.  Emotional well-being.  Home and relationship well-being.  Sexual  activity.  Eating habits.  History of falls.  Memory and ability to understand (cognition).  Work and work Statistician. Screening  You may have the following tests or measurements:  Height, weight, and BMI.  Blood pressure.  Lipid and cholesterol levels. These may be checked every 5 years, or more frequently if you are over 62 years old.  Skin check.  Lung cancer screening. You may have this screening every year starting at age 51 if you have a 30-pack-year history of smoking and currently smoke or have quit within the past 15 years.  Fecal occult blood test (FOBT) of the stool. You may have this test every year starting at age 11.  Flexible sigmoidoscopy or colonoscopy. You may have a sigmoidoscopy every 5 years or a colonoscopy every 10 years starting at age 88.  Prostate cancer screening. Recommendations will vary depending on your family history and other risks.  Hepatitis C blood test.  Hepatitis B blood test.  Sexually transmitted disease (STD) testing.  Diabetes screening. This is done by checking your blood sugar (glucose) after you have not eaten for a while (fasting). You may have this done every 1-3 years.  Abdominal aortic aneurysm (AAA) screening. You may need this if you are a current or former smoker.  Osteoporosis. You may be screened starting at age 85 if you are at high risk. Talk with your health care provider about your test results, treatment options, and if necessary, the need for more tests. Vaccines  Your health care provider may recommend certain vaccines, such as:  Influenza vaccine. This is recommended every year.  Tetanus, diphtheria, and acellular pertussis (Tdap, Td) vaccine. You may need a  Td booster every 10 years.  Zoster vaccine. You may need this after age 8.  Pneumococcal 13-valent conjugate (PCV13) vaccine. One dose is recommended after age 37.  Pneumococcal polysaccharide (PPSV23) vaccine. One dose is recommended after age 17.  Talk to your health care provider about which screenings and vaccines you need and how often you need them. This information is not intended to replace advice given to you by your health care provider. Make sure you discuss any questions you have with your health care provider. Document Released: 08/06/2015 Document Revised: 03/29/2016 Document Reviewed: 05/11/2015 Elsevier Interactive Patient Education  2017 [place] Prevention in the Home Falls can cause injuries. They can happen to people of all ages. There are many things you can do to make your home safe and to help prevent falls. What can I do on the outside of my home?  Regularly fix the edges of walkways and driveways and fix any cracks.  Remove anything that might make you trip as you walk through a door, such as a raised step or threshold.  Trim any bushes or trees on the path to your home.  Use bright outdoor lighting.  Clear any walking paths of anything that might make someone trip, such as rocks or tools.  Regularly check to see if handrails are loose or broken. Make sure that both sides of any steps have handrails.  Any raised decks and porches should have guardrails on the edges.  Have any leaves, snow, or ice cleared regularly.  Use sand or salt on walking paths during winter.  Clean up any spills in your garage right away. This includes oil or grease spills. What can I do in the bathroom?  Use night lights.  Install grab bars by the toilet and in the tub and shower. Do not use towel bars as grab bars.  Use non-skid mats or decals in the tub or shower.  If you need to sit down in the shower, use a plastic, non-slip stool.  Keep the floor dry. Clean up any water that spills on the floor as soon as it happens.  Remove soap buildup in the tub or shower regularly.  Attach bath mats securely with double-sided non-slip rug tape.  Do not have throw rugs and other things on the floor that can make you  trip. What can I do in the bedroom?  Use night lights.  Make sure that you have a light by your bed that is easy to reach.  Do not use any sheets or blankets that are too big for your bed. They should not hang down onto the floor.  Have a firm chair that has side arms. You can use this for support while you get dressed.  Do not have throw rugs and other things on the floor that can make you trip. What can I do in the kitchen?  Clean up any spills right away.  Avoid walking on wet floors.  Keep items that you use a lot in easy-to-reach places.  If you need to reach something above you, use a strong step stool that has a grab bar.  Keep electrical cords out of the way.  Do not use floor polish or wax that makes floors slippery. If you must use wax, use non-skid floor wax.  Do not have throw rugs and other things on the floor that can make you trip. What can I do with my stairs?  Do not leave any items on the stairs.  Make sure that there are handrails on both sides of the stairs and use them. Fix handrails that are broken or loose. Make sure that handrails are as long as the stairways.  Check any carpeting to make sure that it is firmly attached to the stairs. Fix any carpet that is loose or worn.  Avoid having throw rugs at the top or bottom of the stairs. If you do have throw rugs, attach them to the floor with carpet tape.  Make sure that you have a light switch at the top of the stairs and the bottom of the stairs. If you do not have them, ask someone to add them for you. What else can I do to help prevent falls?  Wear shoes that:  Do not have high heels.  Have rubber bottoms.  Are comfortable and fit you well.  Are closed at the toe. Do not wear sandals.  If you use a stepladder:  Make sure that it is fully opened. Do not climb a closed stepladder.  Make sure that both sides of the stepladder are locked into place.  Ask someone to hold it for you, if  possible.  Clearly mark and make sure that you can see:  Any grab bars or handrails.  First and last steps.  Where the edge of each step is.  Use tools that help you move around (mobility aids) if they are needed. These include:  Canes.  Walkers.  Scooters.  Crutches.  Turn on the lights when you go into a dark area. Replace any light bulbs as soon as they burn out.  Set up your furniture so you have a clear path. Avoid moving your furniture around.  If any of your floors are uneven, fix them.  If there are any pets around you, be aware of where they are.  Review your medicines with your doctor. Some medicines can make you feel dizzy. This can increase your chance of falling. Ask your doctor what other things that you can do to help prevent falls. This information is not intended to replace advice given to you by your health care provider. Make sure you discuss any questions you have with your health care provider. Document Released: 05/06/2009 Document Revised: 12/16/2015 Document Reviewed: 08/14/2014 Elsevier Interactive Patient Education  2017 [employer].

## 2020-06-28 NOTE — Progress Notes (Addendum)
Subjective:   Jim Salazar is a 69 y.o. male who presents for Medicare Annual/Subsequent preventive examination.        Objective:    There were no vitals filed for this visit. There is no height or weight on file to calculate BMI.  Advanced Directives 10/07/2017 09/16/2017  Does Patient Have a Medical Advance Directive? No No  Would patient like information on creating a medical advance directive? - No - Patient declined    Current Medications (verified) Outpatient Encounter Medications as of 06/28/2020  Medication Sig  . Accu-Chek FastClix Lancets MISC   . amLODipine (NORVASC) 10 MG tablet Take 1 tablet (10 mg total) by mouth daily. AT BEDTIME. Please make overdue appt with Jim.Turner before anymore refills.3rd and Final Attempt  . aspirin EC 81 MG tablet Take 81 mg by mouth every evening.  Marland Kitchen atorvastatin (LIPITOR) 10 MG tablet TAKE 1 TABLET (10 MG TOTAL) BY MOUTH DAILY.  . cetirizine (ZYRTEC) 10 MG tablet Take 1 tablet (10 mg total) by mouth daily.  . chlorthalidone (HYGROTON) 25 MG tablet Take 1 tablet (25 mg total) by mouth daily.  Marland Kitchen dexlansoprazole (DEXILANT) 60 MG capsule Take 60 mg by mouth as needed. FOR REFLUX  . Dulaglutide (TRULICITY) 6.50 PT/4.6FK SOPN Inject 0.75 mg into the skin once a week.  . fluticasone (FLONASE) 50 MCG/ACT nasal spray Place 2 sprays into both nostrils daily.  Marland Kitchen FREESTYLE LITE test strip   . levothyroxine (SYNTHROID) 125 MCG tablet Take 1 tablet (125 mcg total) by mouth daily.  Marland Kitchen lisinopril (ZESTRIL) 40 MG tablet Take 1 tablet (40 mg total) by mouth daily.  . metFORMIN (GLUCOPHAGE) 1000 MG tablet Take 1 tablet (1,000 mg total) by mouth 2 (two) times daily with a meal.  . PARoxetine (PAXIL) 20 MG tablet Take 1 tablet (20 mg total) by mouth daily.   No facility-administered encounter medications on file as of 06/28/2020.    Allergies (verified) Patient has no known allergies.   History: Past Medical History:  Diagnosis Date  . CAD  (coronary artery disease), native coronary artery    mild nonobstructive plaque in LCx and RCA and mildly obstructive minimally calcified plaque in mid LAD on coronary CTA 11/2017  . Depression   . Diabetes mellitus without complication ([place])   . Hypertension   . Hypothyroidism, unspecified   . Left sided numbness 09/17/2017  . Major depressive disorder, single episode, unspecified   . Need for immunization against influenza 06/12/2019  . Need for vaccination against Streptococcus pneumoniae using pneumococcal conjugate vaccine 13 06/12/2019  . Obesity, unspecified   . Right upper quadrant abdominal pain 09/22/2019  . Screening for prostate cancer 06/12/2019  . Syncope 10/17/2017  . Thyroid disease   . Tremor, essential 11/30/2017  . Type 2 diabetes mellitus with hyperglycemia [facility])    Past Surgical History:  Procedure Laterality Date  . APPENDECTOMY    . TONSILLECTOMY     Family History  Problem Relation Age of Onset  . Hypertension Other   . Diabetes Mellitus II Mother   . Cancer Father        lung   Social History   Socioeconomic History  . Marital status: Married    Spouse name: Jim Salazar   . Number of children: 2  . Years of education: College  . Highest education level: Not on file  Occupational History  . Occupation: semi retired     Comment: Theme park manager   Tobacco Use  . Smoking status: Never  Smoker  . Smokeless tobacco: Never Used  Vaping Use  . Vaping Use: Never used  Substance and Sexual Activity  . Alcohol use: No  . Drug use: No  . Sexual activity: Yes    Birth control/protection: None  Other Topics Concern  . 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 Difficulty of Paying Living Expenses: Not hard at all  Food Insecurity: No Food Insecurity  . Worried About Charity fundraiser in the Last Year: Never true  . Ran Out of Food in the Last Year: Never true  Transportation Needs: No Transportation Needs  . Lack of Transportation (Medical): No  . Lack of Transportation (Non-Medical): No  Physical Activity: Insufficiently Active  . Days of Exercise per Week: 5 days  . Minutes of Exercise per Session: 20 min  Stress: No Stress Concern Present  . Feeling of Stress : Only a little  Social Connections: Moderately Integrated  . Frequency of Communication with Friends and Family: More than three times a week  . Frequency of Social Gatherings with Friends and Family: More than three times a week  . Attends Religious Services: More than 4 times per year  . Active Member of Clubs or Organizations: No  . Attends Archivist Meetings: Never  . Marital Status: Married    Tobacco Counseling Counseling given: Not Answered                    Diabetic? Yes          Activities of Daily Living In your present state of health, do you have any difficulty performing the following activities: 12/11/2019  Hearing? N  Vision? N  Difficulty concentrating or making decisions? N  Walking or climbing stairs? N  Dressing or bathing? N  Doing errands, shopping? N  Some recent data might be hidden    Patient Care Team: Jim Mayo, NP as PCP - General (Family Medicine) Jim Margarita, MD as PCP - Cardiology (Cardiology)  Indicate any recent Medical Services you may have received from other than [facility] providers in the past year (date may be approximate).     Assessment:   This is a routine wellness examination for Eh.  Hearing/Vision screen No exam data  present  Dietary issues and exercise activities discussed:    Goals   None    Depression Screen PHQ 2/9 Scores 06/24/2020 04/29/2020 04/07/2020 01/15/2020 12/11/2019 10/22/2019 06/12/2019  PHQ - 2 Score 0 0 0 0 0 0 0  PHQ- 9 Score 0 0 - 0 - - -  Exception Documentation - - - - - - Medical reason    Fall Risk Fall Risk  06/24/2020 04/29/2020 04/07/2020 01/15/2020 12/11/2019  Falls in the past year? 0 0 0 0 0  Number falls in past yr: 0 0 -  0 0  Injury with Fall? 0 0 - 0 0  Risk for fall due to : No Fall Risks No Fall Risks - No Fall Risks -  Follow up Falls evaluation completed Falls evaluation completed - Falls evaluation completed -    FALL RISK PREVENTION PERTAINING TO THE HOME:  Any stairs in or around the home? Yes  If so, are there any without handrails? Yes  Home free of loose throw rugs in walkways, pet beds, electrical cords, etc? Yes  Adequate lighting in your home to reduce risk of falls? Yes   ASSISTIVE DEVICES UTILIZED TO PREVENT FALLS:  Life alert? No  Use of a cane, walker or w/c? No  Grab bars in the bathroom? No  Shower chair or bench in shower? No  Elevated toilet seat or a handicapped toilet? No   TIMED UP AND GO:  Was the test performed? No .     Cognitive Function:        Immunizations Immunization History  Administered Date(s) Administered  . Fluad Quad(high Dose 65+) 04/29/2020  . Influenza Inj Mdck Quad Pf 05/23/2016  . Influenza Inj Mdck Quad With Preservative 05/24/2017  . Influenza, High Dose Seasonal PF 05/25/2018  . Influenza,inj,Quad PF,6+ Mos 06/12/2019  . Influenza-Unspecified 08/04/2012, 04/22/2013, 05/24/2017  . Janssen (J&J) SARS-COV-2 Vaccination 10/17/2019  . Pneumococcal Conjugate-13 06/12/2019  . Pneumococcal Polysaccharide-23 07/20/2017    TDAP status: Up to date  Flu Vaccine status: Up to date  Pneumococcal vaccine status: Up to date  Covid-19 vaccine status: Completed vaccines  Qualifies for Shingles Vaccine? Yes     Zostavax completed No   Shingrix Completed?: No.    Education has been provided regarding the importance of this vaccine. Patient has been advised to call insurance company to determine out of pocket expense if they have not yet received this vaccine. Advised may also receive vaccine at local pharmacy or Health Dept. Verbalized acceptance and understanding.  Screening Tests Health Maintenance  Topic Date Due  . Hepatitis C Screening  Never done  . COLONOSCOPY  Never done  . OPHTHALMOLOGY EXAM  12/20/2019  . FOOT EXAM  06/11/2020  . TETANUS/TDAP  12/10/2020 (Originally 09/06/1969)  . HEMOGLOBIN A1C  12/20/2020  . PNA vac Low Risk Adult (2 of 2 - PPSV23) 07/20/2022  . INFLUENZA VACCINE  Completed  . COVID-19 Vaccine  Completed    Health Maintenance  Health Maintenance Due  Topic Date Due  . Hepatitis C Screening  Never done  . COLONOSCOPY  Never done  . OPHTHALMOLOGY EXAM  12/20/2019  . FOOT EXAM  06/11/2020    Colorectal Screening: Referral placed to [facility]) for Colonoscopy.   Lung Cancer Screening: (Low Dose CT Chest recommended if Age 4-80 years, 30 pack-year currently smoking OR have quit w/in 15years.) does not qualify.    Additional Screening:  Hepatitis C Screening: does qualify; Completed.   Vision Screening: Recommended annual ophthalmology exams for early detection of glaucoma and other disorders of the eye. Is the patient up to date with their annual eye exam?  Yes    Who is the provider or what is the name of the office in which the patient attends annual eye exams? Jim. Jorja Loa  If pt is not established with a provider, would they like to be referred to a provider to establish care? No .   Dental Screening: Recommended annual dental exams for proper oral hygiene  Community Resource Referral / Chronic Care Management: CRR required this visit?  No   CCM required this visit?  No      Plan:     I have personally reviewed and noted the  following in the patient's chart:   . Medical and social history . Use of alcohol, tobacco or illicit drugs  . Current medications and supplements . Functional ability and status . Nutritional status . Physical activity . Advanced directives . List of other physicians . Hospitalizations, surgeries, and ER visits in previous 12 months . Vitals . Screenings to include cognitive, depression, and falls . Referrals and appointments  In addition, I have reviewed and discussed with patient certain preventive protocols, quality metrics, and best practice recommendations. A written personalized care plan for preventive services as well as general preventive health recommendations were provided to patient.     Lonn Georgia, LPN   06/23/9757   Nurse Notes: AWV conducted over the phone with pt consent to televisit via audio. Pt was in the home at the time of the visit and provider present in the office. Call took approx 20 min.   Date:  06/28/2020   Location of Patient: Home Location of Provider: Office Consent was obtain for visit to be over via telehealth. I verified that I am speaking with the correct person using two identifiers.  I connected with  Mancel Parsons on 06/28/20 via telephone and verified that I am speaking with the correct person using two identifiers.   I discussed the limitations of evaluation and management by telemedicine. The patient expressed understanding and agreed to proceed.  AWV questions answered by Lonn Georgia, LPN.  I called the patient to confirm the responses to the AWV questions and addressed all concerns. Additionally, I put in referral to GI for colon cancer screening. -Donneta Romberg, DNP, AGNP-C

## 2020-06-29 ENCOUNTER — Encounter: Payer: Self-pay | Admitting: Physician Assistant

## 2020-06-29 ENCOUNTER — Telehealth: Payer: Self-pay

## 2020-06-29 ENCOUNTER — Telehealth (INDEPENDENT_AMBULATORY_CARE_PROVIDER_SITE_OTHER): Payer: 59 | Admitting: Physician Assistant

## 2020-06-29 VITALS — BP 128/78 | HR 68 | Ht 72.0 in | Wt 213.0 lb

## 2020-06-29 DIAGNOSIS — I2510 Atherosclerotic heart disease of native coronary artery without angina pectoris: Secondary | ICD-10-CM

## 2020-06-29 DIAGNOSIS — I10 Essential (primary) hypertension: Secondary | ICD-10-CM

## 2020-06-29 DIAGNOSIS — E669 Obesity, unspecified: Secondary | ICD-10-CM | POA: Diagnosis not present

## 2020-06-29 DIAGNOSIS — E1169 Type 2 diabetes mellitus with other specified complication: Secondary | ICD-10-CM

## 2020-06-29 DIAGNOSIS — R55 Syncope and collapse: Secondary | ICD-10-CM

## 2020-06-29 NOTE — Telephone Encounter (Signed)
  Patient Consent for Virtual Visit         Jim Salazar has provided verbal consent on 06/29/2020 for a virtual visit (video or telephone).   CONSENT FOR VIRTUAL VISIT FOR:  Jim Salazar  By participating in this virtual visit I agree to the following:  I hereby voluntarily request, consent and authorize [facility] and its employed or contracted physicians, physician assistants, nurse practitioners or other licensed health care professionals (the Practitioner), to provide me with telemedicine health care services (the "Services") as deemed necessary by the treating Practitioner. I acknowledge and consent to receive the Services by the Practitioner via telemedicine. I understand that the telemedicine visit will involve communicating with the Practitioner through live audiovisual communication technology and the disclosure of certain medical information by electronic transmission. I acknowledge that I have been given the opportunity to request an in-person assessment or other available alternative prior to the telemedicine visit and am voluntarily participating in the telemedicine visit.  I understand that I have the right to withhold or withdraw my consent to the use of telemedicine in the course of my care at any time, without affecting my right to future care or treatment, and that the Practitioner or I may terminate the telemedicine visit at any time. I understand that I have the right to inspect all information obtained and/or recorded in the course of the telemedicine visit and may receive copies of available information for a reasonable fee.  I understand that some of the potential risks of receiving the Services via telemedicine include:  Marland Kitchen Delay or interruption in medical evaluation due to technological equipment failure or disruption; . Information transmitted may not be sufficient (e.g. poor resolution of images) to allow for appropriate medical decision making by the Practitioner;  and/or  . In rare instances, security protocols could fail, causing a breach of personal health information.  Furthermore, I acknowledge that it is my responsibility to provide information about my medical history, conditions and care that is complete and accurate to the best of my ability. I acknowledge that Practitioner's advice, recommendations, and/or decision may be based on factors not within their control, such as incomplete or inaccurate data provided by me or distortions of diagnostic images or specimens that may result from electronic transmissions. I understand that the practice of medicine is not an exact science and that Practitioner makes no warranties or guarantees regarding treatment outcomes. I acknowledge that a copy of this consent can be made available to me via my patient portal ([place]), or I can request a printed copy by calling the office of [facility].    I understand that my insurance will be billed for this visit.   I have read or had this consent read to me. . I understand the contents of this consent, which adequately explains the benefits and risks of the Services being provided via telemedicine.  . I have been provided ample opportunity to ask questions regarding this consent and the Services and have had my questions answered to my satisfaction. . I give my informed consent for the services to be provided through the use of telemedicine in my medical care

## 2020-06-29 NOTE — Patient Instructions (Signed)
Medication Instructions:  Your physician recommends that you continue on your current medications as directed. Please refer to the Current Medication list given to you today.  *If you need a refill on your cardiac medications before your next appointment, please call your pharmacy*   Lab Work: None If you have labs (blood work) drawn today and your tests are completely normal, you will receive your results only by: Marland Kitchen MyChart Message (if you have MyChart) OR . A paper copy in the mail If you have any lab test that is abnormal or we need to change your treatment, we will call you to review the results.   Testing/Procedures: None   Follow-Up: At [facility], you and your health needs are our priority.  As part of our continuing mission to provide you with exceptional heart care, we have created designated Provider Care Teams.  These Care Teams include your primary Cardiologist (physician) and Advanced Practice Providers (APPs -  Physician Assistants and Nurse Practitioners) who all work together to provide you with the care you need, when you need it.  We recommend signing up for the patient portal called "MyChart".  Sign up information is provided on this After Visit Summary.  MyChart is used to connect with patients for Virtual Visits (Telemedicine).  Patients are able to view lab/test results, encounter notes, upcoming appointments, etc.  Non-urgent messages can be sent to your provider as well.   To learn more about what you can do with MyChart, go to [email or web address].    Your next appointment:   1 year(s)  The format for your next appointment:   In Person   Provider:   You may see Fransico Him, MD or one of the following Advanced Practice Providers on your designated Care Team:    Melina Copa, PA-C  Ermalinda Barrios, PA-C

## 2020-07-07 ENCOUNTER — Other Ambulatory Visit: Payer: Self-pay

## 2020-07-07 ENCOUNTER — Other Ambulatory Visit: Payer: Self-pay | Admitting: Family Medicine

## 2020-07-07 DIAGNOSIS — E039 Hypothyroidism, unspecified: Secondary | ICD-10-CM

## 2020-07-07 MED ORDER — LEVOTHYROXINE SODIUM 125 MCG PO TABS: 125.0000 ug | ORAL_TABLET | Freq: Every day | ORAL | 1 refills | Status: DC

## 2020-07-07 MED FILL — LEVOTHYROXINE SODIUM 125 MC: 125 | 90 days supply | Qty: 90 | Fill #0

## 2020-08-03 ENCOUNTER — Other Ambulatory Visit: Payer: Self-pay | Admitting: Physician Assistant

## 2020-08-03 ENCOUNTER — Other Ambulatory Visit: Payer: Self-pay | Admitting: Family Medicine

## 2020-08-03 MED ORDER — PAROXETINE HCL 20 MG PO TABS: 20.0000 mg | ORAL_TABLET | Freq: Every day | ORAL | 1 refills | Status: DC

## 2020-08-04 ENCOUNTER — Other Ambulatory Visit: Payer: Self-pay | Admitting: Cardiology

## 2020-08-04 MED FILL — JANUMET 50-1,000 MG TABLET: 50-1000 | 30 days supply | Qty: 60 | Fill #2

## 2020-08-04 MED FILL — LISINOPRIL 40 MG TABS: 40 | 90 days supply | Qty: 90 | Fill #0

## 2020-09-15 MED FILL — METFORMIN HCL 1000 MG TABS: 1000 | 90 days supply | Qty: 180 | Fill #1

## 2020-09-15 MED FILL — PARoxetine HCL 20 MG TABS: 20 | 90 days supply | Qty: 90 | Fill #0

## 2020-09-22 ENCOUNTER — Ambulatory Visit: Payer: 59 | Admitting: Family Medicine

## 2020-09-22 ENCOUNTER — Ambulatory Visit (INDEPENDENT_AMBULATORY_CARE_PROVIDER_SITE_OTHER): Payer: 59 | Admitting: Internal Medicine

## 2020-09-22 ENCOUNTER — Encounter: Payer: Self-pay | Admitting: Internal Medicine

## 2020-09-22 ENCOUNTER — Other Ambulatory Visit: Payer: Self-pay

## 2020-09-22 ENCOUNTER — Other Ambulatory Visit: Payer: Self-pay | Admitting: Internal Medicine

## 2020-09-22 VITALS — BP 131/79 | HR 74 | Resp 18 | Ht 72.0 in | Wt 212.1 lb

## 2020-09-22 DIAGNOSIS — E039 Hypothyroidism, unspecified: Secondary | ICD-10-CM | POA: Diagnosis not present

## 2020-09-22 DIAGNOSIS — E669 Obesity, unspecified: Secondary | ICD-10-CM | POA: Diagnosis not present

## 2020-09-22 DIAGNOSIS — E1169 Type 2 diabetes mellitus with other specified complication: Secondary | ICD-10-CM | POA: Diagnosis not present

## 2020-09-22 DIAGNOSIS — F329 Major depressive disorder, single episode, unspecified: Secondary | ICD-10-CM | POA: Diagnosis not present

## 2020-09-22 DIAGNOSIS — I10 Essential (primary) hypertension: Secondary | ICD-10-CM

## 2020-09-22 LAB — POCT GLYCOSYLATED HEMOGLOBIN (HGB A1C): HbA1c, POC (controlled diabetic range): 8.3 % — AB (ref 0.0–7.0)

## 2020-09-22 MED ORDER — TRULICITY 1.5 MG/0.5ML ~~LOC~~ SOAJ: 1.5000 mg | SUBCUTANEOUS | 3 refills | Status: DC

## 2020-09-22 NOTE — Assessment & Plan Note (Signed)
BP Readings from Last 1 Encounters:  09/22/20 131/79   Well-controlled Counseled for compliance with the medications Advised DASH diet and moderate exercise/walking, at least 150 mins/week

## 2020-09-22 NOTE — Progress Notes (Signed)
Established Patient Office Visit  Subjective:  Patient ID: Jim Salazar, male    DOB: Dec 18, 1950  Age: 70 y.o. MRN: [id number]  CC:  Chief Complaint  Patient presents with  . Follow-up    Follow up pt is fine today will check a1c today     [name]  is a 70 y.o. male with PMH as mentioned below who presents for for follow up for chronic conditions.  He has been feeling well overall.  HTN: BP is well-controlled. Takes medications regularly. Patient denies headache, dizziness, chest pain, dyspnea or palpitations.  DM: His HbA1C has improved to 8.3 from 10.1 now. He has been tolerating Trulicity 6.29 mg dose. Denies any polyuria or polydipsia. His blood glucose have been around 160-200 with few readings more than 200.  Hypothyroidism: He has been taking Levothyroxine 125 mcg QD. His last TSH level was elevated and has not had repeat TSH since 09/2019. Denies any constipation, diarrhea, weight change recently, hair or nail changes.  Past Medical History:  Diagnosis Date  . CAD (coronary artery disease), native coronary artery    mild nonobstructive plaque in LCx and RCA and mildly obstructive minimally calcified plaque in mid LAD on coronary CTA 11/2017  . Depression   . Diabetes mellitus without complication ([place])   . Hypertension   . Hypothyroidism, unspecified   . Left sided numbness 09/17/2017  . Major depressive disorder, single episode, unspecified   . Need for immunization against influenza 06/12/2019  . Need for vaccination against Streptococcus pneumoniae using pneumococcal conjugate vaccine 13 06/12/2019  . Obesity, unspecified   . Right upper quadrant abdominal pain 09/22/2019  . Screening for prostate cancer 06/12/2019  . Syncope 10/17/2017  . Thyroid disease   . Tremor, essential 11/30/2017  . Type 2 diabetes mellitus with hyperglycemia [facility])     Past Surgical History:  Procedure Laterality Date  . APPENDECTOMY    . TONSILLECTOMY      Family  History  Problem Relation Age of Onset  . Hypertension Other   . Diabetes Mellitus II Mother   . Cancer Father        lung    Social History   Socioeconomic History  . Marital status: Married    Spouse name: [name]   . Number of children: 2  . Years of education: College  . Highest education level: Not on file  Occupational History  . Occupation: semi retired     Comment: Theme park manager   Tobacco Use  . Smoking status: Never Smoker  . Smokeless tobacco: Never Used  Vaping Use  . Vaping Use: Never used  Substance and Sexual Activity  . Alcohol use: No  . Drug use: No  . Sexual activity: Yes    Birth control/protection: None  Other Topics Concern  . Not on file  Social History Narrative   Lives with 2nd wife [name] -married 10 years      3 dogs: 2 inside and 1 outside: Skimp; Ginger; Eve      Enjoys: model railroad Museum/gallery curator, Chiropodist some      Diet: eats all food groups -raisins    Caffeine use: 1-2 cups decaf coffee daily, diet coke sometimes   Water: 4-5 bottles a day       Wears seat belt   Does not use phone while driving-hands free    Oceanographer at home   Weapons in lock box   Social Determinants of Radio broadcast assistant Strain: Low  Risk   . Difficulty of Paying Living Expenses: Not hard at all  Food Insecurity: No Food Insecurity  . Worried About Charity fundraiser in the Last Year: Never true  . Ran Out of Food in the Last Year: Never true  Transportation Needs: No Transportation Needs  . Lack of Transportation (Medical): No  . Lack of Transportation (Non-Medical): No  Physical Activity: Sufficiently Active  . Days of Exercise per Week: 5 days  . Minutes of Exercise per Session: 30 min  Stress: No Stress Concern Present  . Feeling of Stress : Only a little  Social Connections: Moderately Integrated  . Frequency of Communication with Friends and Family: More than three times a week  . Frequency of Social Gatherings with Friends and Family: Three  times a week  . Attends Religious Services: More than 4 times per year  . Active Member of Clubs or Organizations: No  . Attends Archivist Meetings: Never  . Marital Status: Married  Human resources officer Violence: Not At Risk  . Fear of Current or Ex-Partner: No  . Emotionally Abused: No  . Physically Abused: No  . Sexually Abused: No    Outpatient Medications Prior to Visit  Medication Sig Dispense Refill  . Accu-Chek FastClix Lancets MISC     . amLODipine (NORVASC) 10 MG tablet Take 1 tablet (10 mg total) by mouth daily. AT BEDTIME. Please make overdue appt with Dr.Turner before anymore refills.3rd and Final Attempt 90 tablet 1  . aspirin EC 81 MG tablet Take 81 mg by mouth every evening.    Marland Kitchen atorvastatin (LIPITOR) 10 MG tablet TAKE 1 TABLET (10 MG TOTAL) BY MOUTH DAILY. 90 tablet 0  . cetirizine (ZYRTEC) 10 MG tablet Take 1 tablet (10 mg total) by mouth daily. 30 tablet 0  . chlorthalidone (HYGROTON) 25 MG tablet Take 1 tablet (25 mg total) by mouth daily. 30 tablet 2  . dexlansoprazole (DEXILANT) 60 MG capsule Take 60 mg by mouth as needed. FOR REFLUX    . fluticasone (FLONASE) 50 MCG/ACT nasal spray Place 2 sprays into both nostrils daily. 16 g 0  . FREESTYLE LITE test strip     . levothyroxine (SYNTHROID) 125 MCG tablet Take 1 tablet (125 mcg total) by mouth daily. 90 tablet 1  . lisinopril (ZESTRIL) 40 MG tablet TAKE 1 TABLET (40 MG TOTAL) BY MOUTH DAILY. 90 tablet 3  . metFORMIN (GLUCOPHAGE) 1000 MG tablet Take 1 tablet (1,000 mg total) by mouth 2 (two) times daily with a meal. 180 tablet 1  . PARoxetine (PAXIL) 20 MG tablet Take 1 tablet (20 mg total) by mouth daily. 90 tablet 1  . Dulaglutide (TRULICITY) 1.61 WR/6.0AV SOPN Inject 0.75 mg into the skin once a week. 3 mL 3   No facility-administered medications prior to visit.    No Known Allergies  ROS Review of Systems  Constitutional: Negative for chills and fever.  HENT: Negative for congestion and sore  throat.   Eyes: Negative for pain and discharge.  Respiratory: Negative for cough and shortness of breath.   Cardiovascular: Negative for chest pain and palpitations.  Gastrointestinal: Negative for constipation, diarrhea, nausea and vomiting.  Endocrine: Negative for polydipsia and polyuria.  Genitourinary: Negative for dysuria and hematuria.  Musculoskeletal: Negative for neck pain and neck stiffness.  Skin: Negative for rash.  Neurological: Negative for dizziness, weakness, numbness and headaches.  Psychiatric/Behavioral: Negative for agitation and behavioral problems.      Objective:    Physical  Exam Vitals reviewed.  Constitutional:      General: He is not in acute distress.    Appearance: He is not diaphoretic.  HENT:     Head: Normocephalic and atraumatic.     Nose: Nose normal.     Mouth/Throat:     Mouth: Mucous membranes are moist.  Eyes:     General: No scleral icterus.    Extraocular Movements: Extraocular movements intact.     Pupils: Pupils are equal, round, and reactive to light.  Cardiovascular:     Rate and Rhythm: Normal rate and regular rhythm.     Pulses: Normal pulses.     Heart sounds: Normal heart sounds. No murmur heard.   Pulmonary:     Breath sounds: Normal breath sounds. No wheezing or rales.  Musculoskeletal:     Cervical back: Neck supple. No tenderness.     Right lower leg: No edema.     Left lower leg: No edema.  Skin:    General: Skin is warm.     Findings: No rash.  Neurological:     General: No focal deficit present.     Mental Status: He is alert and oriented to person, place, and time.     Sensory: No sensory deficit.     Motor: No weakness.  Psychiatric:        Mood and Affect: Mood normal.        Behavior: Behavior normal.     BP 131/79 (BP Location: Left Arm, Patient Position: Sitting)   Pulse 74   Resp 18   Ht 6' (1.829 m)   Wt 212 lb 1.9 oz (96.2 kg)   SpO2 96%   BMI 28.77 kg/m  Wt Readings from Last 3  Encounters:  09/22/20 212 lb 1.9 oz (96.2 kg)  06/29/20 213 lb (96.6 kg)  06/24/20 213 lb (96.6 kg)     Health Maintenance Due  Topic Date Due  . Hepatitis C Screening  Never done  . COLONOSCOPY (Pts 45-53yrs Insurance coverage will need to be confirmed)  Never done  . COVID-19 Vaccine (2 - Booster for [employer] series) 12/12/2019  . OPHTHALMOLOGY EXAM  12/20/2019  . FOOT EXAM  06/11/2020    There are no preventive care reminders to display for this patient.  Lab Results  Component Value Date   TSH 7.26 (H) 09/23/2019   Lab Results  Component Value Date   WBC 8.3 09/23/2019   HGB 14.8 09/23/2019   HCT 43.7 09/23/2019   MCV 86.9 09/23/2019   PLT 250 09/23/2019   Lab Results  Component Value Date   NA 135 06/22/2020   K 4.3 06/22/2020   CO2 30 06/22/2020   GLUCOSE 303 (H) 06/22/2020   BUN 19 06/22/2020   CREATININE 1.02 06/22/2020   BILITOT 1.3 (H) 06/22/2020   ALKPHOS 60 09/17/2017   AST 14 06/22/2020   ALT 12 06/22/2020   PROT 6.4 06/22/2020   ALBUMIN 3.7 09/17/2017   CALCIUM 9.2 06/22/2020   ANIONGAP 14 10/07/2017   Lab Results  Component Value Date   CHOL 107 09/23/2019   Lab Results  Component Value Date   HDL 36 (L) 09/23/2019   Lab Results  Component Value Date   LDLCALC 47 09/23/2019   Lab Results  Component Value Date   TRIG 163 (H) 09/23/2019   Lab Results  Component Value Date   CHOLHDL 3.0 09/23/2019   Lab Results  Component Value Date   HGBA1C 8.3 (A) 09/22/2020  Assessment & Plan:   Problem List Items Addressed This Visit      Cardiovascular and Mediastinum   Essential hypertension    BP Readings from Last 1 Encounters:  09/22/20 131/79   Well-controlled Counseled for compliance with the medications Advised DASH diet and moderate exercise/walking, at least 150 mins/week         Endocrine   Diabetes mellitus type 2 in obese ([place]) - Primary    HbA1C: 8.3 today in office, better from previous - 10.1 On Metformin  and Trulicity, Increased dose of Trulicity to 1.5 mg Advised to follow diabetic diet On ACE and statin F/u CMP and lipid panel Diabetic eye exam: Advised to follow up with Ophthalmology for diabetic eye exam       Relevant Medications   Dulaglutide (TRULICITY) 1.5 [place]/7.0WC SOPN   Other Relevant Orders   POCT glycosylated hemoglobin (Hb A1C) (Completed)   Basic Metabolic Panel (BMET)   Lipid Profile   Hypothyroidism, unspecified    Lab Results  Component Value Date   TSH 7.26 (H) 09/23/2019   On Levothyrozine 125 mcg QD Check TSH and free T4      Relevant Orders   TSH + free T4     Other   Reactive depression    Well-controlled with Paxil         Meds ordered this encounter  Medications  . Dulaglutide (TRULICITY) 1.5 BJ/6.2GB SOPN    Sig: Inject 1.5 mg into the skin once a week.    Dispense:  3 mL    Refill:  3    Follow-up: Return in about 3 months (around 12/23/2020) for DM and hypothyroidism.    Lindell Spar, MD

## 2020-09-22 NOTE — Assessment & Plan Note (Signed)
HbA1C: 8.3 today in office, better from previous - 10.1 On Metformin and Trulicity, Increased dose of Trulicity to 1.5 mg Advised to follow diabetic diet On ACE and statin F/u CMP and lipid panel Diabetic eye exam: Advised to follow up with Ophthalmology for diabetic eye exam

## 2020-09-22 NOTE — Patient Instructions (Signed)
Please start taking Trulicity 1.5 mg instead of 0.75 mg as prescribed.  Please continue to follow low carbohydrate diet and perform moderate exercise at least 150 mins/week.  Please continue other medications as prescribed.

## 2020-09-22 NOTE — Assessment & Plan Note (Signed)
Well-controlled with Paxil 

## 2020-09-22 NOTE — Assessment & Plan Note (Signed)
Lab Results  Component Value Date   TSH 7.26 (H) 09/23/2019   On Levothyrozine 125 mcg QD Check TSH and free T4

## 2020-09-23 LAB — BASIC METABOLIC PANEL
BUN/Creatinine Ratio: 16 (ref 10–24)
BUN: 16 mg/dL (ref 8–27)
CO2: 26 mmol/L (ref 20–29)
Calcium: 9.3 mg/dL (ref 8.6–10.2)
Chloride: 98 mmol/L (ref 96–106)
Creatinine, Ser: 1.02 mg/dL (ref 0.76–1.27)
Glucose: 212 mg/dL — ABNORMAL HIGH (ref 65–99)
Potassium: 4.3 mmol/L (ref 3.5–5.2)
Sodium: 139 mmol/L (ref 134–144)
eGFR: 80 mL/min/{1.73_m2} (ref >59)

## 2020-09-23 LAB — TSH+FREE T4
Free T4: 1.4 ng/dL (ref 0.82–1.77)
TSH: 1.24 u[IU]/mL (ref 0.450–4.500)

## 2020-09-23 LAB — LIPID PANEL
Chol/HDL Ratio: 2.4 ratio (ref 0.0–5.0)
Cholesterol, Total: 113 mg/dL (ref 100–199)
HDL: 48 mg/dL (ref >39)
LDL Chol Calc (NIH): 49 mg/dL (ref 0–99)
Triglycerides: 78 mg/dL (ref 0–149)
VLDL Cholesterol Cal: 16 mg/dL (ref 5–40)

## 2020-10-01 ENCOUNTER — Other Ambulatory Visit: Payer: Self-pay | Admitting: Physician Assistant

## 2020-10-01 ENCOUNTER — Other Ambulatory Visit: Payer: Self-pay | Admitting: Family Medicine

## 2020-10-01 DIAGNOSIS — I10 Essential (primary) hypertension: Secondary | ICD-10-CM

## 2020-10-01 MED FILL — AMLODIPINE BESYLATE 10 MG T: 10 | 90 days supply | Qty: 90 | Fill #0

## 2020-10-01 MED FILL — ATORVASTATIN 10 MG TABLET: 10 | 90 days supply | Qty: 90 | Fill #0

## 2020-10-01 MED FILL — LEVOTHYROXINE SODIUM 125 MC: 125 | 90 days supply | Qty: 90 | Fill #1

## 2020-10-01 MED FILL — TRULICITY 1.5 MG/0.5 ML PEN: 1.5 | 84 days supply | Qty: 6 | Fill #0

## 2020-10-13 ENCOUNTER — Other Ambulatory Visit (HOSPITAL_BASED_OUTPATIENT_CLINIC_OR_DEPARTMENT_OTHER): Payer: Self-pay

## 2020-10-26 ENCOUNTER — Ambulatory Visit: Payer: 59

## 2020-10-28 ENCOUNTER — Other Ambulatory Visit (HOSPITAL_COMMUNITY): Payer: Self-pay

## 2020-11-02 ENCOUNTER — Telehealth: Payer: 59

## 2020-11-03 ENCOUNTER — Telehealth: Payer: 59

## 2020-11-09 ENCOUNTER — Other Ambulatory Visit (HOSPITAL_COMMUNITY): Payer: Self-pay

## 2020-11-09 MED FILL — Lisinopril Tab 40 MG: ORAL | 90 days supply | Qty: 90 | Fill #0 | Status: AC

## 2020-12-17 ENCOUNTER — Encounter: Payer: 59 | Admitting: Internal Medicine

## 2020-12-23 ENCOUNTER — Other Ambulatory Visit: Payer: Self-pay

## 2020-12-23 ENCOUNTER — Other Ambulatory Visit (HOSPITAL_COMMUNITY): Payer: Self-pay

## 2020-12-23 ENCOUNTER — Ambulatory Visit: Payer: 59 | Admitting: Internal Medicine

## 2020-12-23 ENCOUNTER — Encounter: Payer: Self-pay | Admitting: Internal Medicine

## 2020-12-23 VITALS — BP 142/88 | HR 133 | Temp 98.3°F | Resp 16 | Ht 72.0 in | Wt 208.0 lb

## 2020-12-23 DIAGNOSIS — Z1159 Encounter for screening for other viral diseases: Secondary | ICD-10-CM

## 2020-12-23 DIAGNOSIS — I10 Essential (primary) hypertension: Secondary | ICD-10-CM

## 2020-12-23 DIAGNOSIS — E1169 Type 2 diabetes mellitus with other specified complication: Secondary | ICD-10-CM | POA: Diagnosis not present

## 2020-12-23 DIAGNOSIS — E039 Hypothyroidism, unspecified: Secondary | ICD-10-CM | POA: Diagnosis not present

## 2020-12-23 DIAGNOSIS — Z125 Encounter for screening for malignant neoplasm of prostate: Secondary | ICD-10-CM

## 2020-12-23 DIAGNOSIS — E1165 Type 2 diabetes mellitus with hyperglycemia: Secondary | ICD-10-CM | POA: Diagnosis not present

## 2020-12-23 DIAGNOSIS — E669 Obesity, unspecified: Secondary | ICD-10-CM

## 2020-12-23 LAB — POCT GLYCOSYLATED HEMOGLOBIN (HGB A1C): HbA1c, POC (controlled diabetic range): 7.8 % — AB (ref 0.0–7.0)

## 2020-12-23 MED ORDER — HYDROCHLOROTHIAZIDE 12.5 MG PO CAPS
12.5000 mg | ORAL_CAPSULE | Freq: Every day | ORAL | 3 refills | Status: DC
  Filled 2020-12-23: qty 30, 30d supply, fill #0

## 2020-12-23 NOTE — Assessment & Plan Note (Addendum)
HbA1C: 7.8  today in office, better from previous - 8.3 On Metformin and Trulicity Advised to follow diabetic diet On ACE and statin F/u CMP and lipid panel Diabetic foot exam: Today Diabetic eye exam: Advised to follow up with Ophthalmology for diabetic eye exam

## 2020-12-23 NOTE — Assessment & Plan Note (Signed)
Lab Results  Component Value Date   TSH 1.240 09/22/2020   On Levothyrozine 125 mcg QD Check TSH and free T4

## 2020-12-23 NOTE — Progress Notes (Signed)
Established Patient Office Visit  Subjective:  Patient ID: Jim Salazar, male    DOB: [birth year]  Age: 70 y.o. MRN: [id number]  CC:  Chief Complaint  Patient presents with  . Follow-up    3 month follow up pt is nauseas this am took meds on empty stomach as he is fasting didn't know if you wanted blood work. Also feels like trulicity is causing him to be nauseated at times     [name] is a 70 y.o.malewith PMH as mentioned below who presents for for follow up for chronic conditions.  He has been feeling well overall.  HTN: BP is well-controlled. Takes medications regularly. Patient denies headache, dizziness, chest pain, dyspnea or palpitations.  DM: His HbA1C has improved to 7.8 from 8.3 now. He has been tolerating Trulicity 1.5 mg dose except intermittent nausea. Denies any polyuria or polydipsia. His blood glucose have been around 120s.  Hypothyroidism: He has been taking Levothyroxine 125 mcg QD. Denies any constipation, diarrhea, weight change recently, hair or nail changes.   Past Medical History:  Diagnosis Date  . CAD (coronary artery disease), native coronary artery    mild nonobstructive plaque in LCx and RCA and mildly obstructive minimally calcified plaque in mid LAD on coronary CTA 11/2017  . Depression   . Diabetes mellitus without complication ([place])   . Hypertension   . Hypothyroidism, unspecified   . Left sided numbness 09/17/2017  . Major depressive disorder, single episode, unspecified   . Need for immunization against influenza 06/12/2019  . Need for vaccination against Streptococcus pneumoniae using pneumococcal conjugate vaccine 13 06/12/2019  . Obesity, unspecified   . Right upper quadrant abdominal pain 09/22/2019  . Screening for prostate cancer 06/12/2019  . Syncope 10/17/2017  . Thyroid disease   . Tremor, essential 11/30/2017  . Type 2 diabetes mellitus with hyperglycemia [facility])     Past Surgical History:  Procedure Laterality  Date  . APPENDECTOMY    . TONSILLECTOMY      Family History  Problem Relation Age of Onset  . Hypertension Other   . Diabetes Mellitus II Mother   . Cancer Father        lung    Social History   Socioeconomic History  . Marital status: Married    Spouse name: [name]   . Number of children: 2  . Years of education: College  . Highest education level: Not on file  Occupational History  . Occupation: semi retired     Comment: Theme park manager   Tobacco Use  . Smoking status: Never Smoker  . Smokeless tobacco: Never Used  Vaping Use  . Vaping Use: Never used  Substance and Sexual Activity  . Alcohol use: No  . Drug use: No  . Sexual activity: Yes    Birth control/protection: None  Other Topics Concern  . Not on file  Social History Narrative   Lives with 2nd wife [name] -married 10 years      3 dogs: 2 inside and 1 outside: Skimp; Ginger; Eve      Enjoys: model railroad Museum/gallery curator, Chiropodist some      Diet: eats all food groups -raisins    Caffeine use: 1-2 cups decaf coffee daily, diet coke sometimes   Water: 4-5 bottles a day       Wears seat belt   Does not use phone while driving-hands free    Oceanographer at home   Weapons in lock box   Social Determinants  of Health   Financial Resource Strain: Low Risk   . Difficulty of Paying Living Expenses: Not hard at all  Food Insecurity: No Food Insecurity  . Worried About Charity fundraiser in the Last Year: Never true  . Ran Out of Food in the Last Year: Never true  Transportation Needs: No Transportation Needs  . Lack of Transportation (Medical): No  . Lack of Transportation (Non-Medical): No  Physical Activity: Sufficiently Active  . Days of Exercise per Week: 5 days  . Minutes of Exercise per Session: 30 min  Stress: No Stress Concern Present  . Feeling of Stress : Only a little  Social Connections: Unknown  . Frequency of Communication with Friends and Family: More than three times a week  . Frequency of Social  Gatherings with Friends and Family: Three times a week  . Attends Religious Services: More than 4 times per year  . Active Member of Clubs or Organizations: No  . Attends Archivist Meetings: Never  . Marital Status: Not on file  Intimate Partner Violence: Not At Risk  . Fear of Current or Ex-Partner: No  . Emotionally Abused: No  . Physically Abused: No  . Sexually Abused: No    Outpatient Medications Prior to Visit  Medication Sig Dispense Refill  . Accu-Chek FastClix Lancets MISC     . amLODipine (NORVASC) 10 MG tablet TAKE 1 TABLET BY MOUTH AT BEDTIME 90 tablet 1  . aspirin EC 81 MG tablet Take 81 mg by mouth every evening.    Marland Kitchen atorvastatin (LIPITOR) 10 MG tablet TAKE 1 TABLET BY MOUTH ONCE A DAY 90 tablet 3  . cetirizine (ZYRTEC) 10 MG tablet Take 1 tablet (10 mg total) by mouth daily. 30 tablet 0  . Dulaglutide 1.5 MG/0.5ML SOPN INJECT 1.5 MG INTO THE SKIN ONCE A WEEK. 2 mL 3  . fluticasone (FLONASE) 50 MCG/ACT nasal spray Place 2 sprays into both nostrils daily. 16 g 0  . FREESTYLE LITE test strip     . levothyroxine (SYNTHROID) 125 MCG tablet TAKE 1 TABLET BY MOUTH ONCE A DAY 90 tablet 1  . lisinopril (ZESTRIL) 40 MG tablet TAKE 1 TABLET BY MOUTH DAILY. 90 tablet 3  . metFORMIN (GLUCOPHAGE) 1000 MG tablet TAKE 1 TABLET BY MOUTH 2 TIMES DAILY WITH A MEAL. 180 tablet 1  . PARoxetine (PAXIL) 20 MG tablet TAKE 1 TABLET BY MOUTH DAILY. 90 tablet 1  . chlorthalidone (HYGROTON) 25 MG tablet Take 1 tablet (25 mg total) by mouth daily. 30 tablet 2  . dexlansoprazole (DEXILANT) 60 MG capsule Take 60 mg by mouth as needed. FOR REFLUX     No facility-administered medications prior to visit.    No Known Allergies  ROS Review of Systems  Constitutional: Negative for chills and fever.  HENT: Negative for congestion and sore throat.   Eyes: Negative for pain and discharge.  Respiratory: Negative for cough and shortness of breath.   Cardiovascular: Negative for chest pain  and palpitations.  Gastrointestinal: Negative for constipation, diarrhea and vomiting.  Endocrine: Negative for polydipsia and polyuria.  Genitourinary: Negative for dysuria and hematuria.  Musculoskeletal: Negative for neck pain and neck stiffness.  Skin: Negative for rash.  Neurological: Negative for dizziness, weakness, numbness and headaches.  Psychiatric/Behavioral: Negative for agitation and behavioral problems.      Objective:    Physical Exam Vitals reviewed.  Constitutional:      General: He is not in acute distress.    Appearance:  He is not diaphoretic.  HENT:     Head: Normocephalic and atraumatic.     Nose: Nose normal.     Mouth/Throat:     Mouth: Mucous membranes are moist.  Eyes:     General: No scleral icterus.    Extraocular Movements: Extraocular movements intact.     Pupils: Pupils are equal, round, and reactive to light.  Cardiovascular:     Rate and Rhythm: Normal rate and regular rhythm.     Pulses: Normal pulses.     Heart sounds: Normal heart sounds. No murmur heard.   Pulmonary:     Breath sounds: Normal breath sounds. No wheezing or rales.  Abdominal:     Palpations: Abdomen is soft.     Tenderness: There is no abdominal tenderness.  Musculoskeletal:     Cervical back: Neck supple. No tenderness.     Right lower leg: No edema.     Left lower leg: No edema.  Skin:    General: Skin is warm.     Findings: No rash.  Neurological:     General: No focal deficit present.     Mental Status: He is alert and oriented to person, place, and time.     Sensory: No sensory deficit.     Motor: No weakness.  Psychiatric:        Mood and Affect: Mood normal.        Behavior: Behavior normal.     BP (!) 142/88 (BP Location: Right Arm, Cuff Size: Normal)   Pulse (!) 133   Temp 98.3 F (36.8 C) (Oral)   Resp 16   Ht 6' (1.829 m)   Wt 208 lb (94.3 kg)   SpO2 97%   BMI 28.21 kg/m  Wt Readings from Last 3 Encounters:  12/23/20 208 lb (94.3 kg)   09/22/20 212 lb 1.9 oz (96.2 kg)  06/29/20 213 lb (96.6 kg)     Health Maintenance Due  Topic Date Due  . Hepatitis C Screening  Never done  . TETANUS/TDAP  Never done  . COLONOSCOPY (Pts 45-14yr Insurance coverage will need to be confirmed)  Never done  . Zoster Vaccines- Shingrix (1 of 2) Never done  . COVID-19 Vaccine (2 - Booster for [employer]) 12/12/2019  . OPHTHALMOLOGY EXAM  12/20/2019    There are no preventive care reminders to display for this patient.  Lab Results  Component Value Date   TSH 1.240 09/22/2020   Lab Results  Component Value Date   WBC 8.3 09/23/2019   HGB 14.8 09/23/2019   HCT 43.7 09/23/2019   MCV 86.9 09/23/2019   PLT 250 09/23/2019   Lab Results  Component Value Date   NA 139 09/22/2020   K 4.3 09/22/2020   CO2 26 09/22/2020   GLUCOSE 212 (H) 09/22/2020   BUN 16 09/22/2020   CREATININE 1.02 09/22/2020   BILITOT 1.3 (H) 06/22/2020   ALKPHOS 60 09/17/2017   AST 14 06/22/2020   ALT 12 06/22/2020   PROT 6.4 06/22/2020   ALBUMIN 3.7 09/17/2017   CALCIUM 9.3 09/22/2020   ANIONGAP 14 10/07/2017   EGFR 80 09/22/2020   Lab Results  Component Value Date   CHOL 113 09/22/2020   Lab Results  Component Value Date   HDL 48 09/22/2020   Lab Results  Component Value Date   LDLCALC 49 09/22/2020   Lab Results  Component Value Date   TRIG 78 09/22/2020   Lab Results  Component Value Date  CHOLHDL 2.4 09/22/2020   Lab Results  Component Value Date   HGBA1C 7.8 (A) 12/23/2020      Assessment & Plan:   Problem List Items Addressed This Visit      Cardiovascular and Mediastinum   Essential hypertension - Primary    BP Readings from Last 1 Encounters:  12/23/20 (!) 142/88   uncontrolled with Lisinopril and Amlodipine Remains elevated at home, especially in the morning Added HCTZ 12.5 mg QD Counseled for compliance with the medications Advised DASH diet and moderate exercise/walking, at least 150 mins/week        Relevant Medications   hydrochlorothiazide (MICROZIDE) 12.5 MG capsule   Other Relevant Orders   CBC     Endocrine   Diabetes mellitus type 2, uncontrolled ([place])    HbA1C: 7.8  today in office, better from previous - 8.3 On Metformin and Trulicity Advised to follow diabetic diet On ACE and statin F/u CMP and lipid panel Diabetic foot exam: Today Diabetic eye exam: Advised to follow up with Ophthalmology for diabetic eye exam       Relevant Medications   hydrochlorothiazide (MICROZIDE) 12.5 MG capsule   Hypothyroidism, unspecified    Lab Results  Component Value Date   TSH 1.240 09/22/2020   On Levothyrozine 125 mcg QD Check TSH and free T4      Relevant Orders   TSH + free T4    Other Visit Diagnoses    Screening for prostate cancer       Relevant Orders   PSA   Need for hepatitis C screening test       Relevant Orders   Hepatitis C Antibody      Meds ordered this encounter  Medications  . hydrochlorothiazide (MICROZIDE) 12.5 MG capsule    Sig: Take 1 capsule (12.5 mg total) by mouth daily.    Dispense:  30 capsule    Refill:  3    Follow-up: Return in about 4 months (around 04/24/2021) for Annual physical.    Lindell Spar, MD

## 2020-12-23 NOTE — Patient Instructions (Addendum)
Please start taking HCTZ 12.5 mg once daily.  Please continue taking other medications as prescribed.  Please continue to follow low carb and low salt diet and perform moderate exercise/walking at least 150 mins/week.  Please get fasting blood tests done before the next visit.  Please apply Lotrimin cream for fungal infection.

## 2020-12-23 NOTE — Assessment & Plan Note (Signed)
BP Readings from Last 1 Encounters:  12/23/20 (!) 142/88   uncontrolled with Lisinopril and Amlodipine Remains elevated at home, especially in the morning Added HCTZ 12.5 mg QD Counseled for compliance with the medications Advised DASH diet and moderate exercise/walking, at least 150 mins/week

## 2020-12-27 ENCOUNTER — Other Ambulatory Visit (HOSPITAL_COMMUNITY): Payer: Self-pay

## 2020-12-27 ENCOUNTER — Other Ambulatory Visit: Payer: Self-pay | Admitting: Internal Medicine

## 2020-12-27 DIAGNOSIS — E1169 Type 2 diabetes mellitus with other specified complication: Secondary | ICD-10-CM

## 2020-12-27 MED ORDER — TRULICITY 1.5 MG/0.5ML ~~LOC~~ SOAJ
1.5000 mg | SUBCUTANEOUS | 3 refills | Status: DC
  Filled 2020-12-27: qty 6, 84d supply, fill #0

## 2020-12-27 MED FILL — Dulaglutide Soln Auto-injector 1.5 MG/0.5ML: SUBCUTANEOUS | 28 days supply | Qty: 2 | Fill #0 | Status: CN

## 2020-12-27 MED FILL — Paroxetine HCl Tab 20 MG: ORAL | 90 days supply | Qty: 90 | Fill #0 | Status: AC

## 2021-01-06 ENCOUNTER — Other Ambulatory Visit: Payer: Self-pay | Admitting: Family Medicine

## 2021-01-06 ENCOUNTER — Other Ambulatory Visit (HOSPITAL_COMMUNITY): Payer: Self-pay

## 2021-01-06 DIAGNOSIS — E669 Obesity, unspecified: Secondary | ICD-10-CM

## 2021-01-06 DIAGNOSIS — E039 Hypothyroidism, unspecified: Secondary | ICD-10-CM

## 2021-01-06 MED ORDER — LEVOTHYROXINE SODIUM 125 MCG PO TABS
ORAL_TABLET | Freq: Every day | ORAL | 1 refills | Status: DC
  Filled 2021-01-06: qty 90, 90d supply, fill #0

## 2021-01-06 MED ORDER — METFORMIN HCL 1000 MG PO TABS
ORAL_TABLET | Freq: Two times a day (BID) | ORAL | 1 refills | Status: DC
  Filled 2021-01-06: qty 180, 90d supply, fill #0

## 2021-01-06 MED FILL — Atorvastatin Calcium Tab 10 MG (Base Equivalent): ORAL | 90 days supply | Qty: 90 | Fill #0 | Status: AC

## 2021-01-06 MED FILL — Amlodipine Besylate Tab 10 MG (Base Equivalent): ORAL | 90 days supply | Qty: 90 | Fill #0 | Status: AC

## 2021-01-10 ENCOUNTER — Other Ambulatory Visit: Payer: Self-pay

## 2021-01-10 ENCOUNTER — Ambulatory Visit (INDEPENDENT_AMBULATORY_CARE_PROVIDER_SITE_OTHER): Payer: 59

## 2021-01-10 DIAGNOSIS — Z0000 Encounter for general adult medical examination without abnormal findings: Secondary | ICD-10-CM

## 2021-01-10 NOTE — Progress Notes (Signed)
Subjective:   Jim Salazar is a 70 y.o. male who presents for Medicare Annual/Subsequent preventive examination.  I connected with Jim Salazar today by telephone and verified that I am speaking with the correct person using two identifiers. Location patient: home Location provider: work Persons participating in the virtual visit: patient, provider.   I discussed the limitations, risks, security and privacy concerns of performing an evaluation and management service by telephone and the availability of in person appointments. I also discussed with the patient that there may be a patient responsible charge related to this service. The patient expressed understanding and verbally consented to this telephonic visit.    Interactive audio and video telecommunications were attempted between this provider and patient, however failed, due to patient having technical difficulties OR patient did not have access to video capability.  We continued and completed visit with audio only.     Review of Systems    N/A  Cardiac Risk Factors include: advanced age (>62men, >55 women);diabetes mellitus;male gender;hypertension;dyslipidemia     Objective:    Today's Vitals   01/10/21 1421  PainSc: 0-No pain   There is no height or weight on file to calculate BMI.  Advanced Directives 01/10/2021 06/28/2020 10/07/2017 09/16/2017  Does Patient Have a Medical Advance Directive? No No No No  Would patient like information on creating a medical advance directive? No - Patient declined No - Patient declined - No - Patient declined    Current Medications (verified) Outpatient Encounter Medications as of 01/10/2021  Medication Sig   Accu-Chek FastClix Lancets MISC    amLODipine (NORVASC) 10 MG tablet TAKE 1 TABLET BY MOUTH AT BEDTIME   aspirin EC 81 MG tablet Take 81 mg by mouth every evening.   atorvastatin (LIPITOR) 10 MG tablet TAKE 1 TABLET BY MOUTH ONCE A DAY   cetirizine (ZYRTEC) 10 MG tablet Take 1  tablet (10 mg total) by mouth daily.   Dulaglutide (TRULICITY) 1.5 AT/5.5DD SOPN Inject 1.5 mg into the skin once a week.   fluticasone (FLONASE) 50 MCG/ACT nasal spray Place 2 sprays into both nostrils daily.   FREESTYLE LITE test strip    hydrochlorothiazide (MICROZIDE) 12.5 MG capsule Take 1 capsule (12.5 mg total) by mouth daily.   levothyroxine (SYNTHROID) 125 MCG tablet TAKE 1 TABLET BY MOUTH ONCE A DAY   lisinopril (ZESTRIL) 40 MG tablet TAKE 1 TABLET BY MOUTH DAILY.   metFORMIN (GLUCOPHAGE) 1000 MG tablet TAKE 1 TABLET BY MOUTH 2 TIMES DAILY WITH A MEAL.   PARoxetine (PAXIL) 20 MG tablet TAKE 1 TABLET BY MOUTH DAILY.   [DISCONTINUED] JANUMET 50-1000 MG tablet Take 1 tablet by mouth 2 (two) times daily.   No facility-administered encounter medications on file as of 01/10/2021.    Allergies (verified) Patient has no known allergies.   History: Past Medical History:  Diagnosis Date   CAD (coronary artery disease), native coronary artery    mild nonobstructive plaque in LCx and RCA and mildly obstructive minimally calcified plaque in mid LAD on coronary CTA 11/2017   Depression    Diabetes mellitus without complication ([place])    Hypertension    Hypothyroidism, unspecified    Left sided numbness 09/17/2017   Major depressive disorder, single episode, unspecified    Need for immunization against influenza 06/12/2019   Need for vaccination against Streptococcus pneumoniae using pneumococcal conjugate vaccine 13 06/12/2019   Obesity, unspecified    Right upper quadrant abdominal pain 09/22/2019   Screening for prostate cancer 06/12/2019  Syncope 10/17/2017   Thyroid disease    Tremor, essential 11/30/2017   Type 2 diabetes mellitus with hyperglycemia (HCC)    Past Surgical History:  Procedure Laterality Date   APPENDECTOMY     TONSILLECTOMY     Family History  Problem Relation Age of Onset   Hypertension Other    Diabetes Mellitus II Mother    Cancer Father        lung    Social History   Socioeconomic History   Marital status: Married    Spouse name: [name]    Number of children: 2   Years of education: College   Highest education level: Not on file  Occupational History   Occupation: semi retired     Comment: Theme park manager   Tobacco Use   Smoking status: Never   Smokeless tobacco: Never  Vaping Use   Vaping Use: Never used  Substance and Sexual Activity   Alcohol use: No   Drug use: No   Sexual activity: Yes    Birth control/protection: None  Other Topics Concern   Not on file  Social History Narrative   Lives with 2nd wife [name] -married 10 years      3 dogs: 2 inside and 1 outside: Skimp; Ginger; Eve      Enjoys: model railroad Museum/gallery curator, Chiropodist some      Diet: eats all food groups -raisins    Caffeine use: 1-2 cups decaf coffee daily, diet coke sometimes   Water: 4-5 bottles a day       Wears seat belt   Does not use phone while driving-hands free    Oceanographer at home   Weapons in lock box   Social Determinants of Radio broadcast assistant Strain: Low Risk    Difficulty of Paying Living Expenses: Not hard at all  Food Insecurity: No Food Insecurity   Worried About Charity fundraiser in the Last Year: Never true   Arboriculturist in the Last Year: Never true  Transportation Needs: No Transportation Needs   Lack of Transportation (Medical): No   Lack of Transportation (Non-Medical): No  Physical Activity: Sufficiently Active   Days of Exercise per Week: 6 days   Minutes of Exercise per Session: 40 min  Stress: No Stress Concern Present   Feeling of Stress : Not at all  Social Connections: Socially Integrated   Frequency of Communication with Friends and Family: More than three times a week   Frequency of Social Gatherings with Friends and Family: Three times a week   Attends Religious Services: More than 4 times per year   Active Member of Clubs or Organizations: Yes   Attends Music therapist: More than 4  times per year   Marital Status: Married    Tobacco Counseling Counseling given: Not Answered   Clinical Intake:  Pre-visit preparation completed: Yes  Pain : No/denies pain Pain Score: 0-No pain     Nutritional Risks: Nausea/ vomitting/ diarrhea (Nausea due to trulicity) Diabetes: Yes CBG done?: No Did pt. bring in CBG monitor from home?: No  How often do you need to have someone help you when you read instructions, pamphlets, or other written materials from your doctor or pharmacy?: 1 - Never  Diabetic?Yes Nutrition Risk Assessment:  Has the patient had any N/V/D within the last 2 months?  Yes  Does the patient have any non-healing wounds?  No  Has the patient had any unintentional weight loss or weight  gain?  No   Diabetes:  Is the patient diabetic?  Yes  If diabetic, was a CBG obtained today?  No  Did the patient bring in their glucometer from home?  No  How often do you monitor your CBG's? Patient states checks glucose 2-3x per day .   Financial Strains and Diabetes Management:  Are you having any financial strains with the device, your supplies or your medication? No .  Does the patient want to be seen by Chronic Care Management for management of their diabetes?  No  Would the patient like to be referred to a Nutritionist or for Diabetic Management?  No   Diabetic Exams:  Diabetic Eye Exam: Overdue for diabetic eye exam. Pt has been advised about the importance in completing this exam. Patient advised to call and schedule an eye exam. Diabetic Foot Exam: Completed 12/23/2020   Interpreter Needed?: No  Information entered by :: [place] of Daily Living In your present state of health, do you have any difficulty performing the following activities: 01/10/2021 06/28/2020  Hearing? N N  Vision? N N  Difficulty concentrating or making decisions? N N  Walking or climbing stairs? N N  Dressing or bathing? N N  Doing errands, shopping? N N   Preparing Food and eating ? N N  Using the Toilet? N N  In the past six months, have you accidently leaked urine? N N  Do you have problems with loss of bowel control? N N  Managing your Medications? N N  Managing your Finances? N N  Housekeeping or managing your Housekeeping? N N  Some recent data might be hidden    Patient Care Team: Perlie Mayo, NP as PCP - General (Family Medicine) Sueanne Margarita, MD as PCP - Cardiology (Cardiology)  Indicate any recent Medical Services you may have received from other than [facility] providers in the past year (date may be approximate).     Assessment:   This is a routine wellness examination for [place].  Hearing/Vision screen Vision Screening - Comments:: Patient stated gets eyes examined once per year. Currently wears glasses   Dietary issues and exercise activities discussed: Current Exercise Habits: Home exercise routine, Type of exercise: treadmill, Time (Minutes): 45, Frequency (Times/Week): 6, Weekly Exercise (Minutes/Week): 270, Intensity: Moderate   Goals Addressed             This Visit's Progress    Exercise 150 min/wk Moderate Activity       HEMOGLOBIN A1C < 7         Depression Screen PHQ 2/9 Scores 01/10/2021 12/23/2020 09/22/2020 06/28/2020 06/28/2020 06/24/2020 04/29/2020  PHQ - 2 Score 0 0 0 0 0 0 0  PHQ- 9 Score 0 0 - 0 0 0 0  Exception Documentation - - - - - - -    Fall Risk Fall Risk  01/10/2021 12/23/2020 09/22/2020 06/28/2020 06/24/2020  Falls in the past year? 0 0 0 0 0  Number falls in past yr: 0 0 0 0 0  Injury with Fall? 0 0 0 0 0  Risk for fall due to : No Fall Risks No Fall Risks No Fall Risks No Fall Risks No Fall Risks  Follow up Falls evaluation completed;Falls prevention discussed Falls evaluation completed Falls evaluation completed Falls evaluation completed Falls evaluation completed    FALL RISK PREVENTION PERTAINING TO THE HOME:  Any stairs in or around the home? No  If so, are there any without  handrails?  No  Home free of loose throw rugs in walkways, pet beds, electrical cords, etc? Yes  Adequate lighting in your home to reduce risk of falls? Yes   ASSISTIVE DEVICES UTILIZED TO PREVENT FALLS:  Life alert? No  Use of a cane, walker or w/c? No  Grab bars in the bathroom? Yes  Shower chair or bench in shower? No  Elevated toilet seat or a handicapped toilet? Yes   Cognitive Function:  Normal cognitive status assessed by direct observation by this Nurse Health Advisor. No abnormalities found.     6CIT Screen 06/28/2020  What Year? 0 points  What month? 0 points  What time? 0 points  Count back from 20 0 points  Months in reverse 0 points  Repeat phrase 0 points  Total Score 0    Immunizations Immunization History  Administered Date(s) Administered   Fluad Quad(high Dose 65+) 04/29/2020   Influenza Inj Mdck Quad Pf 05/23/2016   Influenza Inj Mdck Quad With Preservative 05/24/2017   Influenza, High Dose Seasonal PF 05/25/2018   Influenza,inj,Quad PF,6+ Mos 06/12/2019   Influenza-Unspecified 08/04/2012, 04/22/2013, 05/24/2017   Janssen (J&J) SARS-COV-2 Vaccination 10/17/2019   Pneumococcal Conjugate-13 06/12/2019   Pneumococcal Polysaccharide-23 07/20/2017    TDAP status: Due, Education has been provided regarding the importance of this vaccine. Advised may receive this vaccine at local pharmacy or Health Dept. Aware to provide a copy of the vaccination record if obtained from local pharmacy or Health Dept. Verbalized acceptance and understanding.  Flu Vaccine status: Up to date  Pneumococcal vaccine status: Up to date  Covid-19 vaccine status: Completed vaccines  Qualifies for Shingles Vaccine? Yes   Zostavax completed No   Shingrix Completed?: No.    Education has been provided regarding the importance of this vaccine. Patient has been advised to call insurance company to determine out of pocket expense if they have not yet received this vaccine. Advised may  also receive vaccine at local pharmacy or Health Dept. Verbalized acceptance and understanding.  Screening Tests Health Maintenance  Topic Date Due   Hepatitis C Screening  Never done   TETANUS/TDAP  Never done   COLONOSCOPY (Pts 45-68yrs Insurance coverage will need to be confirmed)  Never done   Zoster Vaccines- Shingrix (1 of 2) Never done   COVID-19 Vaccine (2 - Booster for [employer] series) 12/12/2019   OPHTHALMOLOGY EXAM  12/20/2019   INFLUENZA VACCINE  02/21/2021   HEMOGLOBIN A1C  06/24/2021   FOOT EXAM  12/23/2021   PNA vac Low Risk Adult (2 of 2 - PPSV23) 07/20/2022   HPV VACCINES  Aged Out    Health Maintenance  Health Maintenance Due  Topic Date Due   Hepatitis C Screening  Never done   TETANUS/TDAP  Never done   COLONOSCOPY (Pts 45-99yrs Insurance coverage will need to be confirmed)  Never done   Zoster Vaccines- Shingrix (1 of 2) Never done   COVID-19 Vaccine (2 - Booster for Janssen series) 12/12/2019   OPHTHALMOLOGY EXAM  12/20/2019    Colorectal cancer screening: Referral to GI placed 01/10/2021. Pt aware the office will call re: appt.  Lung Cancer Screening: (Low Dose CT Chest recommended if Age 50-80 years, 30 pack-year currently smoking OR have quit w/in 15years.) does not qualify.   Lung Cancer Screening Referral: N/A   Additional Screening:  Hepatitis C Screening: does qualify;   Vision Screening: Recommended annual ophthalmology exams for early detection of glaucoma and other disorders of the eye. Is the patient up to  date with their annual eye exam?  Yes  Who is the provider or what is the name of the office in which the patient attends annual eye exams? Dr.Cotter If pt is not established with a provider, would they like to be referred to a provider to establish care? No .   Dental Screening: Recommended annual dental exams for proper oral hygiene  Community Resource Referral / Chronic Care Management: CRR required this visit?  No   CCM required  this visit?  No      Plan:     I have personally reviewed and noted the following in the patient's chart:   Medical and social history Use of alcohol, tobacco or illicit drugs  Current medications and supplements including opioid prescriptions. Patient is not currently taking opioid prescriptions. Functional ability and status Nutritional status Physical activity Advanced directives List of other physicians Hospitalizations, surgeries, and ER visits in previous 12 months Vitals Screenings to include cognitive, depression, and falls Referrals and appointments  In addition, I have reviewed and discussed with patient certain preventive protocols, quality metrics, and best practice recommendations. A written personalized care plan for preventive services as well as general preventive health recommendations were provided to patient.     Ofilia Neas, LPN   [id number]/[id number]   Nurse Notes: None

## 2021-01-10 NOTE — Patient Instructions (Signed)
Jim Salazar , Thank you for taking time to come for your Medicare Wellness Visit. I appreciate your ongoing commitment to your health goals. Please review the following plan we discussed and let me know if I can assist you in the future.   Screening recommendations/referrals: Colonoscopy: Currently due, please keep appointment scheduled for 03/02/2021 Recommended yearly ophthalmology/optometry visit for glaucoma screening and checkup Recommended yearly dental visit for hygiene and checkup  Vaccinations: Influenza vaccine: Up to date, next due fall 2022  Pneumococcal vaccine: Completed series  Tdap vaccine: Currently due, you may await and injury to receive  Shingles vaccine: Currently due for Shingrix, if you would like to receive we recommend that you do so at your local pharmacy as it is less expensive     Advanced directives: Advance directive discussed with you today. Even though you declined this today please call our office should you change your mind and we can give you the proper paperwork for you to fill out.   Conditions/risks identified: None   Next appointment: None   Preventive Care 65 Years and Older, Male Preventive care refers to lifestyle choices and visits with your health care provider that can promote health and wellness. What does preventive care include? A yearly physical exam. This is also called an annual well check. Dental exams once or twice a year. Routine eye exams. Ask your health care provider how often you should have your eyes checked. Personal lifestyle choices, including: Daily care of your teeth and gums. Regular physical activity. Eating a healthy diet. Avoiding tobacco and drug use. Limiting alcohol use. Practicing safe sex. Taking low doses of aspirin every day. Taking vitamin and mineral supplements as recommended by your health care provider. What happens during an annual well check? The services and screenings done by your health care  provider during your annual well check will depend on your age, overall health, lifestyle risk factors, and family history of disease. Counseling  Your health care provider may ask you questions about your: Alcohol use. Tobacco use. Drug use. Emotional well-being. Home and relationship well-being. Sexual activity. Eating habits. History of falls. Memory and ability to understand (cognition). Work and work Statistician. Screening  You may have the following tests or measurements: Height, weight, and BMI. Blood pressure. Lipid and cholesterol levels. These may be checked every 5 years, or more frequently if you are over 45 years old. Skin check. Lung cancer screening. You may have this screening every year starting at age 41 if you have a 30-pack-year history of smoking and currently smoke or have quit within the past 15 years. Fecal occult blood test (FOBT) of the stool. You may have this test every year starting at age 48. Flexible sigmoidoscopy or colonoscopy. You may have a sigmoidoscopy every 5 years or a colonoscopy every 10 years starting at age 13. Prostate cancer screening. Recommendations will vary depending on your family history and other risks. Hepatitis C blood test. Hepatitis B blood test. Sexually transmitted disease (STD) testing. Diabetes screening. This is done by checking your blood sugar (glucose) after you have not eaten for a while (fasting). You may have this done every 1-3 years. Abdominal aortic aneurysm (AAA) screening. You may need this if you are a current or former smoker. Osteoporosis. You may be screened starting at age 50 if you are at high risk. Talk with your health care provider about your test results, treatment options, and if necessary, the need for more tests. Vaccines  Your health care provider  may recommend certain vaccines, such as: Influenza vaccine. This is recommended every year. Tetanus, diphtheria, and acellular pertussis (Tdap, Td)  vaccine. You may need a Td booster every 10 years. Zoster vaccine. You may need this after age 68. Pneumococcal 13-valent conjugate (PCV13) vaccine. One dose is recommended after age 53. Pneumococcal polysaccharide (PPSV23) vaccine. One dose is recommended after age 41. Talk to your health care provider about which screenings and vaccines you need and how often you need them. This information is not intended to replace advice given to you by your health care provider. Make sure you discuss any questions you have with your health care provider. Document Released: 08/06/2015 Document Revised: 03/29/2016 Document Reviewed: 05/11/2015 Elsevier Interactive Patient Education  2017 [place] Prevention in the Home Falls can cause injuries. They can happen to people of all ages. There are many things you can do to make your home safe and to help prevent falls. What can I do on the outside of my home? Regularly fix the edges of walkways and driveways and fix any cracks. Remove anything that might make you trip as you walk through a door, such as a raised step or threshold. Trim any bushes or trees on the path to your home. Use bright outdoor lighting. Clear any walking paths of anything that might make someone trip, such as rocks or tools. Regularly check to see if handrails are loose or broken. Make sure that both sides of any steps have handrails. Any raised decks and porches should have guardrails on the edges. Have any leaves, snow, or ice cleared regularly. Use sand or salt on walking paths during winter. Clean up any spills in your garage right away. This includes oil or grease spills. What can I do in the bathroom? Use night lights. Install grab bars by the toilet and in the tub and shower. Do not use towel bars as grab bars. Use non-skid mats or decals in the tub or shower. If you need to sit down in the shower, use a plastic, non-slip stool. Keep the floor dry. Clean up any  water that spills on the floor as soon as it happens. Remove soap buildup in the tub or shower regularly. Attach bath mats securely with double-sided non-slip rug tape. Do not have throw rugs and other things on the floor that can make you trip. What can I do in the bedroom? Use night lights. Make sure that you have a light by your bed that is easy to reach. Do not use any sheets or blankets that are too big for your bed. They should not hang down onto the floor. Have a firm chair that has side arms. You can use this for support while you get dressed. Do not have throw rugs and other things on the floor that can make you trip. What can I do in the kitchen? Clean up any spills right away. Avoid walking on wet floors. Keep items that you use a lot in easy-to-reach places. If you need to reach something above you, use a strong step stool that has a grab bar. Keep electrical cords out of the way. Do not use floor polish or wax that makes floors slippery. If you must use wax, use non-skid floor wax. Do not have throw rugs and other things on the floor that can make you trip. What can I do with my stairs? Do not leave any items on the stairs. Make sure that there are handrails on both sides  of the stairs and use them. Fix handrails that are broken or loose. Make sure that handrails are as long as the stairways. Check any carpeting to make sure that it is firmly attached to the stairs. Fix any carpet that is loose or worn. Avoid having throw rugs at the top or bottom of the stairs. If you do have throw rugs, attach them to the floor with carpet tape. Make sure that you have a light switch at the top of the stairs and the bottom of the stairs. If you do not have them, ask someone to add them for you. What else can I do to help prevent falls? Wear shoes that: Do not have high heels. Have rubber bottoms. Are comfortable and fit you well. Are closed at the toe. Do not wear sandals. If you use a  stepladder: Make sure that it is fully opened. Do not climb a closed stepladder. Make sure that both sides of the stepladder are locked into place. Ask someone to hold it for you, if possible. Clearly mark and make sure that you can see: Any grab bars or handrails. First and last steps. Where the edge of each step is. Use tools that help you move around (mobility aids) if they are needed. These include: Canes. Walkers. Scooters. Crutches. Turn on the lights when you go into a dark area. Replace any light bulbs as soon as they burn out. Set up your furniture so you have a clear path. Avoid moving your furniture around. If any of your floors are uneven, fix them. If there are any pets around you, be aware of where they are. Review your medicines with your doctor. Some medicines can make you feel dizzy. This can increase your chance of falling. Ask your doctor what other things that you can do to help prevent falls. This information is not intended to replace advice given to you by your health care provider. Make sure you discuss any questions you have with your health care provider. Document Released: 05/06/2009 Document Revised: 12/16/2015 Document Reviewed: 08/14/2014 Elsevier Interactive Patient Education  2017 [employer].

## 2021-01-20 ENCOUNTER — Other Ambulatory Visit (HOSPITAL_COMMUNITY): Payer: Self-pay

## 2021-01-26 ENCOUNTER — Other Ambulatory Visit: Payer: Self-pay | Admitting: *Deleted

## 2021-01-26 DIAGNOSIS — I10 Essential (primary) hypertension: Secondary | ICD-10-CM

## 2021-01-26 MED ORDER — AMLODIPINE BESYLATE 10 MG PO TABS: ORAL_TABLET | Freq: Every day | ORAL | 1 refills | Status: DC

## 2021-01-26 MED ORDER — PAROXETINE HCL 20 MG PO TABS: ORAL_TABLET | Freq: Every day | ORAL | 1 refills | Status: DC

## 2021-01-28 ENCOUNTER — Other Ambulatory Visit (HOSPITAL_COMMUNITY): Payer: Self-pay

## 2021-02-08 ENCOUNTER — Other Ambulatory Visit (HOSPITAL_COMMUNITY): Payer: Self-pay

## 2021-02-12 ENCOUNTER — Other Ambulatory Visit (HOSPITAL_COMMUNITY): Payer: Self-pay

## 2021-02-16 ENCOUNTER — Other Ambulatory Visit: Payer: Self-pay

## 2021-02-16 ENCOUNTER — Ambulatory Visit (AMBULATORY_SURGERY_CENTER): Payer: Self-pay

## 2021-02-16 VITALS — Ht 72.0 in | Wt 208.0 lb

## 2021-02-16 DIAGNOSIS — Z1211 Encounter for screening for malignant neoplasm of colon: Secondary | ICD-10-CM

## 2021-02-16 MED ORDER — NA SULFATE-K SULFATE-MG SULF 17.5-3.13-1.6 GM/177ML PO SOLN
1.0000 | Freq: Once | ORAL | 0 refills | Status: AC
Start: 2021-02-16 — End: 2021-02-16

## 2021-02-16 NOTE — Progress Notes (Signed)
No allergies to soy or egg Pt is not on blood thinners or diet pills Denies issues with sedation/intubation Denies atrial flutter/fib Denies constipation   Emmi instructions given to pt  Pt is aware of Covid safety and care partner requirements.  

## 2021-02-17 ENCOUNTER — Other Ambulatory Visit (HOSPITAL_COMMUNITY): Payer: Self-pay

## 2021-02-28 ENCOUNTER — Telehealth: Payer: Self-pay | Admitting: Internal Medicine

## 2021-02-28 NOTE — Telephone Encounter (Signed)
Pt broke a tooth last week and had to have oral surgery, states he had a lot of bleeding. Reports there is still an open area in his mouth. He also states he has been vomiting and his grandson had a stomach virus. Pts procedure rescheduled to 03/09/21 at 9am. Pt aware of appt.

## 2021-03-02 ENCOUNTER — Encounter: Payer: 59 | Admitting: Internal Medicine

## 2021-03-09 ENCOUNTER — Ambulatory Visit (AMBULATORY_SURGERY_CENTER): Payer: HMO | Admitting: Internal Medicine

## 2021-03-09 ENCOUNTER — Other Ambulatory Visit: Payer: Self-pay

## 2021-03-09 ENCOUNTER — Encounter: Payer: Self-pay | Admitting: Internal Medicine

## 2021-03-09 VITALS — BP 168/95 | HR 83 | Temp 96.8°F | Resp 20 | Ht 72.0 in | Wt 208.0 lb

## 2021-03-09 DIAGNOSIS — I2510 Atherosclerotic heart disease of native coronary artery without angina pectoris: Secondary | ICD-10-CM | POA: Diagnosis not present

## 2021-03-09 DIAGNOSIS — E039 Hypothyroidism, unspecified: Secondary | ICD-10-CM | POA: Diagnosis not present

## 2021-03-09 DIAGNOSIS — Z1211 Encounter for screening for malignant neoplasm of colon: Secondary | ICD-10-CM | POA: Diagnosis not present

## 2021-03-09 DIAGNOSIS — F329 Major depressive disorder, single episode, unspecified: Secondary | ICD-10-CM | POA: Diagnosis not present

## 2021-03-09 DIAGNOSIS — Z538 Procedure and treatment not carried out for other reasons: Secondary | ICD-10-CM

## 2021-03-09 MED ORDER — SODIUM CHLORIDE 0.9 % IV SOLN: 500.0000 mL | Freq: Once | INTRAVENOUS | Status: DC

## 2021-03-09 NOTE — Op Note (Signed)
[place] Patient Name: Jim Salazar Procedure Date: 03/09/2021 8:45 AM MRN: [id number] Endoscopist: Jerene Bears , MD Age: 70 Referring MD:  Date of Birth: [birth year] Gender: Male Account #: [id number] Procedure:                Colonoscopy Indications:              Screening for colorectal malignant neoplasm, This                            is the patient's first colonoscopy Medicines:                Monitored Anesthesia Care Procedure:                Pre-Anesthesia Assessment:                           - Prior to the procedure, a History and Physical                            was performed, and patient medications and                            allergies were reviewed. The patient's tolerance of                            previous anesthesia was also reviewed. The risks                            and benefits of the procedure and the sedation                            options and risks were discussed with the patient.                            All questions were answered, and informed consent                            was obtained. Prior Anticoagulants: The patient has                            taken no previous anticoagulant or antiplatelet                            agents. ASA Grade Assessment: III - A patient with                            severe systemic disease. After reviewing the risks                            and benefits, the patient was deemed in                            satisfactory condition to undergo the procedure.  After obtaining informed consent, the colonoscope                            was passed under direct vision. Throughout the                            procedure, the patient's blood pressure, pulse, and                            oxygen saturations were monitored continuously. The                            CF [id number] was introduced through the anus                            with the intention of  advancing to the cecum. The                            scope was advanced to the descending colon before                            the procedure was aborted. Medications were given.                            The colonoscopy was performed without difficulty.                            The patient tolerated the procedure well. The                            quality of the bowel preparation was poor. The                            rectum was photographed. Scope In: 8:53:36 AM Scope Out: [id number] AM Total Procedure Duration: 0 hours 3 minutes 18 seconds  Findings:                 The digital rectal exam was normal.                           A large amount of semi-liquid stool was found in                            the rectum, in the sigmoid colon and in the                            descending colon, precluding visualization. Complications:            No immediate complications. Estimated Blood Loss:     Estimated blood loss: none. Impression:               - Preparation of the colon was poor.                           - Stool in  the rectum, in the sigmoid colon and in                            the descending colon.                           - Colonoscopy aborted.                           - No specimens collected. Recommendation:           - Patient has a contact number available for                            emergencies. The signs and symptoms of potential                            delayed complications were discussed with the                            patient. Return to normal activities tomorrow.                            Written discharge instructions were provided to the                            patient.                           - Clear liquid diet.                           - Continue present medications.                           - Repeat colonoscopy because the bowel preparation                            was suboptimal. Option for colonoscopy tomorrow                             with another provider versus returning at late date                            after 2-day prep. Jerene Bears, MD 03/09/2021 9:02:38 AM This report has been signed electronically.

## 2021-03-09 NOTE — Progress Notes (Signed)
GASTROENTEROLOGY PROCEDURE H&P NOTE   Primary Care Physician: Noreene Larsson, NP    Reason for Procedure:  Colon cancer screening  Plan:    Colonoscopy  Patient is appropriate for endoscopic procedure(s) in the ambulatory ([place]) setting.  The nature of the procedure, as well as the risks, benefits, and alternatives were carefully and thoroughly reviewed with the patient. Ample time for discussion and questions allowed. The patient understood, was satisfied, and agreed to proceed.     HPI: Jim Salazar is a 70 y.o. male who presents for screening colonoscopy.  History of hypertension, diabetes, CAD, hypothyroidism.  No prior colonoscopy.  No GI complaints.  No cardiovascular complaints including chest pain or shortness of breath.  Past Medical History:  Diagnosis Date   CAD (coronary artery disease), native coronary artery    mild nonobstructive plaque in LCx and RCA and mildly obstructive minimally calcified plaque in mid LAD on coronary CTA 11/2017   Depression    Diabetes mellitus without complication (HCC)    GERD (gastroesophageal reflux disease)    Hypertension    Hypothyroidism, unspecified    Left sided numbness 09/17/2017   Major depressive disorder, single episode, unspecified    Need for immunization against influenza 06/12/2019   Need for vaccination against Streptococcus pneumoniae using pneumococcal conjugate vaccine 13 06/12/2019   Obesity, unspecified    Right upper quadrant abdominal pain 09/22/2019   Screening for prostate cancer 06/12/2019   Syncope 10/17/2017   Thyroid disease    Tremor, essential 11/30/2017   Type 2 diabetes mellitus with hyperglycemia ([place])     Past Surgical History:  Procedure Laterality Date   APPENDECTOMY     TONSILLECTOMY      Prior to Admission medications   Medication Sig Start Date End Date Taking? Authorizing Provider  Accu-Chek FastClix Lancets MISC  07/23/19  Yes [provider]  amLODipine (NORVASC) 10  MG tablet TAKE 1 TABLET BY MOUTH AT BEDTIME 01/26/21 01/26/22 Yes Noreene Larsson, NP  aspirin EC 81 MG tablet Take 81 mg by mouth every evening.   Yes [provider]  atorvastatin (LIPITOR) 10 MG tablet TAKE 1 TABLET BY MOUTH ONCE A DAY 10/01/20 10/01/21 Yes Imogene Burn, PA-C  cetirizine (ZYRTEC) 10 MG tablet Take 1 tablet (10 mg total) by mouth daily. 01/13/20  Yes [place], [place], PA-C  Dulaglutide (TRULICITY) 1.5 0000000 SOPN Inject 1.5 mg into the skin once a week. 12/27/20 12/27/21 Yes Patel, Colin Broach, MD  fluticasone (FLONASE) 50 MCG/ACT nasal spray Place 2 sprays into both nostrils daily. 01/13/20  Yes [place], [place], PA-C  FREESTYLE LITE test strip  10/29/19  Yes [provider]  levothyroxine (SYNTHROID) 125 MCG tablet TAKE 1 TABLET BY MOUTH ONCE A DAY 01/06/21 01/06/22 Yes Perlie Mayo, NP  lisinopril (ZESTRIL) 40 MG tablet TAKE 1 TABLET BY MOUTH DAILY. 08/04/20 08/04/21 Yes Turner, Eber Hong, MD  metFORMIN (GLUCOPHAGE) 1000 MG tablet TAKE 1 TABLET BY MOUTH 2 TIMES DAILY WITH A MEAL. 01/06/21 01/06/22 Yes Perlie Mayo, NP  PARoxetine (PAXIL) 20 MG tablet TAKE 1 TABLET BY MOUTH DAILY. 01/26/21 01/26/22 Yes Noreene Larsson, NP  penicillin v potassium (VEETID) 500 MG tablet Take 500 mg by mouth 3 (three) times daily. 02/09/21  Yes [provider]  hydrochlorothiazide (MICROZIDE) 12.5 MG capsule Take 1 capsule (12.5 mg total) by mouth daily. Patient not taking: No sig reported 12/23/20   Lindell Spar, MD  JANUMET 50-1000 MG tablet Take 1 tablet  by mouth 2 (two) times daily. 04/26/20 06/24/20  Perlie Mayo, NP    Current Outpatient Medications  Medication Sig Dispense Refill   Accu-Chek FastClix Lancets MISC      amLODipine (NORVASC) 10 MG tablet TAKE 1 TABLET BY MOUTH AT BEDTIME 90 tablet 1   aspirin EC 81 MG tablet Take 81 mg by mouth every evening.     atorvastatin (LIPITOR) 10 MG tablet TAKE 1 TABLET BY MOUTH ONCE A DAY 90 tablet 3   cetirizine (ZYRTEC) 10 MG tablet  Take 1 tablet (10 mg total) by mouth daily. 30 tablet 0   Dulaglutide (TRULICITY) 1.5 0000000 SOPN Inject 1.5 mg into the skin once a week. 2 mL 3   fluticasone (FLONASE) 50 MCG/ACT nasal spray Place 2 sprays into both nostrils daily. 16 g 0   FREESTYLE LITE test strip      levothyroxine (SYNTHROID) 125 MCG tablet TAKE 1 TABLET BY MOUTH ONCE A DAY 90 tablet 1   lisinopril (ZESTRIL) 40 MG tablet TAKE 1 TABLET BY MOUTH DAILY. 90 tablet 3   metFORMIN (GLUCOPHAGE) 1000 MG tablet TAKE 1 TABLET BY MOUTH 2 TIMES DAILY WITH A MEAL. 180 tablet 1   PARoxetine (PAXIL) 20 MG tablet TAKE 1 TABLET BY MOUTH DAILY. 90 tablet 1   penicillin v potassium (VEETID) 500 MG tablet Take 500 mg by mouth 3 (three) times daily.     hydrochlorothiazide (MICROZIDE) 12.5 MG capsule Take 1 capsule (12.5 mg total) by mouth daily. (Patient not taking: No sig reported) 30 capsule 3   Current Facility-Administered Medications  Medication Dose Route Frequency Provider Last Rate Last Admin   0.9 %  sodium chloride infusion  500 mL Intravenous Once Arbadella Kimbler, Lajuan Lines, MD        Allergies as of 03/09/2021   (No Known Allergies)    Family History  Problem Relation Age of Onset   Diabetes Mellitus II Mother    Cancer Father        lung   Hypertension Other    Colon cancer Neg Hx    Colon polyps Neg Hx    Esophageal cancer Neg Hx    Rectal cancer Neg Hx    Stomach cancer Neg Hx     Social History   Socioeconomic History   Marital status: Married    Spouse name: [name]    Number of children: 2   Years of education: College   Highest education level: Not on file  Occupational History   Occupation: semi retired     Comment: Theme park manager   Tobacco Use   Smoking status: Never   Smokeless tobacco: Never  Vaping Use   Vaping Use: Never used  Substance and Sexual Activity   Alcohol use: No   Drug use: No   Sexual activity: Yes    Birth control/protection: None  Other Topics Concern   Not on file  Social History Narrative    Lives with 2nd wife [name] -married 10 years      3 dogs: 2 inside and 1 outside: Skimp; Ginger; Eve      Enjoys: model railroad Museum/gallery curator, Chiropodist some      Diet: eats all food groups -raisins    Caffeine use: 1-2 cups decaf coffee daily, diet coke sometimes   Water: 4-5 bottles a day       Wears seat belt   Does not use phone while driving-hands free    Smoke detectors at home   Weapons in lock box  Social Determinants of Health   Financial Resource Strain: Low Risk    Difficulty of Paying Living Expenses: Not hard at all  Food Insecurity: No Food Insecurity   Worried About Charity fundraiser in the Last Year: Never true   [place] in the Last Year: Never true  Transportation Needs: No Transportation Needs   Lack of Transportation (Medical): No   Lack of Transportation (Non-Medical): No  Physical Activity: Sufficiently Active   Days of Exercise per Week: 6 days   Minutes of Exercise per Session: 40 min  Stress: No Stress Concern Present   Feeling of Stress : Not at all  Social Connections: Socially Integrated   Frequency of Communication with Friends and Family: More than three times a week   Frequency of Social Gatherings with Friends and Family: Three times a week   Attends Religious Services: More than 4 times per year   Active Member of Clubs or Organizations: Yes   Attends Music therapist: More than 4 times per year   Marital Status: Married  Human resources officer Violence: Not At Risk   Fear of Current or Ex-Partner: No   Emotionally Abused: No   Physically Abused: No   Sexually Abused: No    Physical Exam: Vital signs in last 24 hours: '@BP'$  112/67   Pulse 80   Temp (!) 96.8 F (36 C)   Ht 6' (1.829 m)   Wt 208 lb (94.3 kg)   SpO2 98%   BMI 28.21 kg/m  GEN: NAD EYE: Sclerae anicteric ENT: MMM CV: Non-tachycardic Pulm: CTA b/l GI: Soft, NT/ND NEURO:  Alert & Oriented x 3   Zenovia Jarred, MD [facility]  03/09/2021 8:45  AM

## 2021-03-09 NOTE — Patient Instructions (Signed)
Repeat colonoscopy scheduled for Aug 29 with a 2 day prep.  Refer to prep instructions given to you today.   YOU HAD AN ENDOSCOPIC PROCEDURE TODAY AT [facility]:   Refer to the procedure report that was given to you for any specific questions about what was found during the examination.  If the procedure report does not answer your questions, please call your gastroenterologist to clarify.  If you requested that your care partner not be given the details of your procedure findings, then the procedure report has been included in a sealed envelope for you to review at your convenience later.  YOU SHOULD EXPECT: Some feelings of bloating in the abdomen. Passage of more gas than usual.  Walking can help get rid of the air that was put into your GI tract during the procedure and reduce the bloating. If you had a lower endoscopy (such as a colonoscopy or flexible sigmoidoscopy) you may notice spotting of blood in your stool or on the toilet paper. If you underwent a bowel prep for your procedure, you may not have a normal bowel movement for a few days.  Please Note:  You might notice some irritation and congestion in your nose or some drainage.  This is from the oxygen used during your procedure.  There is no need for concern and it should clear up in a day or so.  SYMPTOMS TO REPORT IMMEDIATELY:  Following lower endoscopy (colonoscopy or flexible sigmoidoscopy):  Excessive amounts of blood in the stool  Significant tenderness or worsening of abdominal pains  Swelling of the abdomen that is new, acute  Fever of 100F or higher    For urgent or emergent issues, a gastroenterologist can be reached at any hour by calling [phone]. Do not use MyChart messaging for urgent concerns.    DIET:  We do recommend a small meal at first, but then you may proceed to your regular diet.  Drink plenty of fluids but you should avoid alcoholic beverages for 24 hours.  ACTIVITY:  You should  plan to take it easy for the rest of today and you should NOT DRIVE or use heavy machinery until tomorrow (because of the sedation medicines used during the test).    FOLLOW UP: Our staff will call the number listed on your records 48-72 hours following your procedure to check on you and address any questions or concerns that you may have regarding the information given to you following your procedure. If we do not reach you, we will leave a message.  We will attempt to reach you two times.  During this call, we will ask if you have developed any symptoms of COVID 19. If you develop any symptoms (ie: fever, flu-like symptoms, shortness of breath, cough etc.) before then, please call [phone].  If you test positive for Covid 19 in the 2 weeks post procedure, please call and report this information to [place].    If any biopsies were taken you will be contacted by phone or by letter within the next 1-3 weeks.  Please call us at [phone] if you have not heard about the biopsies in 3 weeks.    SIGNATURES/CONFIDENTIALITY: You and/or your care partner have signed paperwork which will be entered into your electronic medical record.  These signatures attest to the fact that that the information above on your After Visit Summary has been reviewed and is understood.  Full responsibility of the confidentiality of this discharge information lies with  you and/or your care-partner.

## 2021-03-09 NOTE — Progress Notes (Signed)
A and O x3. Report to RN. Tolerated MAC anesthesia well. 

## 2021-03-09 NOTE — Progress Notes (Signed)
VS completed by DT.  Pt's states no medical or surgical changes since previsit or office visit.  

## 2021-03-11 ENCOUNTER — Telehealth: Payer: Self-pay

## 2021-03-11 NOTE — Telephone Encounter (Signed)
  Follow up Call-  Call back number 03/09/2021  Post procedure Call Back phone  # [phone]  Permission to leave phone message Yes  Some recent data might be hidden     Patient questions:  Do you have a fever, pain , or abdominal swelling? No. Pain Score  0 *  Have you tolerated food without any problems? Yes.    Have you been able to return to your normal activities? Yes.    Do you have any questions about your discharge instructions: Diet   No. Medications  No. Follow up visit  No.  Do you have questions or concerns about your Care? No.  Actions: * If pain score is 4 or above: No action needed, pain <4.  Have you developed a fever since your procedure? no  2.   Have you had an respiratory symptoms (SOB or cough) since your procedure? no  3.   Have you tested positive for COVID 19 since your procedure no  4.   Have you had any family members/close contacts diagnosed with the COVID 19 since your procedure?  no   If yes to any of these questions please route to Joylene John, RN and Joella Prince, RN

## 2021-03-21 ENCOUNTER — Other Ambulatory Visit: Payer: Self-pay

## 2021-03-21 ENCOUNTER — Ambulatory Visit (AMBULATORY_SURGERY_CENTER): Payer: HMO | Admitting: Internal Medicine

## 2021-03-21 ENCOUNTER — Other Ambulatory Visit: Payer: Self-pay | Admitting: Internal Medicine

## 2021-03-21 ENCOUNTER — Encounter: Payer: Self-pay | Admitting: Internal Medicine

## 2021-03-21 VITALS — BP 167/98 | HR 64 | Temp 97.5°F | Resp 10 | Ht 72.0 in | Wt 208.0 lb

## 2021-03-21 DIAGNOSIS — Z1211 Encounter for screening for malignant neoplasm of colon: Secondary | ICD-10-CM

## 2021-03-21 DIAGNOSIS — D123 Benign neoplasm of transverse colon: Secondary | ICD-10-CM

## 2021-03-21 MED ORDER — SODIUM CHLORIDE 0.9 % IV SOLN: 500.0000 mL | Freq: Once | INTRAVENOUS | Status: DC

## 2021-03-21 NOTE — Patient Instructions (Signed)
Be sure to keep your clip card with you.  You can't have a MRI for 1 month post procedure due to the metal clips.  Read all of the handouts given to you by your recovery room nurse.  YOU HAD AN ENDOSCOPIC PROCEDURE TODAY AT [facility]:   Refer to the procedure report that was given to you for any specific questions about what was found during the examination.  If the procedure report does not answer your questions, please call your gastroenterologist to clarify.  If you requested that your care partner not be given the details of your procedure findings, then the procedure report has been included in a sealed envelope for you to review at your convenience later.  YOU SHOULD EXPECT: Some feelings of bloating in the abdomen. Passage of more gas than usual.  Walking can help get rid of the air that was put into your GI tract during the procedure and reduce the bloating. If you had a lower endoscopy (such as a colonoscopy or flexible sigmoidoscopy) you may notice spotting of blood in your stool or on the toilet paper. If you underwent a bowel prep for your procedure, you may not have a normal bowel movement for a few days.  Please Note:  You might notice some irritation and congestion in your nose or some drainage.  This is from the oxygen used during your procedure.  There is no need for concern and it should clear up in a day or so.  SYMPTOMS TO REPORT IMMEDIATELY:  Following lower endoscopy (colonoscopy or flexible sigmoidoscopy):  Excessive amounts of blood in the stool  Significant tenderness or worsening of abdominal pains  Swelling of the abdomen that is new, acute  Fever of 100F or higher   For urgent or emergent issues, a gastroenterologist can be reached at any hour by calling [phone]. Do not use MyChart messaging for urgent concerns.    DIET:  We do recommend a small meal at first, but then you may proceed to your regular diet.  Drink plenty of fluids but you  should avoid alcoholic beverages for 24 hours.  ACTIVITY:  You should plan to take it easy for the rest of today and you should NOT DRIVE or use heavy machinery until tomorrow (because of the sedation medicines used during the test).    FOLLOW UP: Our staff will call the number listed on your records 48-72 hours following your procedure to check on you and address any questions or concerns that you may have regarding the information given to you following your procedure. If we do not reach you, we will leave a message.  We will attempt to reach you two times.  During this call, we will ask if you have developed any symptoms of COVID 19. If you develop any symptoms (ie: fever, flu-like symptoms, shortness of breath, cough etc.) before then, please call [phone].  If you test positive for Covid 19 in the 2 weeks post procedure, please call and report this information to [place].    If any biopsies were taken you will be contacted by phone or by letter within the next 1-3 weeks.  Please call us at [phone] if you have not heard about the biopsies in 3 weeks.    SIGNATURES/CONFIDENTIALITY: You and/or your care partner have signed paperwork which will be entered into your electronic medical record.  These signatures attest to the fact that that the information above on your After Visit Summary has  been reviewed and is understood.  Full responsibility of the confidentiality of this discharge information lies with you and/or your care-partner.  

## 2021-03-21 NOTE — Progress Notes (Signed)
To PACU, VSS. Report to [email or web address] 

## 2021-03-21 NOTE — Progress Notes (Signed)
VS by CW  I have reviewed the patient's medical history in detail and updated the computerized patient record.  

## 2021-03-21 NOTE — Progress Notes (Signed)
GASTROENTEROLOGY PROCEDURE H&P NOTE   Primary Care Physician: Noreene Larsson, NP    Reason for Procedure:  Colon cancer screening  Plan:    Colonoscopy  Patient is appropriate for endoscopic procedure(s) in the ambulatory ([facility]) setting.  The nature of the procedure, as well as the risks, benefits, and alternatives were carefully and thoroughly reviewed with the patient. Ample time for discussion and questions allowed. The patient understood, was satisfied, and agreed to proceed.     HPI: Jim Salazar is a 70 y.o. male who presents for screening colonoscopy.  Was here earlier this month with poor prep and so procedure was aborted.  No change in medical history since that time.  No complaints today.  Past Medical History:  Diagnosis Date   CAD (coronary artery disease), native coronary artery    mild nonobstructive plaque in LCx and RCA and mildly obstructive minimally calcified plaque in mid LAD on coronary CTA 11/2017   Depression    Diabetes mellitus without complication (HCC)    GERD (gastroesophageal reflux disease)    Hypertension    Hypothyroidism, unspecified    Left sided numbness 09/17/2017   Major depressive disorder, single episode, unspecified    Need for immunization against influenza 06/12/2019   Need for vaccination against Streptococcus pneumoniae using pneumococcal conjugate vaccine 13 06/12/2019   Obesity, unspecified    Right upper quadrant abdominal pain 09/22/2019   Screening for prostate cancer 06/12/2019   Syncope 10/17/2017   Thyroid disease    Tremor, essential 11/30/2017   Type 2 diabetes mellitus with hyperglycemia ([place])     Past Surgical History:  Procedure Laterality Date   APPENDECTOMY     TONSILLECTOMY      Prior to Admission medications   Medication Sig Start Date End Date Taking? Authorizing Provider  amLODipine (NORVASC) 10 MG tablet TAKE 1 TABLET BY MOUTH AT BEDTIME 01/26/21 01/26/22 Yes Noreene Larsson, NP  aspirin EC 81 MG  tablet Take 81 mg by mouth every evening.   Yes [provider]  atorvastatin (LIPITOR) 10 MG tablet TAKE 1 TABLET BY MOUTH ONCE A DAY 10/01/20 10/01/21 Yes Imogene Burn, PA-C  cetirizine (ZYRTEC) 10 MG tablet Take 1 tablet (10 mg total) by mouth daily. 01/13/20  Yes Wurst, [place], PA-C  Dulaglutide (TRULICITY) 1.5 0000000 SOPN Inject 1.5 mg into the skin once a week. 12/27/20 12/27/21 Yes Patel, Colin Broach, MD  fluticasone (FLONASE) 50 MCG/ACT nasal spray Place 2 sprays into both nostrils daily. 01/13/20  Yes Wurst, [place], PA-C  hydrochlorothiazide (MICROZIDE) 12.5 MG capsule Take 1 capsule (12.5 mg total) by mouth daily. 12/23/20  Yes Lindell Spar, MD  levothyroxine (SYNTHROID) 125 MCG tablet TAKE 1 TABLET BY MOUTH ONCE A DAY 01/06/21 01/06/22 Yes Perlie Mayo, NP  lisinopril (ZESTRIL) 40 MG tablet TAKE 1 TABLET BY MOUTH DAILY. 08/04/20 08/04/21 Yes Turner, Eber Hong, MD  PARoxetine (PAXIL) 20 MG tablet TAKE 1 TABLET BY MOUTH DAILY. 01/26/21 01/26/22 Yes Noreene Larsson, NP  Accu-Chek FastClix Lancets MISC  07/23/19   [provider]  FREESTYLE LITE test strip  10/29/19   [provider]  metFORMIN (GLUCOPHAGE) 1000 MG tablet TAKE 1 TABLET BY MOUTH 2 TIMES DAILY WITH A MEAL. 01/06/21 01/06/22  Perlie Mayo, NP  penicillin v potassium (VEETID) 500 MG tablet Take 500 mg by mouth 3 (three) times daily. Patient not taking: Reported on 03/21/2021 02/09/21   [provider]  JANUMET 50-1000 MG tablet Take 1  tablet by mouth 2 (two) times daily. 04/26/20 06/24/20  Perlie Mayo, NP    Current Outpatient Medications  Medication Sig Dispense Refill   amLODipine (NORVASC) 10 MG tablet TAKE 1 TABLET BY MOUTH AT BEDTIME 90 tablet 1   aspirin EC 81 MG tablet Take 81 mg by mouth every evening.     atorvastatin (LIPITOR) 10 MG tablet TAKE 1 TABLET BY MOUTH ONCE A DAY 90 tablet 3   cetirizine (ZYRTEC) 10 MG tablet Take 1 tablet (10 mg total) by mouth daily. 30 tablet 0   Dulaglutide  (TRULICITY) 1.5 0000000 SOPN Inject 1.5 mg into the skin once a week. 2 mL 3   fluticasone (FLONASE) 50 MCG/ACT nasal spray Place 2 sprays into both nostrils daily. 16 g 0   hydrochlorothiazide (MICROZIDE) 12.5 MG capsule Take 1 capsule (12.5 mg total) by mouth daily. 30 capsule 3   levothyroxine (SYNTHROID) 125 MCG tablet TAKE 1 TABLET BY MOUTH ONCE A DAY 90 tablet 1   lisinopril (ZESTRIL) 40 MG tablet TAKE 1 TABLET BY MOUTH DAILY. 90 tablet 3   PARoxetine (PAXIL) 20 MG tablet TAKE 1 TABLET BY MOUTH DAILY. 90 tablet 1   Accu-Chek FastClix Lancets MISC      FREESTYLE LITE test strip      metFORMIN (GLUCOPHAGE) 1000 MG tablet TAKE 1 TABLET BY MOUTH 2 TIMES DAILY WITH A MEAL. 180 tablet 1   penicillin v potassium (VEETID) 500 MG tablet Take 500 mg by mouth 3 (three) times daily. (Patient not taking: Reported on 03/21/2021)     Current Facility-Administered Medications  Medication Dose Route Frequency Provider Last Rate Last Admin   0.9 %  sodium chloride infusion  500 mL Intravenous Once Terrina Docter, Lajuan Lines, MD        Allergies as of 03/21/2021   (No Known Allergies)    Family History  Problem Relation Age of Onset   Diabetes Mellitus II Mother    Cancer Father        lung   Hypertension Other    Colon cancer Neg Hx    Colon polyps Neg Hx    Esophageal cancer Neg Hx    Rectal cancer Neg Hx    Stomach cancer Neg Hx     Social History   Socioeconomic History   Marital status: Married    Spouse name: [name]    Number of children: 2   Years of education: College   Highest education level: Not on file  Occupational History   Occupation: semi retired     Comment: Theme park manager   Tobacco Use   Smoking status: Never   Smokeless tobacco: Never  Vaping Use   Vaping Use: Never used  Substance and Sexual Activity   Alcohol use: No   Drug use: No   Sexual activity: Yes    Birth control/protection: None  Other Topics Concern   Not on file  Social History Narrative   Lives with 2nd wife  [name] -married 10 years      3 dogs: 2 inside and 1 outside: Skimp; Ginger; Eve      Enjoys: model railroad Museum/gallery curator, Chiropodist some      Diet: eats all food groups -raisins    Caffeine use: 1-2 cups decaf coffee daily, diet coke sometimes   Water: 4-5 bottles a day       Wears seat belt   Does not use phone while driving-hands free    Smoke detectors at home   Weapons in lock  box   Social Determinants of Radio broadcast assistant Strain: Low Risk    Difficulty of Paying Living Expenses: Not hard at all  Food Insecurity: No Food Insecurity   Worried About Charity fundraiser in the Last Year: Never true   Arboriculturist in the Last Year: Never true  Transportation Needs: No Transportation Needs   Lack of Transportation (Medical): No   Lack of Transportation (Non-Medical): No  Physical Activity: Sufficiently Active   Days of Exercise per Week: 6 days   Minutes of Exercise per Session: 40 min  Stress: No Stress Concern Present   Feeling of Stress : Not at all  Social Connections: Socially Integrated   Frequency of Communication with Friends and Family: More than three times a week   Frequency of Social Gatherings with Friends and Family: Three times a week   Attends Religious Services: More than 4 times per year   Active Member of Clubs or Organizations: Yes   Attends Music therapist: More than 4 times per year   Marital Status: Married  Human resources officer Violence: Not At Risk   Fear of Current or Ex-Partner: No   Emotionally Abused: No   Physically Abused: No   Sexually Abused: No    Physical Exam: Vital signs in last 24 hours: '@BP'$  (!) 157/58   Pulse 67   Temp (!) 97.5 F (36.4 C)   Ht 6' (1.829 m)   Wt 208 lb (94.3 kg)   SpO2 99%   BMI 28.21 kg/m  GEN: NAD EYE: Sclerae anicteric ENT: MMM CV: Non-tachycardic Pulm: CTA b/l GI: Soft, NT/ND NEURO:  Alert & Oriented x 3   Zenovia Jarred, MD [facility]  03/21/2021 11:47 AM

## 2021-03-21 NOTE — Op Note (Signed)
[place] Patient Name: Jim Salazar Procedure Date: 03/21/2021 11:54 AM MRN: [id number] Endoscopist: Jerene Bears , MD Age: 70 Referring MD:  Date of Birth: 1951-06-08 Gender: Male Account #: [id number] Procedure:                Colonoscopy Indications:              Screening for colorectal malignant neoplasm                            (inadequate prep earlier this month), 1st full                            colonoscopy Medicines:                Monitored Anesthesia Care Procedure:                Pre-Anesthesia Assessment:                           - Prior to the procedure, a History and Physical                            was performed, and patient medications and                            allergies were reviewed. The patient's tolerance of                            previous anesthesia was also reviewed. The risks                            and benefits of the procedure and the sedation                            options and risks were discussed with the patient.                            All questions were answered, and informed consent                            was obtained. Prior Anticoagulants: The patient has                            taken no previous anticoagulant or antiplatelet                            agents. ASA Grade Assessment: II - A patient with                            mild systemic disease. After reviewing the risks                            and benefits, the patient was deemed in  satisfactory condition to undergo the procedure.                           After obtaining informed consent, the colonoscope                            was passed under direct vision. Throughout the                            procedure, the patient's blood pressure, pulse, and                            oxygen saturations were monitored continuously. The                            Olympus [id number] [id number] was introduced through                             the anus and advanced to the cecum, identified by                            appendiceal orifice and ileocecal valve. The                            colonoscopy was performed without difficulty. The                            patient tolerated the procedure well. The quality                            of the bowel preparation was good. The ileocecal                            valve, appendiceal orifice, and rectum were                            photographed. Scope In: 11:55:57 AM Scope Out: 12:20:50 PM Scope Withdrawal Time: 0 hours 20 minutes 33 seconds  Total Procedure Duration: 0 hours 24 minutes 53 seconds  Findings:                 The digital rectal exam was normal.                           A 6 mm polyp was found in the transverse colon. The                            polyp was sessile. The polyp was removed with a                            cold snare. Resection and retrieval were complete.                            To stop active arterial bleeding, three hemostatic  clips were successfully placed (MR conditional).                            There was no bleeding at the end of the maneuver.                           Internal hemorrhoids were found during                            retroflexion. The hemorrhoids were small.                           The exam was otherwise without abnormality. Complications:            No immediate complications. Estimated Blood Loss:     Estimated blood loss was minimal. Impression:               - One 6 mm polyp in the transverse colon, removed                            with a cold snare. Resected and retrieved. Clips                            (MR conditional) were placed.                           - Small internal hemorrhoids.                           - The examination was otherwise normal. Recommendation:           - Patient has a contact number available for                            emergencies.  The signs and symptoms of potential                            delayed complications were discussed with the                            patient. Return to normal activities tomorrow.                            Written discharge instructions were provided to the                            patient.                           - Resume previous diet.                           - Continue present medications.                           - Await pathology results.                           -  Repeat colonoscopy is recommended. The                            colonoscopy date will be determined after pathology                            results from today's exam become available for                            review. Jerene Bears, MD 03/21/2021 12:43:43 PM This report has been signed electronically.

## 2021-03-23 ENCOUNTER — Telehealth: Payer: Self-pay

## 2021-03-23 NOTE — Telephone Encounter (Signed)
  Follow up Call-  Call back number 03/21/2021 03/09/2021  Post procedure Call Back phone  # [phone]  Permission to leave phone message Yes Yes  Some recent data might be hidden     Patient questions:  Do you have a fever, pain , or abdominal swelling? No. Pain Score  0 *  Have you tolerated food without any problems? Yes.    Have you been able to return to your normal activities? Yes.    Do you have any questions about your discharge instructions: Diet   No. Medications  No. Follow up visit  No.  Do you have questions or concerns about your Care? No.  Actions: * If pain score is 4 or above: No action needed, pain <4.  Have you developed a fever since your procedure? no  2.   Have you had an respiratory symptoms (SOB or cough) since your procedure? no  3.   Have you tested positive for COVID 19 since your procedure no  4.   Have you had any family members/close contacts diagnosed with the COVID 19 since your procedure?  no   If yes to any of these questions please route to Joylene John, RN and Joella Prince, RN

## 2021-03-23 NOTE — Telephone Encounter (Signed)
First post procedure follow up call, left message. °

## 2021-03-29 ENCOUNTER — Encounter: Payer: Self-pay | Admitting: Internal Medicine

## 2021-04-09 ENCOUNTER — Emergency Department (HOSPITAL_COMMUNITY)
Admission: EM | Admit: 2021-04-09 | Discharge: 2021-04-10 | Disposition: A | Discharge status: Home / Self Care | Payer: HMO | Attending: Student | Admitting: Student

## 2021-04-09 ENCOUNTER — Encounter (HOSPITAL_COMMUNITY): Payer: Self-pay | Admitting: Emergency Medicine

## 2021-04-09 ENCOUNTER — Other Ambulatory Visit: Payer: Self-pay

## 2021-04-09 DIAGNOSIS — Z7984 Long term (current) use of oral hypoglycemic drugs: Secondary | ICD-10-CM | POA: Insufficient documentation

## 2021-04-09 DIAGNOSIS — E039 Hypothyroidism, unspecified: Secondary | ICD-10-CM | POA: Diagnosis not present

## 2021-04-09 DIAGNOSIS — I10 Essential (primary) hypertension: Secondary | ICD-10-CM | POA: Diagnosis not present

## 2021-04-09 DIAGNOSIS — R112 Nausea with vomiting, unspecified: Secondary | ICD-10-CM | POA: Diagnosis present

## 2021-04-09 DIAGNOSIS — E119 Type 2 diabetes mellitus without complications: Secondary | ICD-10-CM | POA: Diagnosis not present

## 2021-04-09 DIAGNOSIS — U071 COVID-19: Secondary | ICD-10-CM | POA: Insufficient documentation

## 2021-04-09 DIAGNOSIS — Z79899 Other long term (current) drug therapy: Secondary | ICD-10-CM | POA: Diagnosis not present

## 2021-04-09 DIAGNOSIS — Z7982 Long term (current) use of aspirin: Secondary | ICD-10-CM | POA: Diagnosis not present

## 2021-04-09 DIAGNOSIS — I2510 Atherosclerotic heart disease of native coronary artery without angina pectoris: Secondary | ICD-10-CM | POA: Diagnosis not present

## 2021-04-09 DIAGNOSIS — E1165 Type 2 diabetes mellitus with hyperglycemia: Secondary | ICD-10-CM | POA: Diagnosis not present

## 2021-04-09 LAB — TROPONIN I (HIGH SENSITIVITY): Troponin I (High Sensitivity): 12 ng/L (ref <18)

## 2021-04-09 LAB — URINALYSIS, ROUTINE W REFLEX MICROSCOPIC
Bacteria, UA: NONE SEEN
Bilirubin Urine: NEGATIVE
Glucose, UA: >=500 mg/dL — AB
Ketones, ur: 20 mg/dL — AB
Leukocytes,Ua: NEGATIVE
Nitrite: NEGATIVE
Protein, ur: 30 mg/dL — AB
Specific Gravity, Urine: 1.017 (ref 1.005–1.030)
pH: 5 (ref 5.0–8.0)

## 2021-04-09 LAB — COMPREHENSIVE METABOLIC PANEL
ALT: 15 U/L (ref 0–44)
AST: 18 U/L (ref 15–41)
Albumin: 4.5 g/dL (ref 3.5–5.0)
Alkaline Phosphatase: 76 U/L (ref 38–126)
Anion gap: 11 (ref 5–15)
BUN: 20 mg/dL (ref 8–23)
CO2: 24 mmol/L (ref 22–32)
Calcium: 8.4 mg/dL — ABNORMAL LOW (ref 8.9–10.3)
Chloride: 96 mmol/L — ABNORMAL LOW (ref 98–111)
Creatinine, Ser: 1.28 mg/dL — ABNORMAL HIGH (ref 0.61–1.24)
GFR, Estimated: >60 mL/min (ref >60)
Glucose, Bld: 302 mg/dL — ABNORMAL HIGH (ref 70–99)
Potassium: 3.5 mmol/L (ref 3.5–5.1)
Sodium: 131 mmol/L — ABNORMAL LOW (ref 135–145)
Total Bilirubin: 2.2 mg/dL — ABNORMAL HIGH (ref 0.3–1.2)
Total Protein: 7.6 g/dL (ref 6.5–8.1)

## 2021-04-09 LAB — CBC WITH DIFFERENTIAL/PLATELET
Abs Immature Granulocytes: 0.05 10*3/uL (ref 0.00–0.07)
Basophils Absolute: 0 10*3/uL (ref 0.0–0.1)
Basophils Relative: 0 %
Eosinophils Absolute: 0.1 10*3/uL (ref 0.0–0.5)
Eosinophils Relative: 1 %
HCT: 44.9 % (ref 39.0–52.0)
Hemoglobin: 15.2 g/dL (ref 13.0–17.0)
Immature Granulocytes: 0 %
Lymphocytes Relative: 3 %
Lymphs Abs: 0.4 10*3/uL — ABNORMAL LOW (ref 0.7–4.0)
MCH: 29.6 pg (ref 26.0–34.0)
MCHC: 33.9 g/dL (ref 30.0–36.0)
MCV: 87.4 fL (ref 80.0–100.0)
Monocytes Absolute: 1 10*3/uL (ref 0.1–1.0)
Monocytes Relative: 9 %
Neutro Abs: 9.9 10*3/uL — ABNORMAL HIGH (ref 1.7–7.7)
Neutrophils Relative %: 87 %
Platelets: 168 10*3/uL (ref 150–400)
RBC: 5.14 MIL/uL (ref 4.22–5.81)
RDW: 13 % (ref 11.5–15.5)
WBC: 11.4 10*3/uL — ABNORMAL HIGH (ref 4.0–10.5)
nRBC: 0 % (ref 0.0–0.2)

## 2021-04-09 LAB — LIPASE, BLOOD: Lipase: 28 U/L (ref 11–51)

## 2021-04-09 LAB — TSH: TSH: 0.322 u[IU]/mL — ABNORMAL LOW (ref 0.350–4.500)

## 2021-04-09 LAB — LACTIC ACID, PLASMA
Lactic Acid, Venous: 1.9 mmol/L (ref 0.5–1.9)
Lactic Acid, Venous: 2.3 mmol/L (ref 0.5–1.9)

## 2021-04-09 LAB — POC SARS CORONAVIRUS 2 AG -  ED: SARSCOV2ONAVIRUS 2 AG: POSITIVE — AB

## 2021-04-09 MED ORDER — LACTATED RINGERS IV BOLUS
1000.0000 mL | Freq: Once | INTRAVENOUS | Status: AC
  Administered 2021-04-09: 1000 mL via INTRAVENOUS

## 2021-04-09 MED ORDER — ONDANSETRON HCL 4 MG/2ML IJ SOLN
4.0000 mg | Freq: Once | INTRAMUSCULAR | Status: AC
  Administered 2021-04-09: 4 mg via INTRAVENOUS
  Filled 2021-04-09: qty 2

## 2021-04-09 MED ORDER — NIRMATRELVIR/RITONAVIR (PAXLOVID)TABLET
3.0000 | ORAL_TABLET | Freq: Two times a day (BID) | ORAL | 0 refills | Status: AC
  Filled 2021-04-09: qty 30, 5d supply, fill #0

## 2021-04-09 MED ORDER — IBUPROFEN 400 MG PO TABS
600.0000 mg | ORAL_TABLET | Freq: Once | ORAL | Status: AC
  Administered 2021-04-09: 600 mg via ORAL
  Filled 2021-04-09: qty 2

## 2021-04-09 NOTE — ED Provider Notes (Signed)
[facility] EMERGENCY DEPARTMENT Provider Note   CSN: [id number] Arrival date & time: 04/09/21  1857     History Chief Complaint  Patient presents with   Covid Positive    Jim Salazar is a 70 y.o. male CAD, HTN with PMH CAD, HTN, DM, thyroid disease presents the emergency department for evaluation of nausea and vomiting in the setting of known COVID-19.  Patient states he has had symptoms for 2 days and has been unable to tolerate p.o. over the last 2 days.  He says he feels increasingly fatigued but denies chest pain, shortness of breath, abdominal pain, diarrhea, headache or other systemic symptoms.  Patient arrives tachycardic and febrile but otherwise hemodynamically stable.  HPI     Past Medical History:  Diagnosis Date   CAD (coronary artery disease), native coronary artery    mild nonobstructive plaque in LCx and RCA and mildly obstructive minimally calcified plaque in mid LAD on coronary CTA 11/2017   Depression    Diabetes mellitus without complication (HCC)    GERD (gastroesophageal reflux disease)    Hypertension    Hypothyroidism, unspecified    Left sided numbness 09/17/2017   Major depressive disorder, single episode, unspecified    Need for immunization against influenza 06/12/2019   Need for vaccination against Streptococcus pneumoniae using pneumococcal conjugate vaccine 13 06/12/2019   Obesity, unspecified    Right upper quadrant abdominal pain 09/22/2019   Screening for prostate cancer 06/12/2019   Syncope 10/17/2017   Thyroid disease    Tremor, essential 11/30/2017   Type 2 diabetes mellitus with hyperglycemia ([place])     Patient Active Problem List   Diagnosis Date Noted   Overweight with body mass index (BMI) of 28 to 28.9 in adult 06/24/2020   Sore throat and laryngitis 01/15/2020   Encounter for hepatitis C screening test for low risk patient 12/11/2019   Encounter for screening for malignant neoplasm of colon 12/11/2019   Dupuytren contracture  12/11/2019   Reactive depression 12/11/2019   Need for immunization against influenza 06/12/2019   Hypothyroidism, unspecified    Tremor, essential 11/30/2017   CAD (coronary artery disease), native coronary artery    Essential hypertension 09/16/2017   Diabetes mellitus type 2, uncontrolled ([place]) 09/16/2017    Past Surgical History:  Procedure Laterality Date   APPENDECTOMY     TONSILLECTOMY         Family History  Problem Relation Age of Onset   Diabetes Mellitus II Mother    Cancer Father        lung   Hypertension Other    Colon cancer Neg Hx    Colon polyps Neg Hx    Esophageal cancer Neg Hx    Rectal cancer Neg Hx    Stomach cancer Neg Hx     Social History   Tobacco Use   Smoking status: Never   Smokeless tobacco: Never  Vaping Use   Vaping Use: Never used  Substance Use Topics   Alcohol use: No   Drug use: No    Home Medications Prior to Admission medications   Medication Sig Start Date End Date Taking? Authorizing Provider  nirmatrelvir/ritonavir EUA (PAXLOVID) 20 x 150 MG & 10 x '100MG'$  TABS Take 3 tablets by mouth 2 (two) times daily for 5 days. Patient GFR is >60. Take nirmatrelvir (150 mg) two tablets twice daily for 5 days and ritonavir (100 mg) one tablet twice daily for 5 days. 04/09/21 04/14/21 Yes Suan Pyeatt, MD  Accu-Chek  FastClix Lancets MISC  07/23/19   [provider]  amLODipine (NORVASC) 10 MG tablet TAKE 1 TABLET BY MOUTH AT BEDTIME 01/26/21 01/26/22  Noreene Larsson, NP  aspirin EC 81 MG tablet Take 81 mg by mouth every evening.    [provider]  atorvastatin (LIPITOR) 10 MG tablet TAKE 1 TABLET BY MOUTH ONCE A DAY 10/01/20 10/01/21  Imogene Burn, PA-C  cetirizine (ZYRTEC) 10 MG tablet Take 1 tablet (10 mg total) by mouth daily. 01/13/20   Wurst, [place], PA-C  Dulaglutide (TRULICITY) 1.5 0000000 SOPN Inject 1.5 mg into the skin once a week. 12/27/20 12/27/21  Lindell Spar, MD  fluticasone (FLONASE) 50 MCG/ACT nasal  spray Place 2 sprays into both nostrils daily. 01/13/20   Wurst, [place], PA-C  FREESTYLE LITE test strip  10/29/19   [provider]  hydrochlorothiazide (MICROZIDE) 12.5 MG capsule Take 1 capsule (12.5 mg total) by mouth daily. 12/23/20   Lindell Spar, MD  levothyroxine (SYNTHROID) 125 MCG tablet TAKE 1 TABLET BY MOUTH ONCE A DAY 01/06/21 01/06/22  Perlie Mayo, NP  lisinopril (ZESTRIL) 40 MG tablet TAKE 1 TABLET BY MOUTH DAILY. 08/04/20 08/04/21  Sueanne Margarita, MD  metFORMIN (GLUCOPHAGE) 1000 MG tablet TAKE 1 TABLET BY MOUTH 2 TIMES DAILY WITH A MEAL. 01/06/21 01/06/22  Perlie Mayo, NP  PARoxetine (PAXIL) 20 MG tablet TAKE 1 TABLET BY MOUTH DAILY. 01/26/21 01/26/22  Noreene Larsson, NP  penicillin v potassium (VEETID) 500 MG tablet Take 500 mg by mouth 3 (three) times daily. Patient not taking: Reported on 03/21/2021 02/09/21   [provider]  JANUMET 50-1000 MG tablet Take 1 tablet by mouth 2 (two) times daily. 04/26/20 06/24/20  Perlie Mayo, NP    Allergies    Patient has no known allergies.  Review of Systems   Review of Systems  Constitutional:  Positive for fatigue. Negative for chills and fever.  HENT:  Negative for ear pain and sore throat.   Eyes:  Negative for pain and visual disturbance.  Respiratory:  Negative for cough and shortness of breath.   Cardiovascular:  Negative for chest pain and palpitations.  Gastrointestinal:  Positive for nausea and vomiting. Negative for abdominal pain.  Genitourinary:  Negative for dysuria and hematuria.  Musculoskeletal:  Negative for arthralgias and back pain.  Skin:  Negative for color change and rash.  Neurological:  Negative for seizures and syncope.  All other systems reviewed and are negative.  Physical Exam Updated Vital Signs BP 128/74   Pulse (!) 103   Temp (!) 102.8 F (39.3 C)   Resp (!) 24   Ht 6' (1.829 m)   Wt 93 kg   SpO2 94%   BMI 27.80 kg/m   Physical Exam Vitals and nursing note reviewed.   Constitutional:      Appearance: He is well-developed.  HENT:     Head: Normocephalic and atraumatic.  Eyes:     Conjunctiva/sclera: Conjunctivae normal.  Cardiovascular:     Rate and Rhythm: Normal rate and regular rhythm.     Heart sounds: No murmur heard. Pulmonary:     Effort: Pulmonary effort is normal. No respiratory distress.     Breath sounds: Normal breath sounds.  Abdominal:     Palpations: Abdomen is soft.     Tenderness: There is no abdominal tenderness.  Musculoskeletal:     Cervical back: Neck supple.  Skin:    General: Skin is warm and dry.  Neurological:     Mental Status: He is alert.    ED Results / Procedures / Treatments   Labs (all labs ordered are listed, but only abnormal results are displayed) Labs Reviewed  CBC WITH DIFFERENTIAL/PLATELET - Abnormal; Notable for the following components:      Result Value   WBC 11.4 (*)    Neutro Abs 9.9 (*)    Lymphs Abs 0.4 (*)    All other components within normal limits  COMPREHENSIVE METABOLIC PANEL - Abnormal; Notable for the following components:   Sodium 131 (*)    Chloride 96 (*)    Glucose, Bld 302 (*)    Creatinine, Ser 1.28 (*)    Calcium 8.4 (*)    Total Bilirubin 2.2 (*)    All other components within normal limits  URINALYSIS, ROUTINE W REFLEX MICROSCOPIC - Abnormal; Notable for the following components:   Glucose, UA >=500 (*)    Hgb urine dipstick SMALL (*)    Ketones, ur 20 (*)    Protein, ur 30 (*)    All other components within normal limits  LACTIC ACID, PLASMA - Abnormal; Notable for the following components:   Lactic Acid, Venous 2.3 (*)    All other components within normal limits  TSH - Abnormal; Notable for the following components:   TSH 0.322 (*)    All other components within normal limits  POC SARS CORONAVIRUS 2 AG -  ED - Abnormal; Notable for the following components:   SARSCOV2ONAVIRUS 2 AG POSITIVE (*)    All other components within normal limits  LIPASE, BLOOD   LACTIC ACID, PLASMA  TROPONIN I (HIGH SENSITIVITY)  TROPONIN I (HIGH SENSITIVITY)    EKG None  Radiology No results found.  Procedures Procedures   Medications Ordered in ED Medications  lactated ringers bolus 1,000 mL (0 mLs Intravenous Stopped 04/09/21 2314)  ibuprofen (ADVIL) tablet 600 mg (600 mg Oral Given 04/09/21 2128)  ondansetron (ZOFRAN) injection 4 mg (4 mg Intravenous Given 04/09/21 2127)    ED Course  I have reviewed the triage vital signs and the nursing notes.  Pertinent labs & imaging results that were available during my care of the patient were reviewed by me and considered in my medical decision making (see chart for details).    MDM Rules/Calculators/A&P                          Patient seen emergency department for evaluation of persistent nausea and vomiting in the setting of a known COVID-19 infection.  Physical exam is unremarkable.  He arrives febrile and tachycardic likely secondary to his known COVID-19 infection, and thus a sepsis alert was not initially called.  Laboratory evaluation reveals hyponatremia 131, hypochloremia to 96, creatinine elevation 1.28, leukocytosis to 11.4, urinalysis unremarkable, high-sensitivity troponin unremarkable, initial lactate 2.3, lipase unremarkable.  Patient receiving a total of 2 L lactated Ringer's and patient will be sent attending provider.  Anticipate discharge after completion of fluid resuscitation and improvement of vital signs with a prescription for paxlovid.   Final Clinical Impression(s) / ED Diagnoses Final diagnoses:  COVID-19    Rx / DC Orders ED Discharge Orders          Ordered    nirmatrelvir/ritonavir EUA (PAXLOVID) 20 x 150 MG & 10 x '100MG'$  TABS  2 times daily        04/09/21 2317  Teressa Lower, MD 04/10/21 [phone]

## 2021-04-09 NOTE — ED Notes (Signed)
Date and time results received: 04/09/21 2335   Test: LACTIC Critical Value: 2.3  Name of Provider Notified: Kommor, MD

## 2021-04-09 NOTE — ED Triage Notes (Addendum)
Pt states he tested positive for COVID yesterday. Pt c/o nausea and vomiting. Pt states he took '1000mg'$  tylenol and '400mg'$  ibuprofen about 3 to 4 hours PTA.

## 2021-04-10 LAB — TROPONIN I (HIGH SENSITIVITY): Troponin I (High Sensitivity): 13 ng/L (ref <18)

## 2021-04-10 MED ORDER — LACTATED RINGERS IV BOLUS
1000.0000 mL | Freq: Once | INTRAVENOUS | Status: AC
  Administered 2021-04-10: 1000 mL via INTRAVENOUS

## 2021-04-11 ENCOUNTER — Other Ambulatory Visit (HOSPITAL_COMMUNITY): Payer: Self-pay

## 2021-04-13 ENCOUNTER — Other Ambulatory Visit (HOSPITAL_COMMUNITY): Payer: Self-pay

## 2021-04-21 ENCOUNTER — Other Ambulatory Visit (HOSPITAL_COMMUNITY): Payer: Self-pay

## 2021-04-25 ENCOUNTER — Telehealth: Payer: Self-pay | Admitting: Internal Medicine

## 2021-04-25 NOTE — Telephone Encounter (Signed)
Patient called regarding 9am appointment tomorrow , wants to know how far out he needs to be on his COVID meds before he can come in to office

## 2021-04-26 ENCOUNTER — Telehealth: Payer: Self-pay

## 2021-04-26 ENCOUNTER — Other Ambulatory Visit: Payer: Self-pay

## 2021-04-26 ENCOUNTER — Encounter: Payer: 59 | Admitting: Family Medicine

## 2021-04-26 ENCOUNTER — Encounter: Payer: Self-pay | Admitting: Nurse Practitioner

## 2021-04-26 ENCOUNTER — Ambulatory Visit (INDEPENDENT_AMBULATORY_CARE_PROVIDER_SITE_OTHER): Payer: HMO | Admitting: Nurse Practitioner

## 2021-04-26 VITALS — BP 147/77 | HR 93 | Temp 98.5°F | Ht 71.0 in | Wt 198.1 lb

## 2021-04-26 DIAGNOSIS — Z0001 Encounter for general adult medical examination with abnormal findings: Secondary | ICD-10-CM

## 2021-04-26 DIAGNOSIS — E039 Hypothyroidism, unspecified: Secondary | ICD-10-CM | POA: Diagnosis not present

## 2021-04-26 DIAGNOSIS — Z139 Encounter for screening, unspecified: Secondary | ICD-10-CM | POA: Diagnosis not present

## 2021-04-26 DIAGNOSIS — E1165 Type 2 diabetes mellitus with hyperglycemia: Secondary | ICD-10-CM | POA: Diagnosis not present

## 2021-04-26 DIAGNOSIS — Z23 Encounter for immunization: Secondary | ICD-10-CM

## 2021-04-26 DIAGNOSIS — I10 Essential (primary) hypertension: Secondary | ICD-10-CM

## 2021-04-26 NOTE — Assessment & Plan Note (Signed)
BP Readings from Last 3 Encounters:  04/26/21 (!) 147/77  04/10/21 (!) 156/82  03/21/21 (!) 167/98   -BP better today than previous, but still elevated

## 2021-04-26 NOTE — Telephone Encounter (Signed)
Received referral on pt but he does not have any updated labs. I left him a VM to call back because he will need to do those labs from patel's office in order for [place] to see him

## 2021-04-26 NOTE — Patient Instructions (Signed)
Please have fasting labs drawn today. 

## 2021-04-26 NOTE — Assessment & Plan Note (Signed)
-  check thyroid labs

## 2021-04-26 NOTE — Progress Notes (Signed)
Established Patient Office Visit  Subjective:  Patient ID: Jim Salazar, male    DOB: Feb 07, 1951  Age: 70 y.o. MRN: [id number]  CC:  Chief Complaint  Patient presents with   Annual Exam    Physical wants to discuss referral to endocrinology     [name] presents for physical exam. He had labs drawn on 04/09/21 at the ED.  He would like to see endocrinology for elevated blood sugars. He recently started trulicity, and he has had some nausea.  He had COVID a week ago, and he stopped some medications at that time while he was taking paxlovid. He also had to stop some medications while doing colonoscopy prep.  Past Medical History:  Diagnosis Date   CAD (coronary artery disease), native coronary artery    mild nonobstructive plaque in LCx and RCA and mildly obstructive minimally calcified plaque in mid LAD on coronary CTA 11/2017   Depression    Diabetes mellitus without complication (HCC)    GERD (gastroesophageal reflux disease)    Hypertension    Hypothyroidism, unspecified    Left sided numbness 09/17/2017   Major depressive disorder, single episode, unspecified    Need for immunization against influenza 06/12/2019   Need for vaccination against Streptococcus pneumoniae using pneumococcal conjugate vaccine 13 06/12/2019   Obesity, unspecified    Right upper quadrant abdominal pain 09/22/2019   Screening for prostate cancer 06/12/2019   Syncope 10/17/2017   Thyroid disease    Tremor, essential 11/30/2017   Type 2 diabetes mellitus with hyperglycemia (HCC)     Past Surgical History:  Procedure Laterality Date   APPENDECTOMY     TONSILLECTOMY      Family History  Problem Relation Age of Onset   Diabetes Mellitus II Mother    Cancer Father        lung   Hypertension Other    Colon cancer Neg Hx    Colon polyps Neg Hx    Esophageal cancer Neg Hx    Rectal cancer Neg Hx    Stomach cancer Neg Hx     Social History   Socioeconomic History   Marital  status: Married    Spouse name: [name]    Number of children: 2   Years of education: College   Highest education level: Not on file  Occupational History   Occupation: semi retired     Comment: Theme park manager   Tobacco Use   Smoking status: Never   Smokeless tobacco: Never  Vaping Use   Vaping Use: Never used  Substance and Sexual Activity   Alcohol use: No   Drug use: No   Sexual activity: Yes    Birth control/protection: None  Other Topics Concern   Not on file  Social History Narrative   Lives with 2nd wife [name] -married 10 years      3 dogs: 2 inside and 1 outside: Skimp; Ginger; Eve      Enjoys: model railroad Museum/gallery curator, Chiropodist some      Diet: eats all food groups -raisins    Caffeine use: 1-2 cups decaf coffee daily, diet coke sometimes   Water: 4-5 bottles a day       Wears seat belt   Does not use phone while driving-hands free    Oceanographer at home   Weapons in lock box   Social Determinants of Health   Financial Resource Strain: Low Risk    Difficulty of Paying Living Expenses: Not hard at all  Food Insecurity: No Food Insecurity   Worried About Charity fundraiser in the Last Year: Never true   Ran Out of Food in the Last Year: Never true  Transportation Needs: No Transportation Needs   Lack of Transportation (Medical): No   Lack of Transportation (Non-Medical): No  Physical Activity: Sufficiently Active   Days of Exercise per Week: 6 days   Minutes of Exercise per Session: 40 min  Stress: No Stress Concern Present   Feeling of Stress : Not at all  Social Connections: Socially Integrated   Frequency of Communication with Friends and Family: More than three times a week   Frequency of Social Gatherings with Friends and Family: Three times a week   Attends Religious Services: More than 4 times per year   Active Member of Clubs or Organizations: Yes   Attends Music therapist: More than 4 times per year   Marital Status: Married  Arboriculturist Violence: Not At Risk   Fear of Current or Ex-Partner: No   Emotionally Abused: No   Physically Abused: No   Sexually Abused: No    Outpatient Medications Prior to Visit  Medication Sig Dispense Refill   Accu-Chek FastClix Lancets MISC      amLODipine (NORVASC) 10 MG tablet TAKE 1 TABLET BY MOUTH AT BEDTIME 90 tablet 1   aspirin EC 81 MG tablet Take 81 mg by mouth every evening.     atorvastatin (LIPITOR) 10 MG tablet TAKE 1 TABLET BY MOUTH ONCE A DAY 90 tablet 3   cetirizine (ZYRTEC) 10 MG tablet Take 1 tablet (10 mg total) by mouth daily. 30 tablet 0   Dulaglutide (TRULICITY) 1.5 LK/4.4WN SOPN Inject 1.5 mg into the skin once a week. 2 mL 3   fluticasone (FLONASE) 50 MCG/ACT nasal spray Place 2 sprays into both nostrils daily. 16 g 0   FREESTYLE LITE test strip      hydrochlorothiazide (MICROZIDE) 12.5 MG capsule Take 1 capsule (12.5 mg total) by mouth daily. 30 capsule 3   levothyroxine (SYNTHROID) 125 MCG tablet TAKE 1 TABLET BY MOUTH ONCE A DAY 90 tablet 1   lisinopril (ZESTRIL) 40 MG tablet TAKE 1 TABLET BY MOUTH DAILY. 90 tablet 3   metFORMIN (GLUCOPHAGE) 1000 MG tablet TAKE 1 TABLET BY MOUTH 2 TIMES DAILY WITH A MEAL. 180 tablet 1   PARoxetine (PAXIL) 20 MG tablet TAKE 1 TABLET BY MOUTH DAILY. 90 tablet 1   penicillin v potassium (VEETID) 500 MG tablet Take 500 mg by mouth 3 (three) times daily.     No facility-administered medications prior to visit.    No Known Allergies  ROS Review of Systems  Constitutional: Negative.   HENT: Negative.    Eyes: Negative.   Respiratory: Negative.    Cardiovascular: Negative.   Gastrointestinal: Negative.   Endocrine: Negative.   Genitourinary: Negative.   Musculoskeletal: Negative.   Skin: Negative.   Allergic/Immunologic: Negative.   Neurological: Negative.   Hematological: Negative.   Psychiatric/Behavioral: Negative.       Objective:    Physical Exam Constitutional:      Appearance: Normal appearance.  HENT:      Head: Normocephalic and atraumatic.     Right Ear: Tympanic membrane, ear canal and external ear normal.     Left Ear: Tympanic membrane, ear canal and external ear normal.     Nose: Nose normal.     Mouth/Throat:     Mouth: Mucous membranes are moist.  Pharynx: Oropharynx is clear.  Eyes:     Extraocular Movements: Extraocular movements intact.     Conjunctiva/sclera: Conjunctivae normal.     Pupils: Pupils are equal, round, and reactive to light.  Cardiovascular:     Rate and Rhythm: Normal rate and regular rhythm.     Pulses: Normal pulses.          Dorsalis pedis pulses are 2+ on the right side and 2+ on the left side.     Heart sounds: Normal heart sounds.  Pulmonary:     Effort: Pulmonary effort is normal.     Breath sounds: Normal breath sounds.  Abdominal:     General: Abdomen is flat. Bowel sounds are normal.     Palpations: Abdomen is soft.  Musculoskeletal:        General: Normal range of motion.     Cervical back: Normal range of motion and neck supple.  Feet:     Right foot:     Protective Sensation: 10 sites tested.  9 sites sensed.     Skin integrity: Dry skin present.     Toenail Condition: Right toenails are abnormally thick.     Left foot:     Protective Sensation: 10 sites tested.  9 sites sensed.     Skin integrity: Dry skin present.     Toenail Condition: Left toenails are abnormally thick.  Skin:    General: Skin is warm and dry.     Capillary Refill: Capillary refill takes less than 2 seconds.  Neurological:     General: No focal deficit present.     Mental Status: He is alert and oriented to person, place, and time.     Cranial Nerves: No cranial nerve deficit.     Sensory: No sensory deficit.     Motor: No weakness.     Coordination: Coordination normal.     Gait: Gait normal.  Psychiatric:        Mood and Affect: Mood normal.        Behavior: Behavior normal.        Thought Content: Thought content normal.        Judgment: Judgment  normal.    BP (!) 147/77 (BP Location: Right Arm, Patient Position: Sitting, Cuff Size: Large)   Pulse 93   Temp 98.5 F (36.9 C) (Oral)   Ht 5' 11"  (1.803 m)   Wt 198 lb 1.9 oz (89.9 kg)   SpO2 93%   BMI 27.63 kg/m  Wt Readings from Last 3 Encounters:  04/26/21 198 lb 1.9 oz (89.9 kg)  04/09/21 205 lb (93 kg)  03/21/21 208 lb (94.3 kg)     Health Maintenance Due  Topic Date Due   Hepatitis C Screening  Never done   TETANUS/TDAP  Never done   Zoster Vaccines- Shingrix (1 of 2) Never done   COVID-19 Vaccine (2 - Booster for Janssen series) 12/12/2019    There are no preventive care reminders to display for this patient.  Lab Results  Component Value Date   TSH 0.322 (L) 04/09/2021   Lab Results  Component Value Date   WBC 11.4 (H) 04/09/2021   HGB 15.2 04/09/2021   HCT 44.9 04/09/2021   MCV 87.4 04/09/2021   PLT 168 04/09/2021   Lab Results  Component Value Date   NA 131 (L) 04/09/2021   K 3.5 04/09/2021   CO2 24 04/09/2021   GLUCOSE 302 (H) 04/09/2021   BUN 20 04/09/2021   CREATININE  1.28 (H) 04/09/2021   BILITOT 2.2 (H) 04/09/2021   ALKPHOS 76 04/09/2021   AST 18 04/09/2021   ALT 15 04/09/2021   PROT 7.6 04/09/2021   ALBUMIN 4.5 04/09/2021   CALCIUM 8.4 (L) 04/09/2021   ANIONGAP 11 04/09/2021   EGFR 80 09/22/2020   Lab Results  Component Value Date   CHOL 113 09/22/2020   Lab Results  Component Value Date   HDL 48 09/22/2020   Lab Results  Component Value Date   LDLCALC 49 09/22/2020   Lab Results  Component Value Date   TRIG 78 09/22/2020   Lab Results  Component Value Date   CHOLHDL 2.4 09/22/2020   Lab Results  Component Value Date   HGBA1C 7.8 (A) 12/23/2020      Assessment & Plan:   Problem List Items Addressed This Visit       Cardiovascular and Mediastinum   Essential hypertension    BP Readings from Last 3 Encounters:  04/26/21 (!) 147/77  04/10/21 (!) 156/82  03/21/21 (!) 167/98  -BP better today than previous,  but still elevated      Relevant Orders   CBC with Differential/Platelet   CMP14+EGFR   Lipid Panel With LDL/HDL Ratio     Endocrine   Type 2 diabetes mellitus with hyperglycemia (HCC)    Lab Results  Component Value Date   HGBA1C 7.8 (A) 12/23/2020  -goal LDL < 70 -taking lisinopril and atorvastatin -for glycemic control taking trulicity and metformin -he states he has some nausea with trulicity, but he would like endocrinology referral for DM      Relevant Orders   Microalbumin / creatinine urine ratio   Hemoglobin A1c   Ambulatory referral to Endocrinology   Hypothyroidism, unspecified    -check thyroid labs      Relevant Orders   TSH + free T4     Other   Need for immunization against influenza   Relevant Orders   Flu Vaccine QUAD High Dose(Fluad) (Completed)   Other Visit Diagnoses     Encounter for general adult medical examination with abnormal findings    -  Primary   Relevant Orders   CBC with Differential/Platelet   CMP14+EGFR   Lipid Panel With LDL/HDL Ratio   Microalbumin / creatinine urine ratio   TSH + free T4   Hemoglobin A1c   Hepatitis C antibody   Screening due       Relevant Orders   Hepatitis C antibody       No orders of the defined types were placed in this encounter.   Follow-up: Return in about 6 months (around 10/25/2021) for Lab follow-up (HTN, thyroid).    Noreene Larsson, NP

## 2021-04-26 NOTE — Assessment & Plan Note (Addendum)
Lab Results  Component Value Date   HGBA1C 7.8 (A) 12/23/2020  -goal LDL < 70 -taking lisinopril and atorvastatin -for glycemic control taking trulicity and metformin -he states he has some nausea with trulicity, but he would like endocrinology referral for DM

## 2021-04-27 ENCOUNTER — Other Ambulatory Visit: Payer: Self-pay | Admitting: Nurse Practitioner

## 2021-04-27 DIAGNOSIS — E039 Hypothyroidism, unspecified: Secondary | ICD-10-CM

## 2021-04-27 NOTE — Progress Notes (Signed)
TSH is slightly elevated, but was at the low end of normal previously. Recheck thyroid labs in 1 month. He will need lab f/u.

## 2021-04-27 NOTE — Progress Notes (Signed)
-  TSH elevated slightly today, but was on the low end of normal 2 weeks ago -recheck in 1 month

## 2021-04-28 LAB — CBC WITH DIFFERENTIAL/PLATELET
Basophils Absolute: 0.1 10*3/uL (ref 0.0–0.2)
Basos: 1 %
EOS (ABSOLUTE): 0.1 10*3/uL (ref 0.0–0.4)
Eos: 2 %
Hematocrit: 42.3 % (ref 37.5–51.0)
Hemoglobin: 13.8 g/dL (ref 13.0–17.7)
Immature Grans (Abs): 0 10*3/uL (ref 0.0–0.1)
Immature Granulocytes: 0 %
Lymphocytes Absolute: 2 10*3/uL (ref 0.7–3.1)
Lymphs: 23 %
MCH: 28.2 pg (ref 26.6–33.0)
MCHC: 32.6 g/dL (ref 31.5–35.7)
MCV: 86 fL (ref 79–97)
Monocytes Absolute: 0.6 10*3/uL (ref 0.1–0.9)
Monocytes: 7 %
Neutrophils Absolute: 6.1 10*3/uL (ref 1.4–7.0)
Neutrophils: 67 %
Platelets: 421 10*3/uL (ref 150–450)
RBC: 4.9 x10E6/uL (ref 4.14–5.80)
RDW: 12.2 % (ref 11.6–15.4)
WBC: 8.9 10*3/uL (ref 3.4–10.8)

## 2021-04-28 LAB — CMP14+EGFR
ALT: 15 IU/L (ref 0–44)
AST: 19 IU/L (ref 0–40)
Albumin/Globulin Ratio: 1.7 (ref 1.2–2.2)
Albumin: 4.2 g/dL (ref 3.8–4.8)
Alkaline Phosphatase: 85 IU/L (ref 44–121)
BUN/Creatinine Ratio: 14 (ref 10–24)
BUN: 15 mg/dL (ref 8–27)
Bilirubin Total: 0.7 mg/dL (ref 0.0–1.2)
CO2: 21 mmol/L (ref 20–29)
Calcium: 9.4 mg/dL (ref 8.6–10.2)
Chloride: 95 mmol/L — ABNORMAL LOW (ref 96–106)
Creatinine, Ser: 1.05 mg/dL (ref 0.76–1.27)
Globulin, Total: 2.5 g/dL (ref 1.5–4.5)
Glucose: 320 mg/dL — ABNORMAL HIGH (ref 70–99)
Potassium: 4.4 mmol/L (ref 3.5–5.2)
Sodium: 137 mmol/L (ref 134–144)
Total Protein: 6.7 g/dL (ref 6.0–8.5)
eGFR: 77 mL/min/{1.73_m2} (ref >59)

## 2021-04-28 LAB — LIPID PANEL WITH LDL/HDL RATIO
Cholesterol, Total: 108 mg/dL (ref 100–199)
HDL: 38 mg/dL — ABNORMAL LOW (ref >39)
LDL Chol Calc (NIH): 48 mg/dL (ref 0–99)
LDL/HDL Ratio: 1.3 ratio (ref 0.0–3.6)
Triglycerides: 120 mg/dL (ref 0–149)
VLDL Cholesterol Cal: 22 mg/dL (ref 5–40)

## 2021-04-28 LAB — MICROALBUMIN / CREATININE URINE RATIO
Creatinine, Urine: 388.5 mg/dL
Microalb/Creat Ratio: 18 mg/g creat (ref 0–29)
Microalbumin, Urine: 71.3 ug/mL

## 2021-04-28 LAB — HEMOGLOBIN A1C
Est. average glucose Bld gHb Est-mCnc: 214 mg/dL
Hgb A1c MFr Bld: 9.1 % — ABNORMAL HIGH (ref 4.8–5.6)

## 2021-04-28 LAB — TSH+FREE T4
Free T4: 1.39 ng/dL (ref 0.82–1.77)
TSH: 4.98 u[IU]/mL — ABNORMAL HIGH (ref 0.450–4.500)

## 2021-04-28 LAB — HEPATITIS C ANTIBODY: Hep C Virus Ab: <0.1 s/co ratio (ref 0.0–0.9)

## 2021-05-13 ENCOUNTER — Ambulatory Visit: Payer: HMO | Admitting: "Endocrinology

## 2021-05-13 ENCOUNTER — Encounter: Payer: Self-pay | Admitting: "Endocrinology

## 2021-05-13 ENCOUNTER — Other Ambulatory Visit: Payer: Self-pay

## 2021-05-13 VITALS — BP 144/76 | HR 72 | Ht 71.0 in | Wt 201.0 lb

## 2021-05-13 DIAGNOSIS — E039 Hypothyroidism, unspecified: Secondary | ICD-10-CM

## 2021-05-13 DIAGNOSIS — E782 Mixed hyperlipidemia: Secondary | ICD-10-CM | POA: Diagnosis not present

## 2021-05-13 DIAGNOSIS — E1165 Type 2 diabetes mellitus with hyperglycemia: Secondary | ICD-10-CM

## 2021-05-13 DIAGNOSIS — I10 Essential (primary) hypertension: Secondary | ICD-10-CM

## 2021-05-13 MED ORDER — TRULICITY 0.75 MG/0.5ML ~~LOC~~ SOAJ: 0.7500 mg | SUBCUTANEOUS | 2 refills | Status: DC

## 2021-05-13 NOTE — Patient Instructions (Signed)

## 2021-05-13 NOTE — Progress Notes (Signed)
Endocrinology Consult Note       05/13/2021, 11:15 AM   Subjective:    Patient ID: Jim Salazar, male    DOB: 01-09-1951.  Jim Salazar is being seen in consultation for management of currently uncontrolled symptomatic diabetes requested by  Lindell Spar, MD.   Past Medical History:  Diagnosis Date   CAD (coronary artery disease), native coronary artery    mild nonobstructive plaque in LCx and RCA and mildly obstructive minimally calcified plaque in mid LAD on coronary CTA 11/2017   Depression    Diabetes mellitus without complication (HCC)    GERD (gastroesophageal reflux disease)    Hypertension    Hypothyroidism, unspecified    Left sided numbness 09/17/2017   Major depressive disorder, single episode, unspecified    Need for immunization against influenza 06/12/2019   Need for vaccination against Streptococcus pneumoniae using pneumococcal conjugate vaccine 13 06/12/2019   Obesity, unspecified    Right upper quadrant abdominal pain 09/22/2019   Screening for prostate cancer 06/12/2019   Syncope 10/17/2017   Thyroid disease    Tremor, essential 11/30/2017   Type 2 diabetes mellitus with hyperglycemia (HCC)     Past Surgical History:  Procedure Laterality Date   APPENDECTOMY     COLONOSCOPY     TONSILLECTOMY      Social History   Socioeconomic History   Marital status: Married    Spouse name: [name]    Number of children: 2   Years of education: College   Highest education level: Not on file  Occupational History   Occupation: semi retired     Comment: Theme park manager   Tobacco Use   Smoking status: Never   Smokeless tobacco: Never  Vaping Use   Vaping Use: Never used  Substance and Sexual Activity   Alcohol use: No   Drug use: No   Sexual activity: Yes    Birth control/protection: None  Other Topics Concern   Not on file  Social History Narrative   Lives with 2nd wife  [name] -married 10 years      3 dogs: 2 inside and 1 outside: Skimp; Ginger; Eve      Enjoys: model railroad Museum/gallery curator, Chiropodist some      Diet: eats all food groups -raisins    Caffeine use: 1-2 cups decaf coffee daily, diet coke sometimes   Water: 4-5 bottles a day       Wears seat belt   Does not use phone while driving-hands free    Oceanographer at home   Weapons in lock box   Social Determinants of Radio broadcast assistant Strain: Low Risk    Difficulty of Paying Living Expenses: Not hard at all  Food Insecurity: No Food Insecurity   Worried About Charity fundraiser in the Last Year: Never true   Arboriculturist in the Last Year: Never true  Transportation Needs: No Transportation Needs   Lack of Transportation (Medical): No   Lack of Transportation (Non-Medical): No  Physical Activity: Sufficiently Active   Days of Exercise per Week: 6 days   Minutes of Exercise  per Session: 40 min  Stress: No Stress Concern Present   Feeling of Stress : Not at all  Social Connections: Socially Integrated   Frequency of Communication with Friends and Family: More than three times a week   Frequency of Social Gatherings with Friends and Family: Three times a week   Attends Religious Services: More than 4 times per year   Active Member of Clubs or Organizations: Yes   Attends Music therapist: More than 4 times per year   Marital Status: Married    Family History  Problem Relation Age of Onset   Kidney disease Mother    Hypertension Mother    Thyroid disease Mother    Diabetes Mellitus II Mother    Hyperlipidemia Mother    Stroke Mother    Hypertension Father    Cancer Father        lung   Hypertension Other    Colon cancer Neg Hx    Colon polyps Neg Hx    Esophageal cancer Neg Hx    Rectal cancer Neg Hx    Stomach cancer Neg Hx     Outpatient Encounter Medications as of 05/13/2021  Medication Sig   Dulaglutide (TRULICITY) 2.44 WN/0.2VO SOPN Inject 0.75  mg into the skin once a week.   Accu-Chek FastClix Lancets MISC    amLODipine (NORVASC) 10 MG tablet TAKE 1 TABLET BY MOUTH AT BEDTIME   aspirin EC 81 MG tablet Take 81 mg by mouth every evening.   atorvastatin (LIPITOR) 10 MG tablet TAKE 1 TABLET BY MOUTH ONCE A DAY   cetirizine (ZYRTEC) 10 MG tablet Take 1 tablet (10 mg total) by mouth daily.   fluticasone (FLONASE) 50 MCG/ACT nasal spray Place 2 sprays into both nostrils daily.   FREESTYLE LITE test strip    hydrochlorothiazide (MICROZIDE) 12.5 MG capsule Take 1 capsule (12.5 mg total) by mouth daily. (Patient not taking: Reported on 05/13/2021)   levothyroxine (SYNTHROID) 125 MCG tablet TAKE 1 TABLET BY MOUTH ONCE A DAY   lisinopril (ZESTRIL) 40 MG tablet TAKE 1 TABLET BY MOUTH DAILY.   metFORMIN (GLUCOPHAGE) 1000 MG tablet TAKE 1 TABLET BY MOUTH 2 TIMES DAILY WITH A MEAL.   PARoxetine (PAXIL) 20 MG tablet TAKE 1 TABLET BY MOUTH DAILY.   [DISCONTINUED] Dulaglutide (TRULICITY) 1.5 ZD/6.6YQ SOPN Inject 1.5 mg into the skin once a week.   [DISCONTINUED] JANUMET 50-1000 MG tablet Take 1 tablet by mouth 2 (two) times daily.   [DISCONTINUED] penicillin v potassium (VEETID) 500 MG tablet Take 500 mg by mouth 3 (three) times daily.   No facility-administered encounter medications on file as of 05/13/2021.    ALLERGIES: No Known Allergies  VACCINATION STATUS: Immunization History  Administered Date(s) Administered   Fluad Quad(high Dose 65+) 04/29/2020, 04/26/2021   Influenza Inj Mdck Quad Pf 05/23/2016   Influenza Inj Mdck Quad With Preservative 05/24/2017   Influenza, High Dose Seasonal PF 05/25/2018   Influenza,inj,Quad PF,6+ Mos 06/12/2019   Influenza-Unspecified 08/04/2012, 04/22/2013, 05/24/2017   Janssen (J&J) SARS-COV-2 Vaccination 10/17/2019   Pneumococcal Conjugate-13 06/12/2019   Pneumococcal Polysaccharide-23 07/20/2017    Diabetes He presents for his initial diabetic visit. He has type 2 diabetes mellitus. Onset time: He  was diagnosed at approximate age of 53 years. His disease course has been worsening. There are no hypoglycemic associated symptoms. Pertinent negatives for hypoglycemia include no confusion, headaches, pallor or seizures. Associated symptoms include polydipsia and polyuria. Pertinent negatives for diabetes include no chest pain, no fatigue,  no polyphagia and no weakness. There are no hypoglycemic complications. Symptoms are worsening. There are no diabetic complications. Risk factors for coronary artery disease include diabetes mellitus, dyslipidemia, family history, hypertension and sedentary lifestyle. Current diabetic treatments: Is currently on Trulicity 1.5 mg subcutaneously weekly, metformin 1000 mg p.o. twice daily. His weight is decreasing steadily (He reports progressive weight loss over the years.). He is following a generally unhealthy diet. When asked about meal planning, he reported none. He has not had a previous visit with a dietitian. He participates in exercise intermittently. His home blood glucose trend is fluctuating minimally. His overall blood glucose range is >200 mg/dl. (He did not bring any logs nor meter, reports his readings are usually above 200 mg/day.  His recent A1c was 9.1%.) An ACE inhibitor/angiotensin II receptor blocker is being taken. Eye exam is current.  Hyperlipidemia This is a chronic problem. The current episode started more than 1 year ago. Exacerbating diseases include diabetes. Pertinent negatives include no chest pain, myalgias or shortness of breath. Current antihyperlipidemic treatment includes statins. Risk factors for coronary artery disease include dyslipidemia, diabetes mellitus, male sex and family history.  Hypertension This is a chronic problem. The current episode started more than 1 year ago. Pertinent negatives include no chest pain, headaches, neck pain, palpitations or shortness of breath. Risk factors for coronary artery disease include dyslipidemia,  diabetes mellitus, family history, male gender and sedentary lifestyle. Past treatments include ACE inhibitors.    Review of Systems  Constitutional:  Negative for chills, fatigue, fever and unexpected weight change.  HENT:  Negative for dental problem, mouth sores and trouble swallowing.   Eyes:  Negative for visual disturbance.  Respiratory:  Negative for cough, choking, chest tightness, shortness of breath and wheezing.   Cardiovascular:  Negative for chest pain, palpitations and leg swelling.  Gastrointestinal:  Negative for abdominal distention, abdominal pain, constipation, diarrhea, nausea and vomiting.  Endocrine: Positive for polydipsia and polyuria. Negative for polyphagia.  Genitourinary:  Negative for dysuria, flank pain, hematuria and urgency.  Musculoskeletal:  Negative for back pain, gait problem, myalgias and neck pain.  Skin:  Negative for pallor, rash and wound.  Neurological:  Negative for seizures, syncope, weakness, numbness and headaches.  Psychiatric/Behavioral:  Negative for confusion and dysphoric mood.    Objective:    Vitals with BMI 05/13/2021 04/26/2021 04/10/2021  Height 5\' 11"  5\' 11"  -  Weight 201 lbs 198 lbs 2 oz -  BMI 16.10 96.04 -  Systolic 540 981 191  Diastolic 76 77 82  Pulse 72 93 100    BP (!) 144/76   Pulse 72   Ht 5\' 11"  (1.803 m)   Wt 201 lb (91.2 kg)   BMI 28.03 kg/m   Wt Readings from Last 3 Encounters:  05/13/21 201 lb (91.2 kg)  04/26/21 198 lb 1.9 oz (89.9 kg)  04/09/21 205 lb (93 kg)     Physical Exam Constitutional:      General: He is not in acute distress.    Appearance: He is well-developed.  HENT:     Head: Normocephalic and atraumatic.  Neck:     Thyroid: No thyromegaly.     Trachea: No tracheal deviation.  Cardiovascular:     Rate and Rhythm: Normal rate.     Pulses:          Dorsalis pedis pulses are 1+ on the right side and 1+ on the left side.       Posterior tibial pulses are 1+  on the right side and 1+ on  the left side.     Heart sounds: Normal heart sounds, S1 normal and S2 normal. No murmur heard.   No gallop.  Pulmonary:     Effort: No respiratory distress.     Breath sounds: Normal breath sounds. No wheezing.  Abdominal:     General: Bowel sounds are normal. There is no distension.     Palpations: Abdomen is soft.     Tenderness: There is no abdominal tenderness. There is no guarding.  Musculoskeletal:     Right shoulder: No swelling or deformity.     Cervical back: Normal range of motion and neck supple.     Comments: Dry skin on his bilateral feet.  Diminished pulses on dorsalis pedis bilaterally.  Skin:    General: Skin is warm and dry.     Findings: No rash.     Nails: There is no clubbing.  Neurological:     Mental Status: He is alert and oriented to person, place, and time.     Cranial Nerves: No cranial nerve deficit.     Sensory: No sensory deficit.     Gait: Gait normal.     Deep Tendon Reflexes: Reflexes are normal and symmetric.     Comments: Monofilament test is normal on bilateral lower extremities.  Psychiatric:        Speech: Speech normal.        Behavior: Behavior normal. Behavior is cooperative.        Thought Content: Thought content normal.        Judgment: Judgment normal.      CMP ( most recent) CMP     Component Value Date/Time   NA 137 04/26/2021 0958   K 4.4 04/26/2021 0958   CL 95 (L) 04/26/2021 0958   CO2 21 04/26/2021 0958   GLUCOSE 320 (H) 04/26/2021 0958   GLUCOSE 302 (H) 04/09/2021 2059   BUN 15 04/26/2021 0958   CREATININE 1.05 04/26/2021 0958   CREATININE 1.02 06/22/2020 0847   CALCIUM 9.4 04/26/2021 0958   PROT 6.7 04/26/2021 0958   ALBUMIN 4.2 04/26/2021 0958   AST 19 04/26/2021 0958   ALT 15 04/26/2021 0958   ALKPHOS 85 04/26/2021 0958   BILITOT 0.7 04/26/2021 0958   GFRNONAA >60 04/09/2021 2059   GFRNONAA 76 06/22/2020 0847   GFRAA 88 06/22/2020 0847     Diabetic Labs (most recent): Lab Results  Component Value Date    HGBA1C 9.1 (H) 04/26/2021   HGBA1C 7.8 (A) 12/23/2020   HGBA1C 8.3 (A) 09/22/2020     Lipid Panel ( most recent) Lipid Panel     Component Value Date/Time   CHOL 108 04/26/2021 0958   TRIG 120 04/26/2021 0958   HDL 38 (L) 04/26/2021 0958   CHOLHDL 2.4 09/22/2020 0923   CHOLHDL 3.0 09/23/2019 0746   VLDL 25 09/17/2017 0843   LDLCALC 48 04/26/2021 0958   LDLCALC 47 09/23/2019 0746   LABVLDL 22 04/26/2021 0958      Lab Results  Component Value Date   TSH 4.980 (H) 04/26/2021   TSH 0.322 (L) 04/09/2021   TSH 1.240 09/22/2020   TSH 7.26 (H) 09/23/2019   TSH 2.643 09/17/2017   FREET4 1.39 04/26/2021   FREET4 1.40 09/22/2020      Assessment & Plan:   1. Poorly controlled type 2 diabetes mellitus (HCC)    - Jim Salazar has currently uncontrolled symptomatic type 2 DM since  70 years  of age,  with most recent A1c of 9.1 %. Recent labs reviewed. He did not bring any logs nor meter, reports his readings are usually above 200 mg/day.    - I had a long discussion with him about the progressive nature of diabetes and the pathology behind its complications. -He does not report gross complications from diabetes, however, he remains at a high risk for more acute and chronic complications which include CAD, CVA, CKD, retinopathy, and neuropathy. These are all discussed in detail with him.  - I have counseled him on diet  and weight management  by adopting a carbohydrate restricted/protein rich diet. Patient is encouraged to switch to  unprocessed or minimally processed     complex starch and increased protein intake (animal or plant source), fruits, and vegetables. -  he is advised to stick to a routine mealtimes to eat 3 meals  a day and avoid unnecessary snacks ( to snack only to correct hypoglycemia).   - he acknowledges that there is a room for improvement in his food and drink choices. - Suggestion is made for him to avoid simple carbohydrates  from his diet including  Cakes, Sweet Desserts, Ice Cream, Soda (diet and regular), Sweet Tea, Candies, Chips, Cookies, Store Bought Juices, Alcohol in Excess of  1-2 drinks a day, Artificial Sweeteners,  Coffee Creamer, and "Sugar-free" Products. This will help patient to have more stable blood glucose profile and potentially avoid unintended weight gain.  - he will be scheduled with Jearld Fenton, RDN, CDE for diabetes education.  - I have approached him with the following individualized plan to manage  his diabetes and patient agrees:   -He has a lot of options to treat his diabetes, however the best option is significant change in his lifestyle.  Detailed discussion and recommendation on whole food plant-based lifestyle nutrition for him.   -He reports some side effects from his current dose of Trulicity, advised to lower it to 0.75 mg subcutaneously weekly. -He is approached to monitor blood glucose 4 times a day-before meals and at bedtime and return in 10 days with his meter and logs. - he is encouraged to call clinic for blood glucose levels less than 70 or above 200 mg /dl. - he is advised to continue metformin 1000 mg p.o. twice daily, therapeutically suitable for patient .   - Specific targets for  A1c;  LDL, HDL,  and Triglycerides were discussed with the patient.  2) Blood Pressure /Hypertension:  his blood pressure is not controlled to target.   he is advised to continue his current medications including amlodipine 10 mg p.o. daily, lisinopril 40 mg p.o. daily at breakfast.   3) Lipids/Hyperlipidemia:   Review of his recent lipid panel showed  controlled  LDL at 48 .  he  is advised to continue    atorvastatin 10 mg daily at bedtime.  Side effects and precautions discussed with him.  4)  Weight/Diet:  Body mass index is 28.03 kg/m.  -   clearly complicating his diabetes care.   he is  a candidate for weight loss. I discussed with him the fact that loss of 5 - 10% of his  current body weight will have the  most impact on his diabetes management.  Exercise, and detailed carbohydrates information provided  -  detailed on discharge instructions.  5) hypothyroidism-the circumstance of his diagnosis are not available to review. His TSH in October was higher than target at 4.9, however free T4 was  consistent with appropriate replacement at 1.39. He is currently on Synthroid 125 mcg p.o. daily before breakfast.  6) Chronic Care/Health Maintenance:  -he  is on ACEI/ARB and Statin medications and  is encouraged to initiate and continue to follow up with Ophthalmology, Dentist,  Podiatrist at least yearly or according to recommendations, and advised to   stay away from smoking. I have recommended yearly flu vaccine and pneumonia vaccine at least every 5 years; moderate intensity exercise for up to 150 minutes weekly; and  sleep for at least 7 hours a day.  - he is  advised to maintain close follow up with Lindell Spar, MD for primary care needs, as well as his other providers for optimal and coordinated care.   I spent 66 minutes in the care of the patient today including review of labs from [place], Lipids, Thyroid Function, Hematology (current and previous including abstractions from other facilities); face-to-face time discussing  his blood glucose readings/logs, discussing hypoglycemia and hyperglycemia episodes and symptoms, medications doses, his options of short and long term treatment based on the latest standards of care / guidelines;  discussion about incorporating lifestyle medicine;  and documenting the encounter.     Please refer to Patient Instructions for Blood Glucose Monitoring and Insulin/Medications Dosing Guide"  in media tab for additional information. Please  also refer to " Patient Self Inventory" in the Media  tab for reviewed elements of pertinent patient history.  Jim Salazar participated in the discussions, expressed understanding, and voiced agreement with the above plans.  All  questions were answered to his satisfaction. he is encouraged to contact clinic should he have any questions or concerns prior to his return visit.   Follow up plan: - Return in about 10 days (around 05/23/2021) for F/U with Meter and Logs Only - no Labs.  Jim Lloyd, MD [facility] [address] [place], [place] [address] Phone: [phone]  Fax: [phone]    05/13/2021, 11:15 AM  This note was partially dictated with voice recognition software. Similar sounding words can be transcribed inadequately or may not  be corrected upon review.

## 2021-05-23 ENCOUNTER — Other Ambulatory Visit: Payer: Self-pay

## 2021-05-23 ENCOUNTER — Ambulatory Visit: Payer: HMO | Admitting: "Endocrinology

## 2021-05-23 ENCOUNTER — Encounter: Payer: Self-pay | Admitting: "Endocrinology

## 2021-05-23 VITALS — BP 130/74 | HR 72 | Ht 71.0 in | Wt 198.8 lb

## 2021-05-23 DIAGNOSIS — E782 Mixed hyperlipidemia: Secondary | ICD-10-CM | POA: Diagnosis not present

## 2021-05-23 DIAGNOSIS — I10 Essential (primary) hypertension: Secondary | ICD-10-CM | POA: Diagnosis not present

## 2021-05-23 DIAGNOSIS — E039 Hypothyroidism, unspecified: Secondary | ICD-10-CM | POA: Diagnosis not present

## 2021-05-23 DIAGNOSIS — E1165 Type 2 diabetes mellitus with hyperglycemia: Secondary | ICD-10-CM

## 2021-05-23 NOTE — Patient Instructions (Signed)

## 2021-05-23 NOTE — Progress Notes (Signed)
05/13/2021, 11:15 AM  Endocrinology follow-up note   Subjective:    Patient ID: Jim Salazar, male    DOB: 04-03-1951.  Jim Salazar is being seen in follow-up after he was seen in consultation for management of currently uncontrolled symptomatic diabetes requested by  Lindell Spar, MD.   Past Medical History:  Diagnosis Date   CAD (coronary artery disease), native coronary artery    mild nonobstructive plaque in LCx and RCA and mildly obstructive minimally calcified plaque in mid LAD on coronary CTA 11/2017   Depression    Diabetes mellitus without complication (HCC)    GERD (gastroesophageal reflux disease)    Hypertension    Hypothyroidism, unspecified    Left sided numbness 09/17/2017   Major depressive disorder, single episode, unspecified    Need for immunization against influenza 06/12/2019   Need for vaccination against Streptococcus pneumoniae using pneumococcal conjugate vaccine 13 06/12/2019   Obesity, unspecified    Right upper quadrant abdominal pain 09/22/2019   Screening for prostate cancer 06/12/2019   Syncope 10/17/2017   Thyroid disease    Tremor, essential 11/30/2017   Type 2 diabetes mellitus with hyperglycemia (HCC)     Past Surgical History:  Procedure Laterality Date   APPENDECTOMY     COLONOSCOPY     TONSILLECTOMY      Social History   Socioeconomic History   Marital status: Married    Spouse name: [name]    Number of children: 2   Years of education: College   Highest education level: Not on file  Occupational History   Occupation: semi retired     Comment: Theme park manager   Tobacco Use   Smoking status: Never   Smokeless tobacco: Never  Vaping Use   Vaping Use: Never used  Substance and Sexual Activity   Alcohol use: No   Drug use: No   Sexual activity: Yes    Birth control/protection: None  Other Topics Concern   Not on file  Social History Narrative   Lives with 2nd wife [name] -married 10 years      3  dogs: 2 inside and 1 outside: Skimp; Ginger; Eve      Enjoys: model railroad Museum/gallery curator, Chiropodist some      Diet: eats all food groups -raisins    Caffeine use: 1-2 cups decaf coffee daily, diet coke sometimes   Water: 4-5 bottles a day       Wears seat belt   Does not use phone while driving-hands free    Oceanographer at home   Weapons in lock box   Social Determinants of Radio broadcast assistant Strain: Low Risk    Difficulty of Paying Living Expenses: Not hard at all  Food Insecurity: No Food Insecurity   Worried About Charity fundraiser in the Last Year: Never true   Arboriculturist in the Last Year: Never true  Transportation Needs: No Transportation Needs   Lack of Transportation (Medical): No   Lack of Transportation (Non-Medical): No  Physical Activity: Sufficiently Active   Days of Exercise per Week: 6 days   Minutes of Exercise per Session: 40 min  Stress: No Stress Concern Present   Feeling of Stress : Not at all  Social Connections: Socially Integrated   Frequency of Communication with Friends and Family: More than three times a week   Frequency of Social Gatherings with Friends and Family: Three times a week   Attends  Religious Services: More than 4 times per year   Active Member of Clubs or Organizations: Yes   Attends Archivist Meetings: More than 4 times per year   Marital Status: Married    Family History  Problem Relation Age of Onset   Kidney disease Mother    Hypertension Mother    Thyroid disease Mother    Diabetes Mellitus II Mother    Hyperlipidemia Mother    Stroke Mother    Hypertension Father    Cancer Father        lung   Hypertension Other    Colon cancer Neg Hx    Colon polyps Neg Hx    Esophageal cancer Neg Hx    Rectal cancer Neg Hx    Stomach cancer Neg Hx     Outpatient Encounter Medications as of 05/13/2021  Medication Sig   Dulaglutide (TRULICITY) 3.76 EG/3.1DV SOPN Inject 0.75 mg into the skin once a week.    Accu-Chek FastClix Lancets MISC    amLODipine (NORVASC) 10 MG tablet TAKE 1 TABLET BY MOUTH AT BEDTIME   aspirin EC 81 MG tablet Take 81 mg by mouth every evening.   atorvastatin (LIPITOR) 10 MG tablet TAKE 1 TABLET BY MOUTH ONCE A DAY   cetirizine (ZYRTEC) 10 MG tablet Take 1 tablet (10 mg total) by mouth daily.   fluticasone (FLONASE) 50 MCG/ACT nasal spray Place 2 sprays into both nostrils daily.   FREESTYLE LITE test strip    hydrochlorothiazide (MICROZIDE) 12.5 MG capsule Take 1 capsule (12.5 mg total) by mouth daily. (Patient not taking: Reported on 05/13/2021)   levothyroxine (SYNTHROID) 125 MCG tablet TAKE 1 TABLET BY MOUTH ONCE A DAY   lisinopril (ZESTRIL) 40 MG tablet TAKE 1 TABLET BY MOUTH DAILY.   metFORMIN (GLUCOPHAGE) 1000 MG tablet TAKE 1 TABLET BY MOUTH 2 TIMES DAILY WITH A MEAL.   PARoxetine (PAXIL) 20 MG tablet TAKE 1 TABLET BY MOUTH DAILY.   [DISCONTINUED] Dulaglutide (TRULICITY) 1.5 VO/1.6WV SOPN Inject 1.5 mg into the skin once a week.   [DISCONTINUED] JANUMET 50-1000 MG tablet Take 1 tablet by mouth 2 (two) times daily.   [DISCONTINUED] penicillin v potassium (VEETID) 500 MG tablet Take 500 mg by mouth 3 (three) times daily.   No facility-administered encounter medications on file as of 05/13/2021.    ALLERGIES: No Known Allergies  VACCINATION STATUS: Immunization History  Administered Date(s) Administered   Fluad Quad(high Dose 65+) 04/29/2020, 04/26/2021   Influenza Inj Mdck Quad Pf 05/23/2016   Influenza Inj Mdck Quad With Preservative 05/24/2017   Influenza, High Dose Seasonal PF 05/25/2018   Influenza,inj,Quad PF,6+ Mos 06/12/2019   Influenza-Unspecified 08/04/2012, 04/22/2013, 05/24/2017   Janssen (J&J) SARS-COV-2 Vaccination 10/17/2019   Pneumococcal Conjugate-13 06/12/2019   Pneumococcal Polysaccharide-23 07/20/2017    Diabetes He presents for his follow-up diabetic visit. He has type 2 diabetes mellitus. Onset time: He was diagnosed at approximate  age of 89 years. His disease course has been improving. There are no hypoglycemic associated symptoms. Pertinent negatives for hypoglycemia include no confusion, headaches, pallor or seizures. Pertinent negatives for diabetes include no chest pain, no fatigue, no polydipsia, no polyphagia, no polyuria and no weakness. There are no hypoglycemic complications. Symptoms are improving. There are no diabetic complications. Risk factors for coronary artery disease include diabetes mellitus, dyslipidemia, family history, hypertension and sedentary lifestyle. Current diabetic treatments: Is currently on Trulicity 1.5 mg subcutaneously weekly, metformin 1000 mg p.o. twice daily. His weight is decreasing steadily (He reports  progressive weight loss over the years.). He is following a generally unhealthy diet. When asked about meal planning, he reported none. He has not had a previous visit with a dietitian. He participates in exercise intermittently. His home blood glucose trend is decreasing steadily. His breakfast blood glucose range is generally 130-140 mg/dl. His lunch blood glucose range is generally 130-140 mg/dl. His dinner blood glucose range is generally 130-140 mg/dl. His bedtime blood glucose range is generally 130-140 mg/dl. His overall blood glucose range is 130-140 mg/dl. (He presents with significant improvement in his glycemic profile averaging 131 over the last 7 days.  No hypoglycemia was documented.  His previsit A1c was 9.1% prior to his last visit.   ) An ACE inhibitor/angiotensin II receptor blocker is being taken. Eye exam is current.  Hyperlipidemia This is a chronic problem. The current episode started more than 1 year ago. Exacerbating diseases include diabetes. Pertinent negatives include no chest pain, myalgias or shortness of breath. Current antihyperlipidemic treatment includes statins. Risk factors for coronary artery disease include dyslipidemia, diabetes mellitus, male sex and family  history.  Hypertension This is a chronic problem. The current episode started more than 1 year ago. Pertinent negatives include no chest pain, headaches, neck pain, palpitations or shortness of breath. Risk factors for coronary artery disease include dyslipidemia, diabetes mellitus, family history, male gender and sedentary lifestyle. Past treatments include ACE inhibitors.    Review of Systems  Constitutional:  Negative for chills, fatigue, fever and unexpected weight change.  HENT:  Negative for dental problem, mouth sores and trouble swallowing.   Eyes:  Negative for visual disturbance.  Respiratory:  Negative for cough, choking, chest tightness, shortness of breath and wheezing.   Cardiovascular:  Negative for chest pain, palpitations and leg swelling.  Gastrointestinal:  Negative for abdominal distention, abdominal pain, constipation, diarrhea, nausea and vomiting.  Endocrine: Negative for polydipsia, polyphagia and polyuria.  Genitourinary:  Negative for dysuria, flank pain, hematuria and urgency.  Musculoskeletal:  Negative for back pain, gait problem, myalgias and neck pain.  Skin:  Negative for pallor, rash and wound.  Neurological:  Negative for seizures, syncope, weakness, numbness and headaches.  Psychiatric/Behavioral:  Negative for confusion and dysphoric mood.    Objective:    Vitals with BMI 05/13/2021 04/26/2021 04/10/2021  Height 5\' 11"  5\' 11"  -  Weight 201 lbs 198 lbs 2 oz -  BMI 17.61 60.73 -  Systolic 710 626 948  Diastolic 76 77 82  Pulse 72 93 100    BP (!) 144/76   Pulse 72   Ht 5\' 11"  (1.803 m)   Wt 201 lb (91.2 kg)   BMI 28.03 kg/m   Wt Readings from Last 3 Encounters:  05/13/21 201 lb (91.2 kg)  04/26/21 198 lb 1.9 oz (89.9 kg)  04/09/21 205 lb (93 kg)     Physical Exam Constitutional:      General: He is not in acute distress.    Appearance: He is well-developed.  HENT:     Head: Normocephalic and atraumatic.  Neck:     Thyroid: No  thyromegaly.     Trachea: No tracheal deviation.  Cardiovascular:     Rate and Rhythm: Normal rate.     Pulses:          Dorsalis pedis pulses are 1+ on the right side and 1+ on the left side.       Posterior tibial pulses are 1+ on the right side and 1+ on the left side.  Heart sounds: Normal heart sounds, S1 normal and S2 normal. No murmur heard.   No gallop.  Pulmonary:     Effort: No respiratory distress.     Breath sounds: Normal breath sounds. No wheezing.  Abdominal:     General: Bowel sounds are normal. There is no distension.     Palpations: Abdomen is soft.     Tenderness: There is no abdominal tenderness. There is no guarding.  Musculoskeletal:     Right shoulder: No swelling or deformity.     Cervical back: Normal range of motion and neck supple.     Comments: Dry skin on his bilateral feet.  Diminished pulses on dorsalis pedis bilaterally.  Skin:    General: Skin is warm and dry.     Findings: No rash.     Nails: There is no clubbing.  Neurological:     Mental Status: He is alert and oriented to person, place, and time.     Cranial Nerves: No cranial nerve deficit.     Sensory: No sensory deficit.     Gait: Gait normal.     Deep Tendon Reflexes: Reflexes are normal and symmetric.     Comments: Monofilament test is normal on bilateral lower extremities.  Psychiatric:        Speech: Speech normal.        Behavior: Behavior normal. Behavior is cooperative.        Thought Content: Thought content normal.        Judgment: Judgment normal.      CMP ( most recent) CMP     Component Value Date/Time   NA 137 04/26/2021 0958   K 4.4 04/26/2021 0958   CL 95 (L) 04/26/2021 0958   CO2 21 04/26/2021 0958   GLUCOSE 320 (H) 04/26/2021 0958   GLUCOSE 302 (H) 04/09/2021 2059   BUN 15 04/26/2021 0958   CREATININE 1.05 04/26/2021 0958   CREATININE 1.02 06/22/2020 0847   CALCIUM 9.4 04/26/2021 0958   PROT 6.7 04/26/2021 0958   ALBUMIN 4.2 04/26/2021 0958   AST 19  04/26/2021 0958   ALT 15 04/26/2021 0958   ALKPHOS 85 04/26/2021 0958   BILITOT 0.7 04/26/2021 0958   GFRNONAA >60 04/09/2021 2059   GFRNONAA 76 06/22/2020 0847   GFRAA 88 06/22/2020 0847     Diabetic Labs (most recent): Lab Results  Component Value Date   HGBA1C 9.1 (H) 04/26/2021   HGBA1C 7.8 (A) 12/23/2020   HGBA1C 8.3 (A) 09/22/2020     Lipid Panel ( most recent) Lipid Panel     Component Value Date/Time   CHOL 108 04/26/2021 0958   TRIG 120 04/26/2021 0958   HDL 38 (L) 04/26/2021 0958   CHOLHDL 2.4 09/22/2020 0923   CHOLHDL 3.0 09/23/2019 0746   VLDL 25 09/17/2017 0843   LDLCALC 48 04/26/2021 0958   LDLCALC 47 09/23/2019 0746   LABVLDL 22 04/26/2021 0958      Lab Results  Component Value Date   TSH 4.980 (H) 04/26/2021   TSH 0.322 (L) 04/09/2021   TSH 1.240 09/22/2020   TSH 7.26 (H) 09/23/2019   TSH 2.643 09/17/2017   FREET4 1.39 04/26/2021   FREET4 1.40 09/22/2020      Assessment & Plan:   1. Poorly controlled type 2 diabetes mellitus (HCC)    - Jim Salazar has currently uncontrolled symptomatic type 2 DM since  70 years of age.  He presents with significant improvement in his glycemic profile averaging 131 over  the last 7 days.  No hypoglycemia was documented.  His previsit A1c was 9.1% prior to his last visit.    - I had a long discussion with him about the progressive nature of diabetes and the pathology behind its complications. -He does not report gross complications from diabetes, however, he remains at a high risk for more acute and chronic complications which include CAD, CVA, CKD, retinopathy, and neuropathy. These are all discussed in detail with him.  - I have counseled him on diet  and weight management  by adopting a carbohydrate restricted/protein rich diet. Patient is encouraged to switch to  unprocessed or minimally processed     complex starch and increased protein intake (animal or plant source), fruits, and vegetables. -  he is  advised to stick to a routine mealtimes to eat 3 meals  a day and avoid unnecessary snacks ( to snack only to correct hypoglycemia).   - he acknowledges that there is a room for improvement in his food and drink choices. - Suggestion is made for him to avoid simple carbohydrates  from his diet including Cakes, Sweet Desserts, Ice Cream, Soda (diet and regular), Sweet Tea, Candies, Chips, Cookies, Store Bought Juices, Alcohol in Excess of  1-2 drinks a day, Artificial Sweeteners,  Coffee Creamer, and "Sugar-free" Products, Lemonade. This will help patient to have more stable blood glucose profile and potentially avoid unintended weight gain.   - he will be scheduled with Jearld Fenton, RDN, CDE for diabetes education.  - I have approached him with the following individualized plan to manage  his diabetes and patient agrees:   -He has a lot of options to treat his diabetes, however the best option is continued significant change in his lifestyle.  He seems to have engaged properly and achieved euglycemia with less medications than before.     -He reports some side effects from the higher dose of Trulicity, advised to continue Trulicity 7.67 mg subcutaneously weekly.   -He is approached to monitor blood glucose 2 times daily-daily before breakfast and at bedtime.   - he is encouraged to call clinic for blood glucose levels less than 70 or above 200 mg /dl. - he is advised to continue metformin 1000 mg p.o. twice daily, therapeutically suitable for patient .   - Specific targets for  A1c;  LDL, HDL,  and Triglycerides were discussed with the patient.  2) Blood Pressure /Hypertension: His blood pressure is controlled to target.  The recommended lifestyle nutrition is helping the blood pressure towards normal at 130/74.    he is advised to continue his current medications including amlodipine 10 mg p.o. daily, lisinopril 40 mg p.o. daily at breakfast.   3) Lipids/Hyperlipidemia:   Review of his  recent lipid panel showed  controlled  LDL at 48 .  he  is advised to continue    atorvastatin 10 mg daily at bedtime.  Side effects and precautions discussed with him.  4)  Weight/Diet:  Body mass index is 28.03 kg/m.  -      he is  a candidate for some more weight loss. I discussed with him the fact that loss of 5 - 10% of his  current body weight will have the most impact on his diabetes management.  Exercise, and detailed carbohydrates information provided  -  detailed on discharge instructions.  5) hypothyroidism-the circumstance of his diagnosis are not available to review. His TSH in October was higher than target at 4.9, however free  T4 was consistent with appropriate replacement at 1.39. He is currently on Synthroid 125 mcg p.o. daily before breakfast.  - We discussed about the correct intake of his thyroid hormone, on empty stomach at fasting, with water, separated by at least 30 minutes from breakfast and other medications,  and separated by more than 4 hours from calcium, iron, multivitamins, acid reflux medications (PPIs). -Patient is made aware of the fact that thyroid hormone replacement is needed for life, dose to be adjusted by periodic monitoring of thyroid function tests.   6) Chronic Care/Health Maintenance:  -he  is on ACEI/ARB and Statin medications and  is encouraged to initiate and continue to follow up with Ophthalmology, Dentist,  Podiatrist at least yearly or according to recommendations, and advised to   stay away from smoking. I have recommended yearly flu vaccine and pneumonia vaccine at least every 5 years; moderate intensity exercise for up to 150 minutes weekly; and  sleep for at least 7 hours a day.  - he is  advised to maintain close follow up with Lindell Spar, MD for primary care needs, as well as his other providers for optimal and coordinated care.   I spent 45 minutes in the care of the patient today including review of labs from [facility], Lipids, Thyroid  Function, Hematology (current and previous including abstractions from other facilities); face-to-face time discussing  his blood glucose readings/logs, discussing hypoglycemia and hyperglycemia episodes and symptoms, medications doses, his options of short and long term treatment based on the latest standards of care / guidelines;  discussion about incorporating lifestyle medicine;  and documenting the encounter.    Please refer to Patient Instructions for Blood Glucose Monitoring and Insulin/Medications Dosing Guide"  in media tab for additional information. Please  also refer to " Patient Self Inventory" in the Media  tab for reviewed elements of pertinent patient history.  Mancel Parsons participated in the discussions, expressed understanding, and voiced agreement with the above plans.  All questions were answered to his satisfaction. he is encouraged to contact clinic should he have any questions or concerns prior to his return visit.    Follow up plan: - Return in about 10 days (around 05/23/2021) for F/U with Meter and Logs Only - no Labs.  Glade Lloyd, MD [facility] [address] [place], [place] [address] Phone: [phone]  Fax: [phone]    05/13/2021, 11:15 AM  This note was partially dictated with voice recognition software. Similar sounding words can be transcribed inadequately or may not  be corrected upon review.

## 2021-07-13 ENCOUNTER — Other Ambulatory Visit: Payer: Self-pay

## 2021-07-13 DIAGNOSIS — E039 Hypothyroidism, unspecified: Secondary | ICD-10-CM

## 2021-07-13 MED ORDER — LEVOTHYROXINE SODIUM 125 MCG PO TABS: ORAL_TABLET | Freq: Every day | ORAL | 1 refills | Status: DC

## 2021-07-14 ENCOUNTER — Other Ambulatory Visit: Payer: Self-pay | Admitting: "Endocrinology

## 2021-07-28 ENCOUNTER — Ambulatory Visit: Payer: HMO | Admitting: "Endocrinology

## 2021-07-28 ENCOUNTER — Encounter: Payer: Self-pay | Admitting: "Endocrinology

## 2021-07-28 ENCOUNTER — Other Ambulatory Visit: Payer: Self-pay

## 2021-07-28 VITALS — BP 128/76 | HR 72 | Ht 71.0 in | Wt 193.4 lb

## 2021-07-28 DIAGNOSIS — E119 Type 2 diabetes mellitus without complications: Secondary | ICD-10-CM | POA: Diagnosis not present

## 2021-07-28 DIAGNOSIS — E039 Hypothyroidism, unspecified: Secondary | ICD-10-CM

## 2021-07-28 DIAGNOSIS — E782 Mixed hyperlipidemia: Secondary | ICD-10-CM

## 2021-07-28 DIAGNOSIS — I10 Essential (primary) hypertension: Secondary | ICD-10-CM | POA: Diagnosis not present

## 2021-07-28 LAB — POCT GLYCOSYLATED HEMOGLOBIN (HGB A1C): HbA1c, POC (controlled diabetic range): 6.6 % (ref 0.0–7.0)

## 2021-07-28 NOTE — Progress Notes (Signed)
07/28/2021, 12:53 PM  Endocrinology follow-up note   Subjective:    Patient ID: Jim Salazar, male    DOB: 1950/11/08.  Jim Salazar is being seen in follow-up after he was seen in consultation for management of currently uncontrolled symptomatic diabetes requested by  Lindell Spar, MD.   Past Medical History:  Diagnosis Date   CAD (coronary artery disease), native coronary artery    mild nonobstructive plaque in LCx and RCA and mildly obstructive minimally calcified plaque in mid LAD on coronary CTA 11/2017   Depression    Diabetes mellitus without complication (HCC)    GERD (gastroesophageal reflux disease)    Hypertension    Hypothyroidism, unspecified    Left sided numbness 09/17/2017   Major depressive disorder, single episode, unspecified    Need for immunization against influenza 06/12/2019   Need for vaccination against Streptococcus pneumoniae using pneumococcal conjugate vaccine 13 06/12/2019   Obesity, unspecified    Right upper quadrant abdominal pain 09/22/2019   Screening for prostate cancer 06/12/2019   Syncope 10/17/2017   Thyroid disease    Tremor, essential 11/30/2017   Type 2 diabetes mellitus with hyperglycemia (HCC)     Past Surgical History:  Procedure Laterality Date   APPENDECTOMY     COLONOSCOPY     TONSILLECTOMY      Social History   Socioeconomic History   Marital status: Married    Spouse name: [name]    Number of children: 2   Years of education: College   Highest education level: Not on file  Occupational History   Occupation: semi retired     Comment: Theme park manager   Tobacco Use   Smoking status: Never   Smokeless tobacco: Never  Vaping Use   Vaping Use: Never used  Substance and Sexual Activity   Alcohol use: No   Drug use: No   Sexual activity: Yes    Birth control/protection: None  Other Topics Concern   Not on file  Social History Narrative   Lives with 2nd wife [name] -married 10 years      3  dogs: 2 inside and 1 outside: Skimp; Ginger; Eve      Enjoys: model railroad Museum/gallery curator, Chiropodist some      Diet: eats all food groups -raisins    Caffeine use: 1-2 cups decaf coffee daily, diet coke sometimes   Water: 4-5 bottles a day       Wears seat belt   Does not use phone while driving-hands free    Oceanographer at home   Weapons in lock box   Social Determinants of Radio broadcast assistant Strain: Low Risk    Difficulty of Paying Living Expenses: Not hard at all  Food Insecurity: No Food Insecurity   Worried About Charity fundraiser in the Last Year: Never true   Arboriculturist in the Last Year: Never true  Transportation Needs: No Transportation Needs   Lack of Transportation (Medical): No   Lack of Transportation (Non-Medical): No  Physical Activity: Sufficiently Active   Days of Exercise per Week: 6 days   Minutes of Exercise per Session: 40 min  Stress: No Stress Concern Present   Feeling of Stress : Not at all  Social Connections: Socially Integrated   Frequency of Communication with Friends and Family: More than three times a week   Frequency of Social Gatherings with Friends and Family: Three times a week   Attends  Religious Services: More than 4 times per year   Active Member of Clubs or Organizations: Yes   Attends Archivist Meetings: More than 4 times per year   Marital Status: Married    Family History  Problem Relation Age of Onset   Kidney disease Mother    Hypertension Mother    Thyroid disease Mother    Diabetes Mellitus II Mother    Hyperlipidemia Mother    Stroke Mother    Hypertension Father    Cancer Father        lung   Hypertension Other    Colon cancer Neg Hx    Colon polyps Neg Hx    Esophageal cancer Neg Hx    Rectal cancer Neg Hx    Stomach cancer Neg Hx     Outpatient Encounter Medications as of 07/28/2021  Medication Sig   Accu-Chek FastClix Lancets MISC    amLODipine (NORVASC) 10 MG tablet TAKE 1 TABLET BY  MOUTH AT BEDTIME   aspirin EC 81 MG tablet Take 81 mg by mouth every evening.   atorvastatin (LIPITOR) 10 MG tablet TAKE 1 TABLET BY MOUTH ONCE A DAY   cetirizine (ZYRTEC) 10 MG tablet Take 1 tablet (10 mg total) by mouth daily.   fluticasone (FLONASE) 50 MCG/ACT nasal spray Place 2 sprays into both nostrils daily.   FREESTYLE LITE test strip    levothyroxine (SYNTHROID) 125 MCG tablet TAKE 1 TABLET BY MOUTH ONCE A DAY   lisinopril (ZESTRIL) 40 MG tablet TAKE 1 TABLET BY MOUTH DAILY.   metFORMIN (GLUCOPHAGE) 1000 MG tablet TAKE 1 TABLET BY MOUTH 2 TIMES DAILY WITH A MEAL.   PARoxetine (PAXIL) 20 MG tablet TAKE 1 TABLET BY MOUTH DAILY.   [DISCONTINUED] hydrochlorothiazide (MICROZIDE) 12.5 MG capsule Take 1 capsule (12.5 mg total) by mouth daily. (Patient not taking: Reported on 05/13/2021)   [DISCONTINUED] JANUMET 50-1000 MG tablet Take 1 tablet by mouth 2 (two) times daily.   [DISCONTINUED] TRULICITY 3.84 YK/5.9DJ SOPN INJECT ONE SYRINGE SUBCUTANEOUSLY EVERY WEEK.   No facility-administered encounter medications on file as of 07/28/2021.    ALLERGIES: No Known Allergies  VACCINATION STATUS: Immunization History  Administered Date(s) Administered   Fluad Quad(high Dose 65+) 04/29/2020, 04/26/2021   Influenza Inj Mdck Quad Pf 05/23/2016   Influenza Inj Mdck Quad With Preservative 05/24/2017   Influenza, High Dose Seasonal PF 05/25/2018   Influenza,inj,Quad PF,6+ Mos 06/12/2019   Influenza-Unspecified 08/04/2012, 04/22/2013, 05/24/2017   Janssen (J&J) SARS-COV-2 Vaccination 10/17/2019   Pneumococcal Conjugate-13 06/12/2019   Pneumococcal Polysaccharide-23 07/20/2017    Diabetes He presents for his follow-up diabetic visit. He has type 2 diabetes mellitus. Onset time: He was diagnosed at approximate age of 36 years. His disease course has been improving. There are no hypoglycemic associated symptoms. Pertinent negatives for hypoglycemia include no confusion, headaches, pallor or  seizures. Pertinent negatives for diabetes include no chest pain, no fatigue, no polydipsia, no polyphagia, no polyuria and no weakness. There are no hypoglycemic complications. Symptoms are improving. There are no diabetic complications. Risk factors for coronary artery disease include diabetes mellitus, dyslipidemia, family history, hypertension and sedentary lifestyle. Current diabetic treatments: Is currently on Trulicity 1.5 mg subcutaneously weekly, metformin 1000 mg p.o. twice daily. His weight is decreasing steadily (He has achieved 25 pounds of weight loss since last year.). He is following a generally unhealthy diet. When asked about meal planning, he reported none. He has not had a previous visit with a dietitian. He participates in exercise  intermittently. His home blood glucose trend is decreasing steadily. His breakfast blood glucose range is generally 130-140 mg/dl. His overall blood glucose range is 130-140 mg/dl. (Mr. Salahuddin presents with continued improvement in his glycemic profile.  His meter average is 130-145 mg per DL in the last 30 days.  His point-of-care A1c is 6.6%, improving from 9.1%.  He did not have any hypoglycemia.  He has continued to feel better.   ) An ACE inhibitor/angiotensin II receptor blocker is being taken. Eye exam is current.  Hyperlipidemia This is a chronic problem. The current episode started more than 1 year ago. Exacerbating diseases include diabetes. Pertinent negatives include no chest pain, myalgias or shortness of breath. Current antihyperlipidemic treatment includes statins. Risk factors for coronary artery disease include dyslipidemia, diabetes mellitus, male sex and family history.  Hypertension This is a chronic problem. The current episode started more than 1 year ago. Pertinent negatives include no chest pain, headaches, neck pain, palpitations or shortness of breath. Risk factors for coronary artery disease include dyslipidemia, diabetes mellitus, family  history, male gender and sedentary lifestyle. Past treatments include ACE inhibitors.    Review of Systems  Constitutional:  Negative for chills, fatigue, fever and unexpected weight change.  HENT:  Negative for dental problem, mouth sores and trouble swallowing.   Eyes:  Negative for visual disturbance.  Respiratory:  Negative for cough, choking, chest tightness, shortness of breath and wheezing.   Cardiovascular:  Negative for chest pain, palpitations and leg swelling.  Gastrointestinal:  Negative for abdominal distention, abdominal pain, constipation, diarrhea, nausea and vomiting.  Endocrine: Negative for polydipsia, polyphagia and polyuria.  Genitourinary:  Negative for dysuria, flank pain, hematuria and urgency.  Musculoskeletal:  Negative for back pain, gait problem, myalgias and neck pain.  Skin:  Negative for pallor, rash and wound.  Neurological:  Negative for seizures, syncope, weakness, numbness and headaches.  Psychiatric/Behavioral:  Negative for confusion and dysphoric mood.    Objective:    Vitals with BMI 07/28/2021 05/23/2021 05/13/2021  Height 5\' 11"  5\' 11"  5\' 11"   Weight 193 lbs 6 oz 198 lbs 13 oz 201 lbs  BMI 26.99 85.46 27.03  Systolic 500 938 182  Diastolic 76 74 76  Pulse 72 72 72    BP 128/76    Pulse 72    Ht 5\' 11"  (1.803 m)    Wt 193 lb 6.4 oz (87.7 kg)    BMI 26.97 kg/m   Wt Readings from Last 3 Encounters:  07/28/21 193 lb 6.4 oz (87.7 kg)  05/23/21 198 lb 12.8 oz (90.2 kg)  05/13/21 201 lb (91.2 kg)       CMP ( most recent) CMP     Component Value Date/Time   NA 137 04/26/2021 0958   K 4.4 04/26/2021 0958   CL 95 (L) 04/26/2021 0958   CO2 21 04/26/2021 0958   GLUCOSE 320 (H) 04/26/2021 0958   GLUCOSE 302 (H) 04/09/2021 2059   BUN 15 04/26/2021 0958   CREATININE 1.05 04/26/2021 0958   CREATININE 1.02 06/22/2020 0847   CALCIUM 9.4 04/26/2021 0958   PROT 6.7 04/26/2021 0958   ALBUMIN 4.2 04/26/2021 0958   AST 19 04/26/2021 0958   ALT 15  04/26/2021 0958   ALKPHOS 85 04/26/2021 0958   BILITOT 0.7 04/26/2021 0958   GFRNONAA >60 04/09/2021 2059   GFRNONAA 76 06/22/2020 0847   GFRAA 88 06/22/2020 0847     Diabetic Labs (most recent): Lab Results  Component Value Date  HGBA1C 6.6 07/28/2021   HGBA1C 9.1 (H) 04/26/2021   HGBA1C 7.8 (A) 12/23/2020     Lipid Panel ( most recent) Lipid Panel     Component Value Date/Time   CHOL 108 04/26/2021 0958   TRIG 120 04/26/2021 0958   HDL 38 (L) 04/26/2021 0958   CHOLHDL 2.4 09/22/2020 0923   CHOLHDL 3.0 09/23/2019 0746   VLDL 25 09/17/2017 0843   LDLCALC 48 04/26/2021 0958   LDLCALC 47 09/23/2019 0746   LABVLDL 22 04/26/2021 0958      Lab Results  Component Value Date   TSH 4.980 (H) 04/26/2021   TSH 0.322 (L) 04/09/2021   TSH 1.240 09/22/2020   TSH 7.26 (H) 09/23/2019   TSH 2.643 09/17/2017   FREET4 1.39 04/26/2021   FREET4 1.40 09/22/2020      Assessment & Plan:   1. Poorly controlled type 2 diabetes mellitus (HCC)    - [place] has currently uncontrolled symptomatic type 2 DM since  71 years of age.  Mr. Goh presents with continued improvement in his glycemic profile.  His meter average is 130-145 mg per DL in the last 30 days.  His point-of-care A1c is 6.6%, improving from 9.1%.  He did not have any hypoglycemia.  He has continued to feel better.    - I had a long discussion with him about the progressive nature of diabetes and the pathology behind its complications. -He does not report gross complications from diabetes, however, he remains at a high risk for more acute and chronic complications which include CAD, CVA, CKD, retinopathy, and neuropathy. These are all discussed in detail with him.  - I have counseled him on diet  and weight management  by adopting a carbohydrate restricted/protein rich diet. Patient is encouraged to switch to  unprocessed or minimally processed     complex starch and increased protein intake (animal or plant  source), fruits, and vegetables. -  he is advised to stick to a routine mealtimes to eat 3 meals  a day and avoid unnecessary snacks ( to snack only to correct hypoglycemia).   - he acknowledges that there is a room for improvement in his food and drink choices. - Suggestion is made for him to avoid simple carbohydrates  from his diet including Cakes, Sweet Desserts, Ice Cream, Soda (diet and regular), Sweet Tea, Candies, Chips, Cookies, Store Bought Juices, Alcohol , Artificial Sweeteners,  Coffee Creamer, and "Sugar-free" Products, Lemonade. This will help patient to have more stable blood glucose profile and potentially avoid unintended weight gain.  The following Lifestyle Medicine recommendations according to [place]  [facility]) were discussed and and offered to patient and he  agrees to start the journey:  A. Whole Foods, Plant-Based Nutrition comprising of fruits and vegetables, plant-based proteins, whole-grain carbohydrates was discussed in detail with the patient.   A list for source of those nutrients were also provided to the patient.  Patient will use only water or unsweetened tea for hydration. B.  The need to stay away from risky substances including alcohol, smoking; obtaining 7 to 9 hours of restorative sleep, at least 150 minutes of moderate intensity exercise weekly, the importance of healthy social connections,  and stress management techniques were discussed. C.  A full color page of  Calorie density of various food groups per pound showing examples of each food groups was provided to the patient.    - he will be scheduled with Jearld Fenton, RDN,  CDE for diabetes education.  - I have approached him with the following individualized plan to manage  his diabetes and patient agrees:   -He presents with significant improvement in his glycemic profile and A1c of 6.6% improving from 9.1%.  This is achieved along with significant weight loss of 25 pounds  since last year.     -He reports some side effects from the higher dose of Trulicity, he is advised to finish his current supplies of Trulicity 6.44 mg subcutaneously weekly and discontinue.    -He is advised to continue metformin 1000 mg p.o. twice daily.  May continue to monitor blood glucose at least once a day before breakfast.  He is advised to call clinic for blood glucose readings greater than 200 mg per DL at fasting.  - Specific targets for  A1c;  LDL, HDL,  and Triglycerides were discussed with the patient.  2) Blood Pressure /Hypertension: His blood pressure is controlled to target.  The recommended lifestyle nutrition is helping the blood pressure towards normal at 128/76.  He was able to come off of hydrochlorothiazide recently.  He is currently on lisinopril 40 mg once a day and amlodipine 10 mg p.o. once a day.  He has a chance to come off of amlodipine.   3) Lipids/Hyperlipidemia:   Review of his recent lipid panel showed  controlled  LDL at 48 .  he  is advised to continue atorvastatin 10 mg p.o. daily at bedtime. Side effects and precautions discussed with him.  4)  Weight/Diet:  Body mass index is 26.97 kg/m.  -      he is  a candidate for some more weight loss. I discussed with him the fact that loss of 5 - 10% of his  current body weight will have the most impact on his diabetes management.  Exercise, and detailed carbohydrates information provided  -  detailed on discharge instructions.  5) hypothyroidism-the circumstance of his diagnosis are not available to review. His TSH in October was higher than target at 4.9, however free T4 was consistent with appropriate replacement at 1.39. He is currently on Synthroid 125 mcg p.o. daily before breakfast.  - We discussed about the correct intake of his thyroid hormone, on empty stomach at fasting, with water, separated by at least 30 minutes from breakfast and other medications,  and separated by more than 4 hours from calcium, iron,  multivitamins, acid reflux medications (PPIs). -Patient is made aware of the fact that thyroid hormone replacement is needed for life, dose to be adjusted by periodic monitoring of thyroid function tests.   6) Chronic Care/Health Maintenance:  -he  is on ACEI/ARB and Statin medications and  is encouraged to initiate and continue to follow up with Ophthalmology, Dentist,  Podiatrist at least yearly or according to recommendations, and advised to   stay away from smoking. I have recommended yearly flu vaccine and pneumonia vaccine at least every 5 years; moderate intensity exercise for up to 150 minutes weekly; and  sleep for at least 7 hours a day.  - he is  advised to maintain close follow up with Lindell Spar, MD for primary care needs, as well as his other providers for optimal and coordinated care.   I spent 41 minutes in the care of the patient today including review of labs from [place], Lipids, Thyroid Function, Hematology (current and previous including abstractions from other facilities); face-to-face time discussing  his blood glucose readings/logs, discussing hypoglycemia and hyperglycemia episodes and  symptoms, medications doses, his options of short and long term treatment based on the latest standards of care / guidelines;  discussion about incorporating lifestyle medicine;  and documenting the encounter.    Please refer to Patient Instructions for Blood Glucose Monitoring and Insulin/Medications Dosing Guide"  in media tab for additional information. Please  also refer to " Patient Self Inventory" in the Media  tab for reviewed elements of pertinent patient history.  Mancel Parsons participated in the discussions, expressed understanding, and voiced agreement with the above plans.  All questions were answered to his satisfaction. he is encouraged to contact clinic should he have any questions or concerns prior to his return visit.   Follow up plan: - Return in about 4 months (around  11/25/2021) for F/U with Pre-visit Labs, Meter, Logs, A1c here.Glade Lloyd, MD [facility] [address] [place], [place] [address] Phone: [phone]  Fax: [phone]    07/28/2021, 12:53 PM  This note was partially dictated with voice recognition software. Similar sounding words can be transcribed inadequately or may not  be corrected upon review.

## 2021-07-28 NOTE — Patient Instructions (Signed)

## 2021-08-12 ENCOUNTER — Other Ambulatory Visit: Payer: Self-pay | Admitting: "Endocrinology

## 2021-08-12 ENCOUNTER — Other Ambulatory Visit: Payer: Self-pay

## 2021-08-12 DIAGNOSIS — E669 Obesity, unspecified: Secondary | ICD-10-CM

## 2021-08-12 DIAGNOSIS — E1169 Type 2 diabetes mellitus with other specified complication: Secondary | ICD-10-CM

## 2021-08-12 MED ORDER — GLUCOSE BLOOD VI STRP: 1.0000 | ORAL_STRIP | Freq: Two times a day (BID) | 2 refills | Status: DC

## 2021-08-15 ENCOUNTER — Other Ambulatory Visit: Payer: Self-pay | Admitting: Cardiology

## 2021-08-27 ENCOUNTER — Encounter: Payer: Self-pay | Admitting: "Endocrinology

## 2021-08-29 ENCOUNTER — Other Ambulatory Visit: Payer: Self-pay | Admitting: "Endocrinology

## 2021-08-29 ENCOUNTER — Other Ambulatory Visit (HOSPITAL_COMMUNITY): Payer: Self-pay

## 2021-08-29 MED ORDER — ONETOUCH ULTRA VI STRP
ORAL_STRIP | 2 refills | Status: AC
  Filled 2021-08-29: qty 100, 30d supply, fill #0

## 2021-08-29 MED ORDER — ONETOUCH ULTRASOFT LANCETS MISC
2 refills | Status: AC
  Filled 2021-08-29: qty 100, 50d supply, fill #0

## 2021-09-06 ENCOUNTER — Other Ambulatory Visit (HOSPITAL_COMMUNITY): Payer: Self-pay

## 2021-09-22 ENCOUNTER — Telehealth: Payer: Self-pay | Admitting: Nutrition

## 2021-09-22 ENCOUNTER — Other Ambulatory Visit: Payer: Self-pay | Admitting: "Endocrinology

## 2021-09-22 DIAGNOSIS — E119 Type 2 diabetes mellitus without complications: Secondary | ICD-10-CM

## 2021-09-22 DIAGNOSIS — E1169 Type 2 diabetes mellitus with other specified complication: Secondary | ICD-10-CM

## 2021-09-22 NOTE — Telephone Encounter (Signed)
VM left to come at 930 am Monday March 6th to meet with me to go over plant based lifestyle. ?

## 2021-09-26 ENCOUNTER — Encounter: Payer: Self-pay | Admitting: Nutrition

## 2021-09-26 ENCOUNTER — Other Ambulatory Visit: Payer: Self-pay

## 2021-09-26 ENCOUNTER — Encounter: Payer: HMO | Attending: "Endocrinology | Admitting: Nutrition

## 2021-09-26 VITALS — Ht 71.0 in | Wt 194.0 lb

## 2021-09-26 DIAGNOSIS — Z7985 Long-term (current) use of injectable non-insulin antidiabetic drugs: Secondary | ICD-10-CM | POA: Insufficient documentation

## 2021-09-26 DIAGNOSIS — Z713 Dietary counseling and surveillance: Secondary | ICD-10-CM | POA: Diagnosis not present

## 2021-09-26 DIAGNOSIS — Z7984 Long term (current) use of oral hypoglycemic drugs: Secondary | ICD-10-CM | POA: Insufficient documentation

## 2021-09-26 DIAGNOSIS — I2510 Atherosclerotic heart disease of native coronary artery without angina pectoris: Secondary | ICD-10-CM

## 2021-09-26 DIAGNOSIS — I10 Essential (primary) hypertension: Secondary | ICD-10-CM

## 2021-09-26 DIAGNOSIS — E1169 Type 2 diabetes mellitus with other specified complication: Secondary | ICD-10-CM

## 2021-09-26 DIAGNOSIS — E119 Type 2 diabetes mellitus without complications: Secondary | ICD-10-CM | POA: Insufficient documentation

## 2021-09-26 DIAGNOSIS — E782 Mixed hyperlipidemia: Secondary | ICD-10-CM

## 2021-09-26 DIAGNOSIS — E669 Obesity, unspecified: Secondary | ICD-10-CM

## 2021-09-26 NOTE — Patient Instructions (Signed)
Goals Established by Pt ?Eat three meals per day at times discussed ?Drink a gallon of water per day ?Exercise 150 minutes or more per week ?Choose fruits, vegetables and whole grains and beans. ?Keep up the [place]!! ?Get A1C to 5.7% or less. ?Maintain weight  ? ?The [employer] of Lifestyle Medicine (ACL M) recommends nutrition derived mostly from Whole Food, Plant Predominant Sources example an apple instead of applesauce or apple pie. Eat Plenty of vegetables, Mushrooms, fruits, Legumes, Whole Grains, Nuts, seeds in lieu of processed meats, processed snacks/pastries red meat, poultry, eggs.  Use only water or unsweetened tea for hydration.  The College also recommends the need to stay away from risky substances including alcohol, smoking; obtaining 7-9 hours of restorative sleep, at least 150 minutes of moderate intensity exercise weekly, importance of healthy social connections, and being mindful of stress and seek help when it is overwhelming. ?

## 2021-09-26 NOTE — Progress Notes (Signed)
Medical Nutrition Therapy  ?Appointment Start time:  0930   Appointment End time:  1000 ? ?Primary concerns today: Diabetes Type 2  ?Referral diagnosis: E11.9 ?Preferred learning style: No Preference  ?Learning readiness: Change in process  ? ? ?NUTRITION ASSESSMENT  ?Type 2 Diabetic that has been seeing Dr. Dorris Fetch, Endocrinology. Original A1C was 9.1% and in the last month, following a whole food plant based lifestyle, he has reduced his A1C to 6.6%. ?He has been focused on eating whole foods, more plant based foods; beans, greens, fruits and vegetables. Had cut out red meat and eats some fish and chicken at times. ?He is feeling fuller longer, has more energy, has been sleeping much better and feels great. ?Drinking a gallon of water now.  ?Has lost 25 lbs in the last few months. Cut out processed and junk foods. ? ?Current diet is very well balanced and has near normalized his blood sugars. ?FBS 130's. Testing in am.   ?Will finish up his Trulicity and then won't be on it anymore. ? ?Currently taking Metformin 1000 mg BID. Has stopped his Janumet. ?Anthropometrics  ?Wt Readings from Last 3 Encounters:  ?09/26/21 194 lb (88 kg)  ?07/28/21 193 lb 6.4 oz (87.7 kg)  ?05/23/21 198 lb 12.8 oz (90.2 kg)  ? ?Ht Readings from Last 3 Encounters:  ?09/26/21 '5\' 11"'$  (1.803 m)  ?07/28/21 '5\' 11"'$  (1.803 m)  ?05/23/21 '5\' 11"'$  (1.803 m)  ? ?Body mass index is 27.06 kg/m?. ?'@BMIFA'$ @ ?Facility age limit for growth percentiles is 20 years. ?Facility age limit for growth percentiles is 20 years. ? ? Lipid Panel  ?   ?Component Value Date/Time  ? CHOL 108 04/26/2021 0958  ? TRIG 120 04/26/2021 0958  ? HDL 38 (L) 04/26/2021 0958  ? CHOLHDL 2.4 09/22/2020 0923  ? CHOLHDL 3.0 09/23/2019 0746  ? VLDL 25 09/17/2017 0843  ? [place] 48 04/26/2021 0958  ? LDLCALC 47 09/23/2019 0746  ? LABVLDL 22 04/26/2021 0958  ? ?CMP Latest Ref Rng & Units 04/26/2021 04/09/2021 09/22/2020  ?Glucose 70 - 99 mg/dL 320(H) 302(H) 212(H)  ?BUN 8 - 27 mg/dL '15 20 16   '$ ?Creatinine 0.76 - 1.27 mg/dL 1.05 1.28(H) 1.02  ?Sodium 134 - 144 mmol/L 137 131(L) 139  ?Potassium 3.5 - 5.2 mmol/L 4.4 3.5 4.3  ?Chloride 96 - 106 mmol/L 95(L) 96(L) 98  ?CO2 20 - 29 mmol/L '21 24 26  '$ ?Calcium 8.6 - 10.2 mg/dL 9.4 8.4(L) 9.3  ?Total Protein 6.0 - 8.5 g/dL 6.7 7.6 -  ?Total Bilirubin 0.0 - 1.2 mg/dL 0.7 2.2(H) -  ?Alkaline Phos 44 - 121 IU/L 85 76 -  ?AST 0 - 40 IU/L 19 18 -  ?ALT 0 - 44 IU/L 15 15 -  ? ? ? ?Clinical ?Medical Hx: Hyperlipidemia, HTN ?Medications: see chart Metformin 1000 mg BID ?Labs:  ?Lab Results  ?Component Value Date  ? HGBA1C 6.6 07/28/2021  ? ?CMP Latest Ref Rng & Units 04/26/2021 04/09/2021 09/22/2020  ?Glucose 70 - 99 mg/dL 320(H) 302(H) 212(H)  ?BUN 8 - 27 mg/dL '15 20 16  '$ ?Creatinine 0.76 - 1.27 mg/dL 1.05 1.28(H) 1.02  ?Sodium 134 - 144 mmol/L 137 131(L) 139  ?Potassium 3.5 - 5.2 mmol/L 4.4 3.5 4.3  ?Chloride 96 - 106 mmol/L 95(L) 96(L) 98  ?CO2 20 - 29 mmol/L '21 24 26  '$ ?Calcium 8.6 - 10.2 mg/dL 9.4 8.4(L) 9.3  ?Total Protein 6.0 - 8.5 g/dL 6.7 7.6 -  ?Total Bilirubin 0.0 - 1.2 mg/dL 0.7  2.2(H) -  ?Alkaline Phos 44 - 121 IU/L 85 76 -  ?AST 0 - 40 IU/L 19 18 -  ?ALT 0 - 44 IU/L 15 15 -  ? ?Lipid Panel  ?   ?Component Value Date/Time  ? CHOL 108 04/26/2021 0958  ? TRIG 120 04/26/2021 0958  ? HDL 38 (L) 04/26/2021 0958  ? CHOLHDL 2.4 09/22/2020 0923  ? CHOLHDL 3.0 09/23/2019 0746  ? VLDL 25 09/17/2017 0843  ? [place] 48 04/26/2021 0958  ? LDLCALC 47 09/23/2019 0746  ? LABVLDL 22 04/26/2021 0958  ? ?Maurilio Lovely Signs/Symptoms: None ? ?Lifestyle & Dietary Hx ?Lives with his wife. Is a Theme park manager. Has lost 25 lbs in the last few months. He and his wife are changing over to more whole food plant based lifestyle. ? ?Estimated daily fluid intake: 100 oz ?Supplements: none ?Sleep: good ?Stress / self-care: no ?Current average weekly physical activity: Walks daily and has equipment in his basement. ? ?24-Hr Dietary Recall ?First Meal: Oatmeal, raisins and nuts ?Snack:  ?Second Meal: [place]  beans, greens, fruit, wate ?Snack:  ? ?Third Meal: Bean chili, green salad, fruit, water ?Snack: nuts ?Beverages: water ? ?Estimated Energy Needs ?Calories: 1800-2000 ?Carbohydrate: 200g ?Protein: 135g ?Fat: 50g ? ? ?NUTRITION DIAGNOSIS  ?NB-1.1 Food and nutrition-related knowledge deficit As related to Diabetes Type 2.  As evidenced by A1C 9.1%.. ? ? ?NUTRITION INTERVENTION  ?Nutrition education (E-1) on the following topics:  ?Nutrition and Diabetes education provided on My Plate, CHO counting, meal planning, portion sizes, timing of meals, avoiding snacks between meals unless having a low blood sugar, target ranges for A1C and blood sugars, signs/symptoms and treatment of hyper/hypoglycemia, monitoring blood sugars, taking medications as prescribed, benefits of exercising 30 minutes per day and prevention of complications of DM. ? ?Lifestyle Medicine ?- Whole Food, Plant Predominant Nutrition is highly recommended: Eat Plenty of vegetables, Mushrooms, fruits, Legumes, Whole Grains, Nuts, seeds in lieu of processed meats, processed snacks/pastries red meat, poultry, eggs.  ?  ?-It is better to avoid simple carbohydrates including: Cakes, Sweet Desserts, Ice Cream, Soda (diet and regular), Sweet Tea, Candies, Chips, Cookies, Store Bought Juices, Alcohol in Excess of  1-2 drinks a day, Lemonade,  Artificial Sweeteners, Doughnuts, Coffee Creamers, "Sugar-free" Products, etc, etc.  This is not a complete list..... ? ?Exercise: If you are able: 30 -60 minutes a day ,4 days a week, or 150 minutes a week.  The longer the better.  Combine stretch, strength, and aerobic activities.  If you were told in the past that you have high risk for cardiovascular diseases, you may seek evaluation by your heart doctor prior to initiating moderate to intense exercise programs. ?Handouts Provided Include  ?Lifestyle Medicine Whole food plant based lifestyle ?Meal Plan Card ?Lifestyle nutrition ? ?Learning Style & Readiness for  Change ?Teaching method utilized: Visual & Auditory  ?Demonstrated degree of understanding via: Teach Back  ?Barriers to learning/adherence to lifestyle change: none  ? ?Goals Established by Pt ?Eat three meals per day at times discussed ?Drink a gallon of water per day ?Exercise 150 minutes or more per week ?Choose fruits, vegetables and whole grains and beans. ?Keep up the [place]!! ?Get A1C to 5.7% or less. ?Maintain weight ?  ?The [employer] of Lifestyle Medicine (ACL M) recommends nutrition derived mostly from Whole Food, Plant Predominant Sources example an apple instead of applesauce or apple pie. Eat Plenty of vegetables, Mushrooms, fruits, Legumes, Whole Grains, Nuts, seeds in lieu of processed meats, processed  snacks/pastries red meat, poultry, eggs.  Use only water or unsweetened tea for hydration.  The College also recommends the need to stay away from risky substances including alcohol, smoking; obtaining 7-9 hours of restorative sleep, at least 150 minutes of moderate intensity exercise weekly, importance of healthy social connections, and being mindful of stress and seek help when it is overwhelming. ? ? ?MONITORING & EVALUATION ?Dietary intake, weekly physical activity, and blood sugar in 3 months. ? ?Next Steps  ?Patient is to continue to implement lifestyle medicine.. ? ? ? ? ? ? ? ? ? ? ? ? ? ? ? ? ? ? ? ? ? ? ? ? ? ? ? ? ? ? ? ? ? ? ?

## 2021-10-05 ENCOUNTER — Other Ambulatory Visit: Payer: Self-pay | Admitting: Family Medicine

## 2021-10-05 ENCOUNTER — Other Ambulatory Visit: Payer: Self-pay | Admitting: "Endocrinology

## 2021-10-17 ENCOUNTER — Other Ambulatory Visit: Payer: Self-pay | Admitting: Internal Medicine

## 2021-10-17 DIAGNOSIS — E039 Hypothyroidism, unspecified: Secondary | ICD-10-CM

## 2021-10-25 ENCOUNTER — Ambulatory Visit: Payer: HMO | Admitting: Nurse Practitioner

## 2021-11-01 ENCOUNTER — Encounter: Payer: Self-pay | Admitting: Internal Medicine

## 2021-11-01 ENCOUNTER — Ambulatory Visit (INDEPENDENT_AMBULATORY_CARE_PROVIDER_SITE_OTHER): Payer: HMO | Admitting: Internal Medicine

## 2021-11-01 VITALS — BP 128/78 | HR 75 | Resp 18 | Ht 71.5 in | Wt 197.0 lb

## 2021-11-01 DIAGNOSIS — E559 Vitamin D deficiency, unspecified: Secondary | ICD-10-CM

## 2021-11-01 DIAGNOSIS — I2510 Atherosclerotic heart disease of native coronary artery without angina pectoris: Secondary | ICD-10-CM

## 2021-11-01 DIAGNOSIS — E039 Hypothyroidism, unspecified: Secondary | ICD-10-CM | POA: Diagnosis not present

## 2021-11-01 DIAGNOSIS — E1169 Type 2 diabetes mellitus with other specified complication: Secondary | ICD-10-CM | POA: Diagnosis not present

## 2021-11-01 DIAGNOSIS — Z125 Encounter for screening for malignant neoplasm of prostate: Secondary | ICD-10-CM | POA: Diagnosis not present

## 2021-11-01 DIAGNOSIS — J309 Allergic rhinitis, unspecified: Secondary | ICD-10-CM | POA: Diagnosis not present

## 2021-11-01 DIAGNOSIS — E782 Mixed hyperlipidemia: Secondary | ICD-10-CM | POA: Diagnosis not present

## 2021-11-01 DIAGNOSIS — I10 Essential (primary) hypertension: Secondary | ICD-10-CM | POA: Diagnosis not present

## 2021-11-01 MED ORDER — AZELASTINE HCL 0.1 % NA SOLN: 1.0000 | Freq: Two times a day (BID) | NASAL | 12 refills | Status: AC

## 2021-11-01 MED ORDER — LEVOCETIRIZINE DIHYDROCHLORIDE 5 MG PO TABS: 5.0000 mg | ORAL_TABLET | Freq: Every evening | ORAL | 3 refills | Status: DC

## 2021-11-01 NOTE — Assessment & Plan Note (Signed)
Currently uncontrolled with Zyrtec ?Switch to Xyzal ?Added azelastine nasal spray ?

## 2021-11-01 NOTE — Assessment & Plan Note (Signed)
On statin Check lipid profile 

## 2021-11-01 NOTE — Assessment & Plan Note (Signed)
Ordered PSA after discussing its limitations for prostate cancer screening, including false positive results leading additional investigations. 

## 2021-11-01 NOTE — Patient Instructions (Addendum)
Please continue to take medications as prescribed. ? ?Please continue to follow low carb diet and ambulate as tolerated. ? ?Please consider getting Shingrix and TDaP vaccine at your local pharmacy. ? ?Please get fasting blood tests done before the next visit. ?

## 2021-11-01 NOTE — Assessment & Plan Note (Signed)
Lab Results  ?Component Value Date  ? TSH 4.980 (H) 04/26/2021  ? ?On Levothyrozine 125 mcg QD ?Check TSH and free T4 ?

## 2021-11-01 NOTE — Assessment & Plan Note (Signed)
Lab Results  ?Component Value Date  ? HGBA1C 6.6 07/28/2021  ? ?On Metformin ?Followed by endocrinology ?Advised to follow diabetic diet ?On ACE and statin ?F/u CMP and lipid panel ?Diabetic eye exam: Advised to follow up with Ophthalmology for diabetic eye exam ?

## 2021-11-01 NOTE — Progress Notes (Signed)
? ?Established Patient Office Visit ? ?Subjective:  ?Patient ID: Jim Salazar, male    DOB: Apr 14, 1951  Age: 71 y.o. MRN: [id number] ? ?CC:  ?Chief Complaint  ?Patient presents with  ? Follow-up  ?  6 month follow up has not had labs drawn pt has had sinus drainage starting 10/29/21 also has cough   ? ? ?HPI ?[name] is a 71 y.o. male who presents for f/u of his chronic medical conditions. ? ?HTN: BP is well-controlled. Takes medications regularly. Patient denies headache, dizziness, chest pain, dyspnea or palpitations. ?  ?DM: His HbA1C has improved to 6.6 from 9.1 now.  He has been trying to follow low-carb diet with the help of nutritionist.  Denies any polyuria or polydipsia. ?  ?Hypothyroidism: He has been taking Levothyroxine 125 mcg QD. Denies any constipation, diarrhea, weight change recently, hair or nail changes. ? ?He complains of nasal congestion, cough and postnasal drip, but denies any fever, chills, sore throat, dyspnea or wheezing currently.  He has been taking Zyrtec for allergies.  He also uses Flonase. ?  ? ?Past Medical History:  ?Diagnosis Date  ? CAD (coronary artery disease), native coronary artery   ? mild nonobstructive plaque in LCx and RCA and mildly obstructive minimally calcified plaque in mid LAD on coronary CTA 11/2017  ? Depression   ? Diabetes mellitus without complication ([place])   ? GERD (gastroesophageal reflux disease)   ? Hypertension   ? Hypothyroidism, unspecified   ? Left sided numbness 09/17/2017  ? Major depressive disorder, single episode, unspecified   ? Need for immunization against influenza 06/12/2019  ? Need for vaccination against Streptococcus pneumoniae using pneumococcal conjugate vaccine 13 06/12/2019  ? Obesity, unspecified   ? Right upper quadrant abdominal pain 09/22/2019  ? Screening for prostate cancer 06/12/2019  ? Syncope 10/17/2017  ? Thyroid disease   ? Tremor, essential 11/30/2017  ? Type 2 diabetes mellitus with hyperglycemia ([facility])   ? ? ?Past  Surgical History:  ?Procedure Laterality Date  ? APPENDECTOMY    ? COLONOSCOPY    ? TONSILLECTOMY    ? ? ?Family History  ?Problem Relation Age of Onset  ? Kidney disease Mother   ? Hypertension Mother   ? Thyroid disease Mother   ? Diabetes Mellitus II Mother   ? Hyperlipidemia Mother   ? Stroke Mother   ? Hypertension Father   ? Cancer Father   ?     lung  ? Hypertension Other   ? Colon cancer Neg Hx   ? Colon polyps Neg Hx   ? Esophageal cancer Neg Hx   ? Rectal cancer Neg Hx   ? Stomach cancer Neg Hx   ? ? ?Social History  ? ?Socioeconomic History  ? Marital status: Married  ?  Spouse name: [name]   ? Number of children: 2  ? Years of education: College  ? Highest education level: Not on file  ?Occupational History  ? Occupation: semi retired   ?  Comment: [employer]   ?Tobacco Use  ? Smoking status: Never  ? Smokeless tobacco: Never  ?Vaping Use  ? Vaping Use: Never used  ?Substance and Sexual Activity  ? Alcohol use: No  ? Drug use: No  ? Sexual activity: Yes  ?  Birth control/protection: None  ?Other Topics Concern  ? Not on file  ?Social History Narrative  ? Lives with 2nd wife Lynelle Smoke -married 10 years  ?   ? 3 dogs: 2 inside  and 1 outside: Skimp; Ginger; Eve  ?   ? Enjoys: model railroad Museum/gallery curator, Chiropodist some  ?   ? Diet: eats all food groups -raisins   ? Caffeine use: 1-2 cups decaf coffee daily, diet coke sometimes  ? Water: 4-5 bottles a day   ?   ? Wears seat belt  ? Does not use phone while driving-hands free   ? Smoke detectors at home  ? Weapons in lock box  ? ?Social Determinants of Health  ? ?Financial Resource Strain: Low Risk   ? Difficulty of Paying Living Expenses: Not hard at all  ?Food Insecurity: No Food Insecurity  ? Worried About Charity fundraiser in the Last Year: Never true  ? Ran Out of Food in the Last Year: Never true  ?Transportation Needs: No Transportation Needs  ? Lack of Transportation (Medical): No  ? Lack of Transportation (Non-Medical): No  ?Physical Activity: Sufficiently Active   ? Days of Exercise per Week: 6 days  ? Minutes of Exercise per Session: 40 min  ?Stress: No Stress Concern Present  ? Feeling of Stress : Not at all  ?Social Connections: Socially Integrated  ? Frequency of Communication with Friends and Family: More than three times a week  ? Frequency of Social Gatherings with Friends and Family: Three times a week  ? Attends Religious Services: More than 4 times per year  ? Active Member of Clubs or Organizations: Yes  ? Attends Archivist Meetings: More than 4 times per year  ? Marital Status: Married  ?Intimate Partner Violence: Not At Risk  ? Fear of Current or Ex-Partner: No  ? Emotionally Abused: No  ? Physically Abused: No  ? Sexually Abused: No  ? ? ?Outpatient Medications Prior to Visit  ?Medication Sig Dispense Refill  ? amLODipine (NORVASC) 10 MG tablet TAKE 1 TABLET BY MOUTH AT BEDTIME 90 tablet 1  ? aspirin EC 81 MG tablet Take 81 mg by mouth every evening.    ? Blood Glucose Monitoring Suppl (ONE TOUCH ULTRA 2) w/Device KIT USE TO TEST BLOOD GLUCOSE ONCE DAILY AS DIRECTED 1 kit 0  ? fluticasone (FLONASE) 50 MCG/ACT nasal spray Place 2 sprays into both nostrils daily. 16 g 0  ? glucose blood (ONETOUCH ULTRA) test strip Use as instructed 100 each 2  ? Lancets (ONETOUCH ULTRASOFT) lancets Test at least 2 times a day 100 each 2  ? levothyroxine (SYNTHROID) 125 MCG tablet TAKE (1) TABLET BY MOUTH ONCE DAILY. 90 tablet 0  ? lisinopril (ZESTRIL) 40 MG tablet TAKE (1) TABLET BY MOUTH AT BEDTIME. 90 tablet 0  ? metFORMIN (GLUCOPHAGE) 1000 MG tablet TAKE 1 TABLET BY MOUTH TWICE DAILY WITH A MEAL. 180 tablet 0  ? PARoxetine (PAXIL) 20 MG tablet TAKE 1 TABLET BY MOUTH DAILY. 90 tablet 0  ? cetirizine (ZYRTEC) 10 MG tablet Take 1 tablet (10 mg total) by mouth daily. 30 tablet 0  ? atorvastatin (LIPITOR) 10 MG tablet TAKE 1 TABLET BY MOUTH ONCE A DAY 90 tablet 3  ? ?No facility-administered medications prior to visit.  ? ? ?No Known Allergies ? ?ROS ?Review of Systems   ?Constitutional:  Negative for chills and fever.  ?HENT:  Positive for congestion, postnasal drip and sinus pressure. Negative for sore throat.   ?Eyes:  Negative for pain and discharge.  ?Respiratory:  Negative for cough and shortness of breath.   ?Cardiovascular:  Negative for chest pain and palpitations.  ?Gastrointestinal:  Negative for constipation,  diarrhea and vomiting.  ?Endocrine: Negative for polydipsia and polyuria.  ?Genitourinary:  Negative for dysuria and hematuria.  ?Musculoskeletal:  Negative for neck pain and neck stiffness.  ?Skin:  Negative for rash.  ?Neurological:  Negative for dizziness, weakness, numbness and headaches.  ?Psychiatric/Behavioral:  Negative for agitation and behavioral problems.   ? ?  ?Objective:  ?  ?Physical Exam ?Vitals reviewed.  ?Constitutional:   ?   General: He is not in acute distress. ?   Appearance: He is not diaphoretic.  ?HENT:  ?   Head: Normocephalic and atraumatic.  ?   Nose: Congestion present.  ?   Mouth/Throat:  ?   Mouth: Mucous membranes are moist.  ?Eyes:  ?   General: No scleral icterus. ?   Extraocular Movements: Extraocular movements intact.  ?Cardiovascular:  ?   Rate and Rhythm: Normal rate and regular rhythm.  ?   Pulses: Normal pulses.  ?   Heart sounds: Normal heart sounds. No murmur heard. ?Pulmonary:  ?   Breath sounds: Normal breath sounds. No wheezing or rales.  ?Musculoskeletal:  ?   Cervical back: Neck supple. No tenderness.  ?   Right lower leg: No edema.  ?   Left lower leg: No edema.  ?Skin: ?   General: Skin is warm.  ?   Findings: No rash.  ?Neurological:  ?   General: No focal deficit present.  ?   Mental Status: He is alert and oriented to person, place, and time.  ?   Sensory: No sensory deficit.  ?   Motor: No weakness.  ?Psychiatric:     ?   Mood and Affect: Mood normal.     ?   Behavior: Behavior normal.  ? ? ?BP 128/78 (BP Location: Left Arm, Patient Position: Sitting, Cuff Size: Normal)   Pulse 75   Resp 18   Ht 5' 11.5"  (1.816 m)   Wt 197 lb (89.4 kg)   SpO2 95%   BMI 27.09 kg/m?  ?Wt Readings from Last 3 Encounters:  ?11/01/21 197 lb (89.4 kg)  ?09/26/21 194 lb (88 kg)  ?07/28/21 193 lb 6.4 oz (87.7 kg)  ? ? ?Lab Results  ?[place]

## 2021-11-01 NOTE — Assessment & Plan Note (Signed)
Continue Aspirin 81 mg QD and statin Follows up with Cardiology 

## 2021-11-01 NOTE — Assessment & Plan Note (Signed)
BP Readings from Last 1 Encounters:  ?11/01/21 128/78  ? ?Well-controlled with Lisinopril and Amlodipine ?Counseled for compliance with the medications ?Advised DASH diet and moderate exercise/walking, at least 150 mins/week ? ?

## 2021-11-12 ENCOUNTER — Other Ambulatory Visit: Payer: Self-pay | Admitting: Cardiology

## 2021-11-12 ENCOUNTER — Other Ambulatory Visit: Payer: Self-pay | Admitting: "Endocrinology

## 2021-11-12 DIAGNOSIS — E1169 Type 2 diabetes mellitus with other specified complication: Secondary | ICD-10-CM

## 2021-11-14 ENCOUNTER — Encounter: Payer: Self-pay | Admitting: Internal Medicine

## 2021-11-14 ENCOUNTER — Other Ambulatory Visit: Payer: Self-pay | Admitting: Internal Medicine

## 2021-11-14 DIAGNOSIS — R053 Chronic cough: Secondary | ICD-10-CM

## 2021-11-14 MED ORDER — BENZONATATE 100 MG PO CAPS: 100.0000 mg | ORAL_CAPSULE | Freq: Two times a day (BID) | ORAL | 1 refills | Status: DC | PRN

## 2021-11-25 ENCOUNTER — Ambulatory Visit: Payer: HMO | Admitting: "Endocrinology

## 2021-12-21 ENCOUNTER — Other Ambulatory Visit: Payer: Self-pay | Admitting: Internal Medicine

## 2021-12-21 ENCOUNTER — Other Ambulatory Visit: Payer: Self-pay | Admitting: Physician Assistant

## 2021-12-21 DIAGNOSIS — I10 Essential (primary) hypertension: Secondary | ICD-10-CM

## 2021-12-22 DIAGNOSIS — E119 Type 2 diabetes mellitus without complications: Secondary | ICD-10-CM | POA: Diagnosis not present

## 2021-12-22 DIAGNOSIS — E039 Hypothyroidism, unspecified: Secondary | ICD-10-CM | POA: Diagnosis not present

## 2021-12-23 LAB — VITAMIN D 25 HYDROXY (VIT D DEFICIENCY, FRACTURES): Vit D, 25-Hydroxy: 28.7 ng/mL — ABNORMAL LOW (ref 30.0–100.0)

## 2021-12-23 LAB — COMPREHENSIVE METABOLIC PANEL
ALT: 10 IU/L (ref 0–44)
AST: 14 IU/L (ref 0–40)
Albumin/Globulin Ratio: 2.4 — ABNORMAL HIGH (ref 1.2–2.2)
Albumin: 4.5 g/dL (ref 3.8–4.8)
Alkaline Phosphatase: 76 IU/L (ref 44–121)
BUN/Creatinine Ratio: 14 (ref 10–24)
BUN: 17 mg/dL (ref 8–27)
Bilirubin Total: 1.3 mg/dL — ABNORMAL HIGH (ref 0.0–1.2)
CO2: 26 mmol/L (ref 20–29)
Calcium: 9.6 mg/dL (ref 8.6–10.2)
Chloride: 98 mmol/L (ref 96–106)
Creatinine, Ser: 1.22 mg/dL (ref 0.76–1.27)
Globulin, Total: 1.9 g/dL (ref 1.5–4.5)
Glucose: 188 mg/dL — ABNORMAL HIGH (ref 70–99)
Potassium: 4.2 mmol/L (ref 3.5–5.2)
Sodium: 140 mmol/L (ref 134–144)
Total Protein: 6.4 g/dL (ref 6.0–8.5)
eGFR: 64 mL/min/{1.73_m2} (ref >59)

## 2021-12-23 LAB — T4, FREE: Free T4: 1.74 ng/dL (ref 0.82–1.77)

## 2021-12-23 LAB — TSH: TSH: 0.65 u[IU]/mL (ref 0.450–4.500)

## 2021-12-27 ENCOUNTER — Ambulatory Visit: Payer: HMO | Admitting: "Endocrinology

## 2021-12-27 ENCOUNTER — Encounter: Payer: Self-pay | Admitting: "Endocrinology

## 2021-12-27 VITALS — BP 138/70 | HR 51 | Ht 71.5 in | Wt 201.2 lb

## 2021-12-27 DIAGNOSIS — E782 Mixed hyperlipidemia: Secondary | ICD-10-CM | POA: Diagnosis not present

## 2021-12-27 DIAGNOSIS — E119 Type 2 diabetes mellitus without complications: Secondary | ICD-10-CM

## 2021-12-27 DIAGNOSIS — E039 Hypothyroidism, unspecified: Secondary | ICD-10-CM

## 2021-12-27 DIAGNOSIS — I10 Essential (primary) hypertension: Secondary | ICD-10-CM | POA: Diagnosis not present

## 2021-12-27 LAB — POCT GLYCOSYLATED HEMOGLOBIN (HGB A1C): HbA1c, POC (controlled diabetic range): 7.9 % — AB (ref 0.0–7.0)

## 2021-12-27 NOTE — Progress Notes (Signed)
12/27/2021, 5:54 PM  Endocrinology follow-up note   Subjective:    Patient ID: Jim Salazar, male    DOB: 1950/10/31.  Jim Salazar is being seen in follow-up after he was seen in consultation for management of currently uncontrolled symptomatic diabetes requested by  Lindell Spar, MD.   Past Medical History:  Diagnosis Date  . CAD (coronary artery disease), native coronary artery    mild nonobstructive plaque in LCx and RCA and mildly obstructive minimally calcified plaque in mid LAD on coronary CTA 11/2017  . Depression   . Diabetes mellitus without complication ([place])   . GERD (gastroesophageal reflux disease)   . Hypertension   . Hypothyroidism, unspecified   . Left sided numbness 09/17/2017  . Major depressive disorder, single episode, unspecified   . Need for immunization against influenza 06/12/2019  . Need for vaccination against Streptococcus pneumoniae using pneumococcal conjugate vaccine 13 06/12/2019  . Obesity, unspecified   . Right upper quadrant abdominal pain 09/22/2019  . Screening for prostate cancer 06/12/2019  . Syncope 10/17/2017  . Thyroid disease   . Tremor, essential 11/30/2017  . Type 2 diabetes mellitus with hyperglycemia [facility])     Past Surgical History:  Procedure Laterality Date  . APPENDECTOMY    . COLONOSCOPY    . TONSILLECTOMY      Social History   Socioeconomic History  . Marital status: Married    Spouse name: [name]   . Number of children: 2  . Years of education: College  . Highest education level: Not on file  Occupational History  . Occupation: semi retired     Comment: Theme park manager   Tobacco Use  . Smoking status: Never  . Smokeless tobacco: Never  Vaping Use  . Vaping Use: Never used  Substance and Sexual Activity  . Alcohol use: No  . Drug use: No  . Sexual activity: Yes    Birth control/protection: None  Other Topics Concern  . Not on file  Social History Narrative   Lives with 2nd wife  [name] -married 10 years      3 dogs: 2 inside and 1 outside: Skimp; Ginger; Eve      Enjoys: model railroad Museum/gallery curator, Chiropodist some      Diet: eats all food groups -raisins    Caffeine use: 1-2 cups decaf coffee daily, diet coke sometimes   Water: 4-5 bottles a day       Wears seat belt   Does not use phone while driving-hands free    Oceanographer at home   Weapons in lock box   Social Determinants of Health   Financial Resource Strain: Low Risk   . Difficulty of Paying Living Expenses: Not hard at all  Food Insecurity: No Food Insecurity  . Worried About Charity fundraiser in the Last Year: Never true  . Ran Out of Food in the Last Year: Never true  Transportation Needs: No Transportation Needs  . Lack of Transportation (Medical): No  . Lack of Transportation (Non-Medical): No  Physical Activity: Sufficiently Active  . Days of Exercise per Week: 6 days  . Minutes of Exercise per Session: 40 min  Stress: No Stress Concern Present  . Feeling of Stress : Not at all  Social Connections: Socially Integrated  . Frequency of Communication with Friends and Family: More than three times a week  . Frequency of Social Gatherings with Friends and Family: Three times a week  . Attends  Religious Services: More than 4 times per year  . Active Member of Clubs or Organizations: Yes  . Attends Archivist Meetings: More than 4 times per year  . Marital Status: Married    Family History  Problem Relation Age of Onset  . Kidney disease Mother   . Hypertension Mother   . Thyroid disease Mother   . Diabetes Mellitus II Mother   . Hyperlipidemia Mother   . Stroke Mother   . Hypertension Father   . Cancer Father        lung  . Hypertension Other   . Colon cancer Neg Hx   . Colon polyps Neg Hx   . Esophageal cancer Neg Hx   . Rectal cancer Neg Hx   . Stomach cancer Neg Hx     Outpatient Encounter Medications as of 12/27/2021  Medication Sig  . Cholecalciferol (VITAMIN D)  50 MCG (2000 UT) CAPS Take 2,000 Units by mouth daily with lunch.  Marland Kitchen amLODipine (NORVASC) 10 MG tablet TAKE 1 TABLET BY MOUTH AT BEDTIME  . aspirin EC 81 MG tablet Take 81 mg by mouth every evening.  Marland Kitchen atorvastatin (LIPITOR) 10 MG tablet TAKE (1) TABLET BY MOUTH ONCE DAILY.  Marland Kitchen azelastine (ASTELIN) 0.1 % nasal spray Place 1 spray into both nostrils 2 (two) times daily. Use in each nostril as directed  . Blood Glucose Monitoring Suppl (ONE TOUCH ULTRA 2) w/Device KIT USE TO TEST BLOOD GLUCOSE ONCE DAILY AS DIRECTED  . fluticasone (FLONASE) 50 MCG/ACT nasal spray Place 2 sprays into both nostrils daily.  Marland Kitchen glucose blood (ONETOUCH ULTRA) test strip Use as instructed  . Lancets (ONETOUCH ULTRASOFT) lancets Test at least 2 times a day  . levocetirizine (XYZAL) 5 MG tablet Take 1 tablet (5 mg total) by mouth every evening.  Marland Kitchen levothyroxine (SYNTHROID) 125 MCG tablet TAKE (1) TABLET BY MOUTH ONCE DAILY.  Marland Kitchen lisinopril (ZESTRIL) 40 MG tablet Take 1 tablet (40 mg total) by mouth daily. Pt. Needs to make appt. With Cardiologist in order to receive future refills. Thank You. 1st Attempt.  . metFORMIN (GLUCOPHAGE) 1000 MG tablet TAKE 1 TABLET BY MOUTH TWICE DAILY WITH A MEAL.  Marland Kitchen PARoxetine (PAXIL) 20 MG tablet TAKE 1 TABLET BY MOUTH DAILY.  . [DISCONTINUED] benzonatate (TESSALON) 100 MG capsule Take 1 capsule (100 mg total) by mouth 2 (two) times daily as needed for cough.  . [DISCONTINUED] JANUMET 50-1000 MG tablet Take 1 tablet by mouth 2 (two) times daily.   No facility-administered encounter medications on file as of 12/27/2021.    ALLERGIES: No Known Allergies  VACCINATION STATUS: Immunization History  Administered Date(s) Administered  . Fluad Quad(high Dose 65+) 04/29/2020, 04/26/2021  . Influenza Inj Mdck Quad Pf 05/23/2016  . Influenza Inj Mdck Quad With Preservative 05/24/2017  . Influenza, High Dose Seasonal PF 05/25/2018  . Influenza,inj,Quad PF,6+ Mos 06/12/2019  . Influenza-Unspecified  08/04/2012, 04/22/2013, 05/24/2017  . Janssen (J&J) SARS-COV-2 Vaccination 10/17/2019  . Pneumococcal Conjugate-13 06/12/2019  . Pneumococcal Polysaccharide-23 07/20/2017    Diabetes He presents for his follow-up diabetic visit. He has type 2 diabetes mellitus. Onset time: He was diagnosed at approximate age of 54 years. His disease course has been improving. There are no hypoglycemic associated symptoms. Pertinent negatives for hypoglycemia include no confusion, headaches, pallor or seizures. Pertinent negatives for diabetes include no chest pain, no fatigue, no polydipsia, no polyphagia, no polyuria and no weakness. There are no hypoglycemic complications. Symptoms are improving. There are no diabetic  complications. Risk factors for coronary artery disease include diabetes mellitus, dyslipidemia, family history, hypertension and sedentary lifestyle. Current diabetic treatments: Is currently on Trulicity 1.5 mg subcutaneously weekly, metformin 1000 mg p.o. twice daily. His weight is decreasing steadily (He has achieved 25 pounds of weight loss since last year.). He is following a generally unhealthy diet. When asked about meal planning, he reported none. He has not had a previous visit with a dietitian. He participates in exercise intermittently. His home blood glucose trend is decreasing steadily. His breakfast blood glucose range is generally 130-140 mg/dl. His overall blood glucose range is 130-140 mg/dl. (Mr. Tomasik presents with continued improvement in his glycemic profile.  His meter average is 130-145 mg per DL in the last 30 days.  His point-of-care A1c is 6.6%, improving from 9.1%.  He did not have any hypoglycemia.  He has continued to feel better.   ) An ACE inhibitor/angiotensin II receptor blocker is being taken. Eye exam is current.  Hyperlipidemia This is a chronic problem. The current episode started more than 1 year ago. Exacerbating diseases include diabetes. Pertinent negatives include  no chest pain, myalgias or shortness of breath. Current antihyperlipidemic treatment includes statins. Risk factors for coronary artery disease include dyslipidemia, diabetes mellitus, male sex and family history.  Hypertension This is a chronic problem. The current episode started more than 1 year ago. Pertinent negatives include no chest pain, headaches, neck pain, palpitations or shortness of breath. Risk factors for coronary artery disease include dyslipidemia, diabetes mellitus, family history, male gender and sedentary lifestyle. Past treatments include ACE inhibitors.    Review of Systems  Constitutional:  Negative for chills, fatigue, fever and unexpected weight change.  HENT:  Negative for dental problem, mouth sores and trouble swallowing.   Eyes:  Negative for visual disturbance.  Respiratory:  Negative for cough, choking, chest tightness, shortness of breath and wheezing.   Cardiovascular:  Negative for chest pain, palpitations and leg swelling.  Gastrointestinal:  Negative for abdominal distention, abdominal pain, constipation, diarrhea, nausea and vomiting.  Endocrine: Negative for polydipsia, polyphagia and polyuria.  Genitourinary:  Negative for dysuria, flank pain, hematuria and urgency.  Musculoskeletal:  Negative for back pain, gait problem, myalgias and neck pain.  Skin:  Negative for pallor, rash and wound.  Neurological:  Negative for seizures, syncope, weakness, numbness and headaches.  Psychiatric/Behavioral:  Negative for confusion and dysphoric mood.    Objective:       12/27/2021   10:43 AM 11/01/2021    8:32 AM 09/26/2021    9:01 AM  Vitals with BMI  Height 5' 11.5" 5' 11.5" 5' 11"   Weight 201 lbs 3 oz 197 lbs 194 lbs  BMI 27.67 58.8 50.27  Systolic 741 287   Diastolic 70 78   Pulse 51 75     BP 138/70   Pulse (!) 51   Ht 5' 11.5" (1.816 m)   Wt 201 lb 3.2 oz (91.3 kg)   BMI 27.67 kg/m   Wt Readings from Last 3 Encounters:  12/27/21 201 lb 3.2 oz (91.3  kg)  11/01/21 197 lb (89.4 kg)  09/26/21 194 lb (88 kg)       CMP ( most recent) CMP     Component Value Date/Time   NA 140 12/22/2021 0846   K 4.2 12/22/2021 0846   CL 98 12/22/2021 0846   CO2 26 12/22/2021 0846   GLUCOSE 188 (H) 12/22/2021 0846   GLUCOSE 302 (H) 04/09/2021 2059   BUN 17  12/22/2021 0846   CREATININE 1.22 12/22/2021 0846   CREATININE 1.02 06/22/2020 0847   CALCIUM 9.6 12/22/2021 0846   PROT 6.4 12/22/2021 0846   ALBUMIN 4.5 12/22/2021 0846   AST 14 12/22/2021 0846   ALT 10 12/22/2021 0846   ALKPHOS 76 12/22/2021 0846   BILITOT 1.3 (H) 12/22/2021 0846   GFRNONAA >60 04/09/2021 2059   GFRNONAA 76 06/22/2020 0847   GFRAA 88 06/22/2020 0847     Diabetic Labs (most recent): Lab Results  Component Value Date   HGBA1C 7.9 (A) 12/27/2021   HGBA1C 6.6 07/28/2021   HGBA1C 9.1 (H) 04/26/2021     Lipid Panel ( most recent) Lipid Panel     Component Value Date/Time   CHOL 108 04/26/2021 0958   TRIG 120 04/26/2021 0958   HDL 38 (L) 04/26/2021 0958   CHOLHDL 2.4 09/22/2020 0923   CHOLHDL 3.0 09/23/2019 0746   VLDL 25 09/17/2017 0843   LDLCALC 48 04/26/2021 0958   LDLCALC 47 09/23/2019 0746   LABVLDL 22 04/26/2021 0958      Lab Results  Component Value Date   TSH 0.650 12/22/2021   TSH 4.980 (H) 04/26/2021   TSH 0.322 (L) 04/09/2021   TSH 1.240 09/22/2020   TSH 7.26 (H) 09/23/2019   TSH 2.643 09/17/2017   FREET4 1.74 12/22/2021   FREET4 1.39 04/26/2021   FREET4 1.40 09/22/2020      Assessment & Plan:   1. Poorly controlled type 2 diabetes mellitus (HCC)    - EMON LANCE has currently uncontrolled symptomatic type 2 DM since  71 years of age.  Mr. Gaspard presents with continued improvement in his glycemic profile.  His meter average is 130-145 mg per DL in the last 30 days.  His point-of-care A1c is 6.6%, improving from 9.1%.  He did not have any hypoglycemia.  He has continued to feel better.    - I had a long discussion with him  about the progressive nature of diabetes and the pathology behind its complications. -He does not report gross complications from diabetes, however, he remains at a high risk for more acute and chronic complications which include CAD, CVA, CKD, retinopathy, and neuropathy. These are all discussed in detail with him.  - I have counseled him on diet  and weight management  by adopting a carbohydrate restricted/protein rich diet. Patient is encouraged to switch to  unprocessed or minimally processed     complex starch and increased protein intake (animal or plant source), fruits, and vegetables. -  he is advised to stick to a routine mealtimes to eat 3 meals  a day and avoid unnecessary snacks ( to snack only to correct hypoglycemia).   - he acknowledges that there is a room for improvement in his food and drink choices. - Suggestion is made for him to avoid simple carbohydrates  from his diet including Cakes, Sweet Desserts, Ice Cream, Soda (diet and regular), Sweet Tea, Candies, Chips, Cookies, Store Bought Juices, Alcohol , Artificial Sweeteners,  Coffee Creamer, and "Sugar-free" Products, Lemonade. This will help patient to have more stable blood glucose profile and potentially avoid unintended weight gain.  The following Lifestyle Medicine recommendations according to [place]  [facility]) were discussed and and offered to patient and he  agrees to start the journey:  A. Whole Foods, Plant-Based Nutrition comprising of fruits and vegetables, plant-based proteins, whole-grain carbohydrates was discussed in detail with the patient.   A list for source of  those nutrients were also provided to the patient.  Patient will use only water or unsweetened tea for hydration. B.  The need to stay away from risky substances including alcohol, smoking; obtaining 7 to 9 hours of restorative sleep, at least 150 minutes of moderate intensity exercise weekly, the importance of healthy social  connections,  and stress management techniques were discussed. C.  A full color page of  Calorie density of various food groups per pound showing examples of each food groups was provided to the patient.    - he will be scheduled with Jearld Fenton, RDN, CDE for diabetes education.  - I have approached him with the following individualized plan to manage  his diabetes and patient agrees:   -He presents with significant improvement in his glycemic profile and A1c of 6.6% improving from 9.1%.  This is achieved along with significant weight loss of 25 pounds since last year.     -He reports some side effects from the higher dose of Trulicity, he is advised to finish his current supplies of Trulicity 2.91 mg subcutaneously weekly and discontinue.    -He is advised to continue metformin 1000 mg p.o. twice daily.  May continue to monitor blood glucose at least once a day before breakfast.  He is advised to call clinic for blood glucose readings greater than 200 mg per DL at fasting.  - Specific targets for  A1c;  LDL, HDL,  and Triglycerides were discussed with the patient.  2) Blood Pressure /Hypertension: His blood pressure is controlled to target.  The recommended lifestyle nutrition is helping the blood pressure towards normal at 128/76.  He was able to come off of hydrochlorothiazide recently.  He is currently on lisinopril 40 mg once a day and amlodipine 10 mg p.o. once a day.  He has a chance to come off of amlodipine.   3) Lipids/Hyperlipidemia:   Review of his recent lipid panel showed  controlled  LDL at 48 .  he  is advised to continue atorvastatin 10 mg p.o. daily at bedtime. Side effects and precautions discussed with him.  4)  Weight/Diet:  Body mass index is 27.67 kg/m.  -      he is  a candidate for some more weight loss. I discussed with him the fact that loss of 5 - 10% of his  current body weight will have the most impact on his diabetes management.  Exercise, and detailed  carbohydrates information provided  -  detailed on discharge instructions.  5) hypothyroidism-the circumstance of his diagnosis are not available to review. His TSH in October was higher than target at 4.9, however free T4 was consistent with appropriate replacement at 1.39. He is currently on Synthroid 125 mcg p.o. daily before breakfast.  - We discussed about the correct intake of his thyroid hormone, on empty stomach at fasting, with water, separated by at least 30 minutes from breakfast and other medications,  and separated by more than 4 hours from calcium, iron, multivitamins, acid reflux medications (PPIs). -Patient is made aware of the fact that thyroid hormone replacement is needed for life, dose to be adjusted by periodic monitoring of thyroid function tests.   6) Chronic Care/Health Maintenance:  -he  is on ACEI/ARB and Statin medications and  is encouraged to initiate and continue to follow up with Ophthalmology, Dentist,  Podiatrist at least yearly or according to recommendations, and advised to   stay away from smoking. I have recommended yearly flu vaccine and pneumonia  vaccine at least every 5 years; moderate intensity exercise for up to 150 minutes weekly; and  sleep for at least 7 hours a day.  - he is  advised to maintain close follow up with Lindell Spar, MD for primary care needs, as well as his other providers for optimal and coordinated care.   I spent 41 minutes in the care of the patient today including review of labs from [place], Lipids, Thyroid Function, Hematology (current and previous including abstractions from other facilities); face-to-face time discussing  his blood glucose readings/logs, discussing hypoglycemia and hyperglycemia episodes and symptoms, medications doses, his options of short and long term treatment based on the latest standards of care / guidelines;  discussion about incorporating lifestyle medicine;  and documenting the encounter.    Please refer  to Patient Instructions for Blood Glucose Monitoring and Insulin/Medications Dosing Guide"  in media tab for additional information. Please  also refer to " Patient Self Inventory" in the Media  tab for reviewed elements of pertinent patient history.  Mancel Parsons participated in the discussions, expressed understanding, and voiced agreement with the above plans.  All questions were answered to his satisfaction. he is encouraged to contact clinic should he have any questions or concerns prior to his return visit.   Follow up plan: - Return in about 3 months (around 03/29/2022) for A1c -NV, Urine MA - NV.  Glade Lloyd, MD [facility] [address] [place], [place] [address] Phone: [phone]  Fax: [phone]    12/27/2021, 5:54 PM  This note was partially dictated with voice recognition software. Similar sounding words can be transcribed inadequately or may not  be corrected upon review.

## 2021-12-27 NOTE — Patient Instructions (Signed)

## 2021-12-28 ENCOUNTER — Other Ambulatory Visit: Payer: Self-pay | Admitting: Cardiology

## 2022-01-16 ENCOUNTER — Ambulatory Visit (INDEPENDENT_AMBULATORY_CARE_PROVIDER_SITE_OTHER): Payer: HMO

## 2022-01-16 DIAGNOSIS — Z0000 Encounter for general adult medical examination without abnormal findings: Secondary | ICD-10-CM

## 2022-02-03 ENCOUNTER — Other Ambulatory Visit: Payer: Self-pay | Admitting: Cardiology

## 2022-02-21 ENCOUNTER — Other Ambulatory Visit: Payer: Self-pay | Admitting: "Endocrinology

## 2022-02-21 DIAGNOSIS — E1169 Type 2 diabetes mellitus with other specified complication: Secondary | ICD-10-CM

## 2022-03-16 ENCOUNTER — Other Ambulatory Visit: Payer: Self-pay | Admitting: Cardiology

## 2022-04-04 ENCOUNTER — Ambulatory Visit: Payer: HMO | Admitting: "Endocrinology

## 2022-04-04 ENCOUNTER — Encounter: Payer: Self-pay | Admitting: "Endocrinology

## 2022-04-04 VITALS — BP 130/76 | HR 76 | Ht 71.5 in | Wt 197.0 lb

## 2022-04-04 DIAGNOSIS — E039 Hypothyroidism, unspecified: Secondary | ICD-10-CM

## 2022-04-04 DIAGNOSIS — E782 Mixed hyperlipidemia: Secondary | ICD-10-CM

## 2022-04-04 DIAGNOSIS — E119 Type 2 diabetes mellitus without complications: Secondary | ICD-10-CM | POA: Diagnosis not present

## 2022-04-04 DIAGNOSIS — I10 Essential (primary) hypertension: Secondary | ICD-10-CM | POA: Diagnosis not present

## 2022-04-04 LAB — POCT UA - MICROALBUMIN
Albumin/Creatinine Ratio, Urine, POC: <30
Creatinine, POC: 200 mg/dL
Microalbumin Ur, POC: 10 mg/L

## 2022-04-04 LAB — POCT GLYCOSYLATED HEMOGLOBIN (HGB A1C): HbA1c, POC (controlled diabetic range): 8 % — AB (ref 0.0–7.0)

## 2022-04-04 MED ORDER — TRULICITY 1.5 MG/0.5ML ~~LOC~~ SOAJ: 1.5000 mg | SUBCUTANEOUS | 2 refills | Status: DC

## 2022-04-04 NOTE — Patient Instructions (Signed)

## 2022-04-04 NOTE — Progress Notes (Signed)
04/04/2022, 3:11 PM  Endocrinology follow-up note   Subjective:    Patient ID: Jim Salazar, male    DOB: 1950/07/30.  Jim Salazar is being seen in follow-up after he was seen in consultation for  management of currently uncontrolled symptomatic diabetes requested by  Lindell Spar, MD.   Past Medical History:  Diagnosis Date   CAD (coronary artery disease), native coronary artery    mild nonobstructive plaque in LCx and RCA and mildly obstructive minimally calcified plaque in mid LAD on coronary CTA 11/2017   Depression    Diabetes mellitus without complication (HCC)    GERD (gastroesophageal reflux disease)    Hypertension    Hypothyroidism, unspecified    Left sided numbness 09/17/2017   Major depressive disorder, single episode, unspecified    Need for immunization against influenza 06/12/2019   Need for vaccination against Streptococcus pneumoniae using pneumococcal conjugate vaccine 13 06/12/2019   Obesity, unspecified    Right upper quadrant abdominal pain 09/22/2019   Screening for prostate cancer 06/12/2019   Syncope 10/17/2017   Thyroid disease    Tremor, essential 11/30/2017   Type 2 diabetes mellitus with hyperglycemia (HCC)     Past Surgical History:  Procedure Laterality Date   APPENDECTOMY     COLONOSCOPY     TONSILLECTOMY      Social History   Socioeconomic History   Marital status: Married    Spouse name: [name]    Number of children: 2   Years of education: College   Highest education level: Not on file  Occupational History   Occupation: semi retired     Comment: Theme park manager   Tobacco Use   Smoking status: Never   Smokeless tobacco: Never  Vaping Use   Vaping Use: Never used  Substance and Sexual Activity   Alcohol use: No   Drug use: No   Sexual activity: Yes    Birth control/protection: None  Other Topics Concern   Not on file  Social History Narrative   Lives with 2nd wife [name] -married 10 years      3  dogs: 2 inside and 1 outside: Skimp; Ginger; Eve      Enjoys: model railroad Museum/gallery curator, Chiropodist some      Diet: eats all food groups -raisins    Caffeine use: 1-2 cups decaf coffee daily, diet coke sometimes   Water: 4-5 bottles a day       Wears seat belt   Does not use phone while driving-hands free    Oceanographer at home   Weapons in lock box   Social Determinants of Health   Financial Resource Strain: Low Risk  (01/16/2022)   Overall Financial Resource Strain (CARDIA)    Difficulty of Paying Living Expenses: Not hard at all  Food Insecurity: No Food Insecurity (01/10/2021)   Hunger Vital Sign    Worried About Running Out of Food in the Last Year: Never true    [place] in the Last Year: Never true  Transportation Needs: No Transportation Needs (01/16/2022)   PRAPARE - Hydrologist (Medical): No    Lack of Transportation (Non-Medical): No  Physical Activity: Sufficiently Active (01/16/2022)   Exercise Vital Sign    Days of Exercise per Week: 5 days    Minutes of Exercise per Session: 40 min  Stress: No Stress Concern Present (01/10/2021)   [place]  Feeling of Stress : Not at all  Social Connections: Socially Integrated (01/16/2022)   Social Connection and Isolation Panel [NHANES]    Frequency of Communication with Friends and Family: More than three times a week    Frequency of Social Gatherings with Friends and Family: More than three times a week    Attends Religious Services: More than 4 times per year    Active Member of [employer] or Organizations: Yes    Attends Music therapist: More than 4 times per year    Marital Status: Married    Family History  Problem Relation Age of Onset   Kidney disease Mother    Hypertension Mother    Thyroid disease Mother    Diabetes Mellitus II Mother    Hyperlipidemia Mother    Stroke Mother    Hypertension Father     Cancer Father        lung   Hypertension Other    Colon cancer Neg Hx    Colon polyps Neg Hx    Esophageal cancer Neg Hx    Rectal cancer Neg Hx    Stomach cancer Neg Hx     Outpatient Encounter Medications as of 04/04/2022  Medication Sig   Dulaglutide (TRULICITY) 1.5 TU/8.8KC SOPN Inject 1.5 mg into the skin once a week.   amLODipine (NORVASC) 10 MG tablet TAKE 1 TABLET BY MOUTH AT BEDTIME   aspirin EC 81 MG tablet Take 81 mg by mouth every evening.   atorvastatin (LIPITOR) 10 MG tablet TAKE (1) TABLET BY MOUTH ONCE DAILY.   azelastine (ASTELIN) 0.1 % nasal spray Place 1 spray into both nostrils 2 (two) times daily. Use in each nostril as directed   Blood Glucose Monitoring Suppl (ONE TOUCH ULTRA 2) w/Device KIT USE TO TEST BLOOD GLUCOSE ONCE DAILY AS DIRECTED   Cholecalciferol (VITAMIN D) 50 MCG (2000 UT) CAPS Take 2,000 Units by mouth daily with lunch. (Patient not taking: Reported on 04/04/2022)   fluticasone (FLONASE) 50 MCG/ACT nasal spray Place 2 sprays into both nostrils daily.   glucose blood (ONETOUCH ULTRA) test strip Use as instructed   Lancets (ONETOUCH ULTRASOFT) lancets Test at least 2 times a day   levocetirizine (XYZAL) 5 MG tablet Take 1 tablet (5 mg total) by mouth every evening.   levothyroxine (SYNTHROID) 125 MCG tablet TAKE (1) TABLET BY MOUTH ONCE DAILY.   lisinopril (ZESTRIL) 40 MG tablet TAKE (1) TABLET BY MOUTH AT BEDTIME.   metFORMIN (GLUCOPHAGE) 1000 MG tablet TAKE 1 TABLET BY MOUTH TWICE DAILY WITH A MEAL.   PARoxetine (PAXIL) 20 MG tablet TAKE 1 TABLET BY MOUTH DAILY.   [DISCONTINUED] JANUMET 50-1000 MG tablet Take 1 tablet by mouth 2 (two) times daily.   No facility-administered encounter medications on file as of 04/04/2022.    ALLERGIES: No Known Allergies  VACCINATION STATUS: Immunization History  Administered Date(s) Administered   Fluad Quad(high Dose 65+) 04/29/2020, 04/26/2021   Influenza Inj Mdck Quad Pf 05/23/2016   Influenza Inj Mdck Quad  With Preservative 05/24/2017   Influenza, High Dose Seasonal PF 05/25/2018   Influenza,inj,Quad PF,6+ Mos 06/12/2019   Influenza-Unspecified 08/04/2012, 04/22/2013, 05/24/2017   Janssen (J&J) SARS-COV-2 Vaccination 10/17/2019   Pneumococcal Conjugate-13 06/12/2019   Pneumococcal Polysaccharide-23 07/20/2017    Diabetes He presents for his follow-up diabetic visit. He has type 2 diabetes mellitus. Onset time: He was diagnosed at approximate age of 52 years. His disease course has been worsening. There are no hypoglycemic associated symptoms. Pertinent negatives  for hypoglycemia include no confusion, headaches, pallor or seizures. Pertinent negatives for diabetes include no chest pain, no fatigue, no polydipsia, no polyphagia, no polyuria and no weakness. There are no hypoglycemic complications. Symptoms are worsening. There are no diabetic complications. Risk factors for coronary artery disease include diabetes mellitus, dyslipidemia, family history, hypertension, sedentary lifestyle and male sex. Current diabetic treatments: Is currently on Trulicity 1.5 mg subcutaneously weekly, metformin 1000 mg p.o. twice daily. His weight is increasing steadily (He is regaining some of his weight after he lost 25 pounds.). He is following a generally unhealthy diet. When asked about meal planning, he reported none. He has not had a previous visit with a dietitian. He participates in exercise intermittently. His home blood glucose trend is increasing steadily. His breakfast blood glucose range is generally 140-180 mg/dl. His dinner blood glucose range is generally 140-180 mg/dl. His overall blood glucose range is 140-180 mg/dl. (Mr. Debois presents with still above target glycemic profile with point-of-care A1c of 8%.  He did not document any hypoglycemia.  ) An ACE inhibitor/angiotensin II receptor blocker is being taken. Eye exam is current.  Hyperlipidemia This is a chronic problem. The current episode started more  than 1 year ago. Exacerbating diseases include diabetes. Pertinent negatives include no chest pain, myalgias or shortness of breath. Current antihyperlipidemic treatment includes statins. Risk factors for coronary artery disease include dyslipidemia, diabetes mellitus, male sex and family history.  Hypertension This is a chronic problem. The current episode started more than 1 year ago. Pertinent negatives include no chest pain, headaches, neck pain, palpitations or shortness of breath. Risk factors for coronary artery disease include dyslipidemia, diabetes mellitus, family history, male gender and sedentary lifestyle. Past treatments include ACE inhibitors.     Review of Systems  Constitutional:  Negative for chills, fatigue, fever and unexpected weight change.  HENT:  Negative for dental problem, mouth sores and trouble swallowing.   Eyes:  Negative for visual disturbance.  Respiratory:  Negative for cough, choking, chest tightness, shortness of breath and wheezing.   Cardiovascular:  Negative for chest pain, palpitations and leg swelling.  Gastrointestinal:  Negative for abdominal distention, abdominal pain, constipation, diarrhea, nausea and vomiting.  Endocrine: Negative for polydipsia, polyphagia and polyuria.  Genitourinary:  Negative for dysuria, flank pain, hematuria and urgency.  Musculoskeletal:  Negative for back pain, gait problem, myalgias and neck pain.  Skin:  Negative for pallor, rash and wound.  Neurological:  Negative for seizures, syncope, weakness, numbness and headaches.  Psychiatric/Behavioral:  Negative for confusion and dysphoric mood.     Objective:       04/04/2022    9:54 AM 12/27/2021   10:43 AM 11/01/2021    8:32 AM  Vitals with BMI  Height 5' 11.5" 5' 11.5" 5' 11.5"  Weight 197 lbs 201 lbs 3 oz 197 lbs  BMI 27.1 27.78 24.2  Systolic 353 614 431  Diastolic 76 70 78  Pulse 76 51 75    BP 130/76   Pulse 76   Ht 5' 11.5" (1.816 m)   Wt 197 lb (89.4 kg)    BMI 27.09 kg/m   Wt Readings from Last 3 Encounters:  04/04/22 197 lb (89.4 kg)  12/27/21 201 lb 3.2 oz (91.3 kg)  11/01/21 197 lb (89.4 kg)       CMP ( most recent) CMP     Component Value Date/Time   NA 140 12/22/2021 0846   K 4.2 12/22/2021 0846   CL 98 12/22/2021 0846  CO2 26 12/22/2021 0846   GLUCOSE 188 (H) 12/22/2021 0846   GLUCOSE 302 (H) 04/09/2021 2059   BUN 17 12/22/2021 0846   CREATININE 1.22 12/22/2021 0846   CREATININE 1.02 06/22/2020 0847   CALCIUM 9.6 12/22/2021 0846   PROT 6.4 12/22/2021 0846   ALBUMIN 4.5 12/22/2021 0846   AST 14 12/22/2021 0846   ALT 10 12/22/2021 0846   ALKPHOS 76 12/22/2021 0846   BILITOT 1.3 (H) 12/22/2021 0846   GFRNONAA >60 04/09/2021 2059   GFRNONAA 76 06/22/2020 0847   GFRAA 88 06/22/2020 0847     Diabetic Labs (most recent): Lab Results  Component Value Date   HGBA1C 8.0 (A) 04/04/2022   HGBA1C 7.9 (A) 12/27/2021   HGBA1C 6.6 07/28/2021   MICROALBUR 10 04/04/2022     Lipid Panel ( most recent) Lipid Panel     Component Value Date/Time   CHOL 108 04/26/2021 0958   TRIG 120 04/26/2021 0958   HDL 38 (L) 04/26/2021 0958   CHOLHDL 2.4 09/22/2020 0923   CHOLHDL 3.0 09/23/2019 0746   VLDL 25 09/17/2017 0843   LDLCALC 48 04/26/2021 0958   LDLCALC 47 09/23/2019 0746   LABVLDL 22 04/26/2021 0958      Lab Results  Component Value Date   TSH 0.650 12/22/2021   TSH 4.980 (H) 04/26/2021   TSH 0.322 (L) 04/09/2021   TSH 1.240 09/22/2020   TSH 7.26 (H) 09/23/2019   TSH 2.643 09/17/2017   FREET4 1.74 12/22/2021   FREET4 1.39 04/26/2021   FREET4 1.40 09/22/2020      Assessment & Plan:   1. Poorly controlled type 2 diabetes mellitus (HCC)    - GYAN CAMBRE has currently uncontrolled symptomatic type 2 DM since  71 years of age.  Mr. Dougher presents with still above target glycemic profile with point-of-care A1c of 8%.  He did not document any hypoglycemia.    - I had a long discussion with him about the  progressive nature of diabetes and the pathology behind its complications. -He does not report gross complications from diabetes, however, he remains at a high risk for more acute and chronic complications which include CAD, CVA, CKD, retinopathy, and neuropathy. These are all discussed in detail with him.  - I have counseled him on diet  and weight management  by adopting a carbohydrate restricted/protein rich diet. Patient is encouraged to switch to  unprocessed or minimally processed     complex starch and increased protein intake (animal or plant source), fruits, and vegetables. -  he is advised to stick to a routine mealtimes to eat 3 meals  a day and avoid unnecessary snacks ( to snack only to correct hypoglycemia).  In light of his metabolic dysfunction with multiple chronic conditions , he remains a good candidate for lifestyle medicine. - he acknowledges that there is a room for improvement in his food and drink choices. - Suggestion is made for him to avoid simple carbohydrates  from his diet including Cakes, Sweet Desserts, Ice Cream, Soda (diet and regular), Sweet Tea, Candies, Chips, Cookies, Store Bought Juices, Alcohol , Artificial Sweeteners,  Coffee Creamer, and "Sugar-free" Products, Lemonade. This will help patient to have more stable blood glucose profile and potentially avoid unintended weight gain.  The following Lifestyle Medicine recommendations according to [place]  [facility]) were discussed and and offered to patient and he  agrees to start the journey:  A. Whole Foods, Plant-Based Nutrition comprising of fruits and  vegetables, plant-based proteins, whole-grain carbohydrates was discussed in detail with the patient.   A list for source of those nutrients were also provided to the patient.  Patient will use only water or unsweetened tea for hydration. B.  The need to stay away from risky substances including alcohol, smoking; obtaining 7 to 9 hours of  restorative sleep, at least 150 minutes of moderate intensity exercise weekly, the importance of healthy social connections,  and stress management techniques were discussed. C.  A full color page of  Calorie density of various food groups per pound showing examples of each food groups was provided to the patient.    - I have approached him with the following individualized plan to manage  his diabetes and patient agrees:   In light of his loss of control, he will need additional intervention.  I discussed his options with him.  He did use Trulicity in the past.  He may benefit from reintroduction of Trulicity again.  I discussed and prescribed Trulicity 1.5 mg subcutaneously weekly.   -He is advised to continue metformin 1000 mg p.o. twice daily.  He will continue to monitor blood glucose at least once a day-daily before breakfast.  He is advised to call clinic for blood glucose readings greater than 200 mg per DL at fasting.  - Specific targets for  A1c;  LDL, HDL,  and Triglycerides were discussed with the patient.  2) Blood Pressure /Hypertension:   His blood pressure is controlled to target.  The recommended lifestyle nutrition will help him control  hypertension as well.  I He is currently on lisinopril 40 mg once a day and amlodipine 10 mg p.o. once a day.  He has a chance to come off of amlodipine.   3) Lipids/Hyperlipidemia:   Review of his recent lipid panel showed  controlled  LDL at 48 .  he  is advised to continue atorvastatin 10 mg p.o. daily at bedtime.  He will have fasting lipid panel before his next visit.  Side effects and precautions discussed with him.  4)  Weight/Diet:  Body mass index is 27.09 kg/m.  -      he is  a candidate for some more weight loss. I discussed with him the fact that loss of 5 - 10% of his  current body weight will have the most impact on his diabetes management.  Exercise, and detailed carbohydrates information provided  -  detailed on discharge  instructions.  5) hypothyroidism-the circumstance of his diagnosis are not available to review. He is advised to continue Synthroid 125 mcg p.o. daily before breakfast.   - We discussed about the correct intake of his thyroid hormone, on empty stomach at fasting, with water, separated by at least 30 minutes from breakfast and other medications,  and separated by more than 4 hours from calcium, iron, multivitamins, acid reflux medications (PPIs). -Patient is made aware of the fact that thyroid hormone replacement is needed for life, dose to be adjusted by periodic monitoring of thyroid function tests.   6) Chronic Care/Health Maintenance:  -he  is on ACEI/ARB and Statin medications and  is encouraged to initiate and continue to follow up with Ophthalmology, Dentist,  Podiatrist at least yearly or according to recommendations, and advised to   stay away from smoking. I have recommended yearly flu vaccine and pneumonia vaccine at least every 5 years; moderate intensity exercise for up to 150 minutes weekly; and  sleep for at least 7 hours a day.  -  he is  advised to maintain close follow up with Lindell Spar, MD for primary care needs, as well as his other providers for optimal and coordinated care.  I spent 41 minutes in the care of the patient today including review of labs from [place], Lipids, Thyroid Function, Hematology (current and previous including abstractions from other facilities); face-to-face time discussing  his blood glucose readings/logs, discussing hypoglycemia and hyperglycemia episodes and symptoms, medications doses, his options of short and long term treatment based on the latest standards of care / guidelines;  discussion about incorporating lifestyle medicine;  and documenting the encounter. Risk reduction counseling performed per USPSTF guidelines to reduce  obesity and cardiovascular risk factors.     Please refer to Patient Instructions for Blood Glucose Monitoring and  Insulin/Medications Dosing Guide"  in media tab for additional information. Please  also refer to " Patient Self Inventory" in the Media  tab for reviewed elements of pertinent patient history.  Mancel Parsons participated in the discussions, expressed understanding, and voiced agreement with the above plans.  All questions were answered to his satisfaction. he is encouraged to contact clinic should he have any questions or concerns prior to his return visit.    Follow up plan: - Return in about 3 months (around 07/04/2022) for F/U with Pre-visit Labs, Meter/CGM/Logs, A1c here.  Glade Lloyd, MD [facility] [address] [place], [place] [address] Phone: [phone]  Fax: [phone]    04/04/2022, 3:11 PM  This note was partially dictated with voice recognition software. Similar sounding words can be transcribed inadequately or may not  be corrected upon review.

## 2022-04-06 ENCOUNTER — Encounter: Payer: Self-pay | Admitting: Internal Medicine

## 2022-04-06 ENCOUNTER — Other Ambulatory Visit: Payer: Self-pay | Admitting: Cardiology

## 2022-04-06 ENCOUNTER — Other Ambulatory Visit: Payer: Self-pay | Admitting: Internal Medicine

## 2022-04-06 DIAGNOSIS — I10 Essential (primary) hypertension: Secondary | ICD-10-CM

## 2022-04-07 ENCOUNTER — Other Ambulatory Visit: Payer: Self-pay | Admitting: Internal Medicine

## 2022-04-07 DIAGNOSIS — I10 Essential (primary) hypertension: Secondary | ICD-10-CM

## 2022-04-07 DIAGNOSIS — E782 Mixed hyperlipidemia: Secondary | ICD-10-CM

## 2022-04-07 MED ORDER — ATORVASTATIN CALCIUM 10 MG PO TABS: 10.0000 mg | ORAL_TABLET | Freq: Every day | ORAL | 3 refills | Status: DC

## 2022-04-07 MED ORDER — LISINOPRIL 40 MG PO TABS: 40.0000 mg | ORAL_TABLET | Freq: Every day | ORAL | 1 refills | Status: DC

## 2022-04-25 LAB — HM DIABETES EYE EXAM

## 2022-05-08 ENCOUNTER — Telehealth: Payer: Self-pay

## 2022-05-08 NOTE — Telephone Encounter (Signed)
Pt's wife states pt has been experiencing episodes of "a warm feeling", diaphoresis and vomiting since starting trulicity. States Friday he experienced increased belching but never vomited. 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 States he checked his BG at that time and it was 147, he had eaten earlier around 4:30. His BG was 87 last night before bed and this morning 106 before breakfast. Pt's BG average for the past seven days 111, last 14 days 109 and last 30 days 110. He is taking trulicity 1.'5mg'$  weekly but did hold his dose yesterday. Pt's wife states he had similar symptoms the last time he was taking trulicity.

## 2022-05-08 NOTE — Telephone Encounter (Signed)
Discussed with pt's wife, understanding voiced. 

## 2022-05-09 ENCOUNTER — Encounter: Payer: Self-pay | Admitting: *Deleted

## 2022-05-09 ENCOUNTER — Encounter: Payer: Self-pay | Admitting: Internal Medicine

## 2022-05-09 ENCOUNTER — Ambulatory Visit (INDEPENDENT_AMBULATORY_CARE_PROVIDER_SITE_OTHER): Payer: HMO | Admitting: Internal Medicine

## 2022-05-09 VITALS — BP 136/86 | HR 71 | Resp 18 | Ht 72.0 in | Wt 191.8 lb

## 2022-05-09 DIAGNOSIS — I10 Essential (primary) hypertension: Secondary | ICD-10-CM

## 2022-05-09 DIAGNOSIS — Z0001 Encounter for general adult medical examination with abnormal findings: Secondary | ICD-10-CM

## 2022-05-09 DIAGNOSIS — E782 Mixed hyperlipidemia: Secondary | ICD-10-CM

## 2022-05-09 DIAGNOSIS — E1169 Type 2 diabetes mellitus with other specified complication: Secondary | ICD-10-CM

## 2022-05-09 DIAGNOSIS — I2510 Atherosclerotic heart disease of native coronary artery without angina pectoris: Secondary | ICD-10-CM

## 2022-05-09 DIAGNOSIS — Z125 Encounter for screening for malignant neoplasm of prostate: Secondary | ICD-10-CM

## 2022-05-09 DIAGNOSIS — Z23 Encounter for immunization: Secondary | ICD-10-CM | POA: Diagnosis not present

## 2022-05-09 DIAGNOSIS — E039 Hypothyroidism, unspecified: Secondary | ICD-10-CM | POA: Diagnosis not present

## 2022-05-09 NOTE — Progress Notes (Signed)
Established Patient Office Visit  Subjective:  Patient ID: Jim Salazar, male    DOB: 02/24/1951  Age: 71 y.o. MRN: [id number]  CC:  Chief Complaint  Patient presents with   Annual Exam    Annual exam     [name] is a 71 y.o. male with past medical history of CAD, HTN, type II DM, hypothyroidism and HLD who presents for annual physical.  HTN and CAD: BP is well-controlled. Takes medications regularly. Patient denies headache, dizziness, chest pain, dyspnea or palpitations.  Type II DM: His HbA1c was 8.0 in 09/23.  He was recently placed on Trulicity and in addition to metformin.  His 30-day average of blood glucose has been 110 now.  Denies any episode of hypoglycemia.  He denies any polyuria or polyphagia currently.  HLD: He takes atorvastatin.  Hypothyroidism: He has been taking Levothyroxine 125 mcg QD. Denies any constipation, diarrhea, weight change recently, hair or nail changes.  Past Medical History:  Diagnosis Date   CAD (coronary artery disease), native coronary artery    mild nonobstructive plaque in LCx and RCA and mildly obstructive minimally calcified plaque in mid LAD on coronary CTA 11/2017   Depression    Diabetes mellitus without complication (HCC)    GERD (gastroesophageal reflux disease)    Hypertension    Hypothyroidism, unspecified    Left sided numbness 09/17/2017   Major depressive disorder, single episode, unspecified    Need for immunization against influenza 06/12/2019   Need for vaccination against Streptococcus pneumoniae using pneumococcal conjugate vaccine 13 06/12/2019   Obesity, unspecified    Right upper quadrant abdominal pain 09/22/2019   Screening for prostate cancer 06/12/2019   Syncope 10/17/2017   Thyroid disease    Tremor, essential 11/30/2017   Type 2 diabetes mellitus with hyperglycemia (HCC)     Past Surgical History:  Procedure Laterality Date   APPENDECTOMY     COLONOSCOPY     TONSILLECTOMY      Family  History  Problem Relation Age of Onset   Kidney disease Mother    Hypertension Mother    Thyroid disease Mother    Diabetes Mellitus II Mother    Hyperlipidemia Mother    Stroke Mother    Hypertension Father    Cancer Father        lung   Hypertension Other    Colon cancer Neg Hx    Colon polyps Neg Hx    Esophageal cancer Neg Hx    Rectal cancer Neg Hx    Stomach cancer Neg Hx     Social History   Socioeconomic History   Marital status: Married    Spouse name: [name]    Number of children: 2   Years of education: College   Highest education level: Not on file  Occupational History   Occupation: semi retired     Comment: Theme park manager   Tobacco Use   Smoking status: Never   Smokeless tobacco: Never  Vaping Use   Vaping Use: Never used  Substance and Sexual Activity   Alcohol use: No   Drug use: No   Sexual activity: Yes    Birth control/protection: None  Other Topics Concern   Not on file  Social History Narrative   Lives with 2nd wife [name] -married 10 years      3 dogs: 2 inside and 1 outside: Skimp; Ginger; Eve      Enjoys: model railroad Museum/gallery curator, Chiropodist some  Diet: eats all food groups -raisins    Caffeine use: 1-2 cups decaf coffee daily, diet coke sometimes   Water: 4-5 bottles a day       Wears seat belt   Does not use phone while driving-hands free    Smoke detectors at home   Weapons in lock box   Social Determinants of Health   Financial Resource Strain: Low Risk  (01/16/2022)   Overall Financial Resource Strain (CARDIA)    Difficulty of Paying Living Expenses: Not hard at all  Food Insecurity: No Food Insecurity (01/10/2021)   Hunger Vital Sign    Worried About Running Out of Food in the Last Year: Never true    Ran Out of Food in the Last Year: Never true  Transportation Needs: No Transportation Needs (01/16/2022)   PRAPARE - Hydrologist (Medical): No    Lack of Transportation (Non-Medical): No  Physical  Activity: Sufficiently Active (01/16/2022)   Exercise Vital Sign    Days of Exercise per Week: 5 days    Minutes of Exercise per Session: 40 min  Stress: No Stress Concern Present (01/10/2021)   [place]    Feeling of Stress : Not at all  Social Connections: [place] (01/16/2022)   Social Connection and Isolation Panel [NHANES]    Frequency of Communication with Friends and Family: More than three times a week    Frequency of Social Gatherings with Friends and Family: More than three times a week    Attends Religious Services: More than 4 times per year    Active Member of [employer] or Organizations: Yes    Attends Music therapist: More than 4 times per year    Marital Status: Married  Human resources officer Violence: Not At Risk (01/10/2021)   Humiliation, Afraid, Rape, and Kick questionnaire    Fear of Current or Ex-Partner: No    Emotionally Abused: No    Physically Abused: No    Sexually Abused: No    Outpatient Medications Prior to Visit  Medication Sig Dispense Refill   amLODipine (NORVASC) 10 MG tablet TAKE 1 TABLET BY MOUTH AT BEDTIME 90 tablet 0   aspirin EC 81 MG tablet Take 81 mg by mouth every evening.     atorvastatin (LIPITOR) 10 MG tablet Take 1 tablet (10 mg total) by mouth daily. 90 tablet 3   azelastine (ASTELIN) 0.1 % nasal spray Place 1 spray into both nostrils 2 (two) times daily. Use in each nostril as directed 30 mL 12   Blood Glucose Monitoring Suppl (ONE TOUCH ULTRA 2) w/Device KIT USE TO TEST BLOOD GLUCOSE ONCE DAILY AS DIRECTED 1 kit 0   Cholecalciferol (VITAMIN D) 50 MCG (2000 UT) CAPS Take 2,000 Units by mouth daily with lunch.     Dulaglutide (TRULICITY) 1.5 JY/7.8GN SOPN Inject 1.5 mg into the skin once a week. 2 mL 2   fluticasone (FLONASE) 50 MCG/ACT nasal spray Place 2 sprays into both nostrils daily. 16 g 0   glucose blood (ONETOUCH ULTRA) test strip Use as instructed 100  each 2   Lancets (ONETOUCH ULTRASOFT) lancets Test at least 2 times a day 100 each 2   levocetirizine (XYZAL) 5 MG tablet Take 1 tablet (5 mg total) by mouth every evening. 90 tablet 3   levothyroxine (SYNTHROID) 125 MCG tablet TAKE (1) TABLET BY MOUTH ONCE DAILY. 90 tablet 0   lisinopril (ZESTRIL) 40 MG tablet Take  1 tablet (40 mg total) by mouth daily. 90 tablet 1   metFORMIN (GLUCOPHAGE) 1000 MG tablet TAKE 1 TABLET BY MOUTH TWICE DAILY WITH A MEAL. 180 tablet 0   PARoxetine (PAXIL) 20 MG tablet TAKE 1 TABLET BY MOUTH DAILY. 90 tablet 0   No facility-administered medications prior to visit.    No Known Allergies  ROS Review of Systems  Constitutional:  Negative for chills and fever.  HENT:  Negative for congestion, postnasal drip, sinus pressure and sore throat.   Eyes:  Negative for pain and discharge.  Respiratory:  Negative for cough and shortness of breath.   Cardiovascular:  Negative for chest pain and palpitations.  Gastrointestinal:  Negative for constipation, diarrhea and vomiting.  Endocrine: Negative for polydipsia and polyuria.  Genitourinary:  Negative for dysuria and hematuria.  Musculoskeletal:  Negative for neck pain and neck stiffness.  Skin:  Negative for rash.  Neurological:  Negative for dizziness, weakness, numbness and headaches.  Psychiatric/Behavioral:  Negative for agitation and behavioral problems.       Objective:    Physical Exam Vitals reviewed.  Constitutional:      General: He is not in acute distress.    Appearance: He is not diaphoretic.  HENT:     Head: Normocephalic and atraumatic.     Nose: No congestion.     Mouth/Throat:     Mouth: Mucous membranes are moist.  Eyes:     General: No scleral icterus.    Extraocular Movements: Extraocular movements intact.  Neck:     Vascular: No carotid bruit.  Cardiovascular:     Rate and Rhythm: Normal rate and regular rhythm.     Pulses: Normal pulses.     Heart sounds: Normal heart sounds. No  murmur heard. Pulmonary:     Breath sounds: Normal breath sounds. No wheezing or rales.  Abdominal:     Palpations: Abdomen is soft.     Tenderness: There is no abdominal tenderness.  Musculoskeletal:     Cervical back: Neck supple. No tenderness.     Right lower leg: No edema.     Left lower leg: No edema.  Skin:    General: Skin is warm.     Findings: No rash.  Neurological:     General: No focal deficit present.     Mental Status: He is alert and oriented to person, place, and time.     Cranial Nerves: No cranial nerve deficit.     Sensory: No sensory deficit.     Motor: No weakness.  Psychiatric:        Mood and Affect: Mood normal.        Behavior: Behavior normal.     BP 136/86 (BP Location: Right Arm, Patient Position: Sitting, Cuff Size: Normal)   Pulse 71   Resp 18   Ht 6' (1.829 m)   Wt 191 lb 12.8 oz (87 kg)   SpO2 100%   BMI 26.01 kg/m  Wt Readings from Last 3 Encounters:  05/09/22 191 lb 12.8 oz (87 kg)  04/04/22 197 lb (89.4 kg)  12/27/21 201 lb 3.2 oz (91.3 kg)    Lab Results  Component Value Date   TSH 0.650 12/22/2021   Lab Results  Component Value Date   WBC 8.9 04/26/2021   HGB 13.8 04/26/2021   HCT 42.3 04/26/2021   MCV 86 04/26/2021   PLT 421 04/26/2021   Lab Results  Component Value Date   NA 140 12/22/2021   K  4.2 12/22/2021   CO2 26 12/22/2021   GLUCOSE 188 (H) 12/22/2021   BUN 17 12/22/2021   CREATININE 1.22 12/22/2021   BILITOT 1.3 (H) 12/22/2021   ALKPHOS 76 12/22/2021   AST 14 12/22/2021   ALT 10 12/22/2021   PROT 6.4 12/22/2021   ALBUMIN 4.5 12/22/2021   CALCIUM 9.6 12/22/2021   ANIONGAP 11 04/09/2021   EGFR 64 12/22/2021   Lab Results  Component Value Date   CHOL 108 04/26/2021   Lab Results  Component Value Date   HDL 38 (L) 04/26/2021   Lab Results  Component Value Date   LDLCALC 48 04/26/2021   Lab Results  Component Value Date   TRIG 120 04/26/2021   Lab Results  Component Value Date   CHOLHDL 2.4  09/22/2020   Lab Results  Component Value Date   HGBA1C 8.0 (A) 04/04/2022      Assessment & Plan:   Problem List Items Addressed This Visit       Cardiovascular and Mediastinum   Essential hypertension, benign    BP Readings from Last 1 Encounters:  05/09/22 136/86  Well-controlled with Lisinopril and Amlodipine Counseled for compliance with the medications Advised DASH diet and moderate exercise/walking, at least 150 mins/week      CAD (coronary artery disease), native coronary artery    Continue Aspirin 81 mg QD and statin Follows up with Cardiology        Endocrine   Diabetes mellitus ([place])    Lab Results  Component Value Date   HGBA1C 8.0 (A) 04/04/2022  On Metformin and Trulicity Improving now Followed by endocrinology Advised to follow diabetic diet On ACE and statin F/u CMP and lipid panel Diabetic eye exam: Advised to follow up with Ophthalmology for diabetic eye exam      Hypothyroidism    Lab Results  Component Value Date   TSH 0.650 12/22/2021  On Levothyrozine 125 mcg QD Checked TSH and free T4      Relevant Orders   CBC with Differential/Platelet     Other   Prostate cancer screening   Relevant Orders   PSA   Mixed hyperlipidemia    On statin Check lipid profile      Relevant Orders   Lipid Profile   Encounter for general adult medical examination with abnormal findings - Primary    Physical exam as documented. Fasting blood tests reviewed, added CBC, lipid profile and PSA. Flu vaccine today. Advised to get Shingrix and Tdap vaccines at local pharmacy.      Relevant Orders   CBC with Differential/Platelet   Other Visit Diagnoses     Need for immunization against influenza       Relevant Orders   Flu Vaccine QUAD High Dose(Fluad) (Completed)       No orders of the defined types were placed in this encounter.   Follow-up: Return in about 6 months (around 11/08/2022) for DM and HTN.    Lindell Spar, MD

## 2022-05-09 NOTE — Assessment & Plan Note (Signed)
On statin Check lipid profile 

## 2022-05-09 NOTE — Assessment & Plan Note (Addendum)
Lab Results  Component Value Date   TSH 0.650 12/22/2021   On Levothyrozine 125 mcg QD Checked TSH and free T4

## 2022-05-09 NOTE — Assessment & Plan Note (Signed)
Physical exam as documented. Fasting blood tests reviewed, added CBC, lipid profile and PSA. Flu vaccine today. Advised to get Shingrix and Tdap vaccines at local pharmacy.

## 2022-05-09 NOTE — Assessment & Plan Note (Signed)
Lab Results  Component Value Date   HGBA1C 8.0 (A) 04/04/2022   On Metformin and Trulicity Improving now Followed by endocrinology Advised to follow diabetic diet On ACE and statin F/u CMP and lipid panel Diabetic eye exam: Advised to follow up with Ophthalmology for diabetic eye exam

## 2022-05-09 NOTE — Patient Instructions (Signed)
Please continue taking medications as prescribed.  Please continue to follow low carb diet and ambulate as tolerated.  Please consider getting Shingrix and Tdap vaccines at local pharmacy.

## 2022-05-09 NOTE — Assessment & Plan Note (Signed)
BP Readings from Last 1 Encounters:  05/09/22 136/86   Well-controlled with Lisinopril and Amlodipine Counseled for compliance with the medications Advised DASH diet and moderate exercise/walking, at least 150 mins/week

## 2022-05-09 NOTE — Assessment & Plan Note (Signed)
Continue Aspirin 81 mg QD and statin Follows up with Cardiology

## 2022-05-10 ENCOUNTER — Other Ambulatory Visit: Payer: Self-pay

## 2022-05-10 DIAGNOSIS — E119 Type 2 diabetes mellitus without complications: Secondary | ICD-10-CM

## 2022-05-10 MED ORDER — FREESTYLE LIBRE 2 READER DEVI: 0 refills | Status: DC

## 2022-05-10 MED ORDER — FREESTYLE LIBRE 2 SENSOR MISC: 2 refills | Status: DC

## 2022-05-11 LAB — CBC WITH DIFFERENTIAL/PLATELET
Basophils Absolute: 0.1 10*3/uL (ref 0.0–0.2)
Basos: 1 %
EOS (ABSOLUTE): 0.1 10*3/uL (ref 0.0–0.4)
Eos: 2 %
Hematocrit: 42.9 % (ref 37.5–51.0)
Hemoglobin: 14.4 g/dL (ref 13.0–17.7)
Immature Grans (Abs): 0 10*3/uL (ref 0.0–0.1)
Immature Granulocytes: 1 %
Lymphocytes Absolute: 1.7 10*3/uL (ref 0.7–3.1)
Lymphs: 27 %
MCH: 29.6 pg (ref 26.6–33.0)
MCHC: 33.6 g/dL (ref 31.5–35.7)
MCV: 88 fL (ref 79–97)
Monocytes Absolute: 0.5 10*3/uL (ref 0.1–0.9)
Monocytes: 9 %
Neutrophils Absolute: 3.9 10*3/uL (ref 1.4–7.0)
Neutrophils: 60 %
Platelets: 228 10*3/uL (ref 150–450)
RBC: 4.87 x10E6/uL (ref 4.14–5.80)
RDW: 12.8 % (ref 11.6–15.4)
WBC: 6.4 10*3/uL (ref 3.4–10.8)

## 2022-05-11 LAB — LIPID PANEL
Chol/HDL Ratio: 2 ratio (ref 0.0–5.0)
Cholesterol, Total: 91 mg/dL — ABNORMAL LOW (ref 100–199)
HDL: 45 mg/dL (ref >39)
LDL Chol Calc (NIH): 32 mg/dL (ref 0–99)
Triglycerides: 61 mg/dL (ref 0–149)
VLDL Cholesterol Cal: 14 mg/dL (ref 5–40)

## 2022-05-11 LAB — PSA: Prostate Specific Ag, Serum: 3.5 ng/mL (ref 0.0–4.0)

## 2022-05-23 ENCOUNTER — Other Ambulatory Visit: Payer: Self-pay | Admitting: "Endocrinology

## 2022-05-23 DIAGNOSIS — E1169 Type 2 diabetes mellitus with other specified complication: Secondary | ICD-10-CM

## 2022-07-04 ENCOUNTER — Other Ambulatory Visit: Payer: Self-pay | Admitting: Internal Medicine

## 2022-07-04 DIAGNOSIS — I10 Essential (primary) hypertension: Secondary | ICD-10-CM

## 2022-07-06 ENCOUNTER — Ambulatory Visit: Payer: HMO | Admitting: "Endocrinology

## 2022-07-26 ENCOUNTER — Ambulatory Visit: Payer: HMO | Admitting: "Endocrinology

## 2022-07-29 ENCOUNTER — Other Ambulatory Visit: Payer: Self-pay | Admitting: Internal Medicine

## 2022-07-29 DIAGNOSIS — J309 Allergic rhinitis, unspecified: Secondary | ICD-10-CM

## 2022-07-29 DIAGNOSIS — E039 Hypothyroidism, unspecified: Secondary | ICD-10-CM

## 2022-08-14 DIAGNOSIS — E119 Type 2 diabetes mellitus without complications: Secondary | ICD-10-CM | POA: Diagnosis not present

## 2022-08-15 LAB — LIPID PANEL
Chol/HDL Ratio: 2.2 ratio (ref 0.0–5.0)
Cholesterol, Total: 116 mg/dL (ref 100–199)
HDL: 52 mg/dL (ref >39)
LDL Chol Calc (NIH): 50 mg/dL (ref 0–99)
Triglycerides: 68 mg/dL (ref 0–149)
VLDL Cholesterol Cal: 14 mg/dL (ref 5–40)

## 2022-08-15 LAB — COMPREHENSIVE METABOLIC PANEL
ALT: 10 IU/L (ref 0–44)
AST: 14 IU/L (ref 0–40)
Albumin/Globulin Ratio: 2.3 — ABNORMAL HIGH (ref 1.2–2.2)
Albumin: 4.5 g/dL (ref 3.9–4.9)
Alkaline Phosphatase: 75 IU/L (ref 44–121)
BUN/Creatinine Ratio: 17 (ref 10–24)
BUN: 19 mg/dL (ref 8–27)
Bilirubin Total: 1 mg/dL (ref 0.0–1.2)
CO2: 26 mmol/L (ref 20–29)
Calcium: 9.8 mg/dL (ref 8.6–10.2)
Chloride: 101 mmol/L (ref 96–106)
Creatinine, Ser: 1.13 mg/dL (ref 0.76–1.27)
Globulin, Total: 2 g/dL (ref 1.5–4.5)
Glucose: 158 mg/dL — ABNORMAL HIGH (ref 70–99)
Potassium: 4.2 mmol/L (ref 3.5–5.2)
Sodium: 140 mmol/L (ref 134–144)
Total Protein: 6.5 g/dL (ref 6.0–8.5)
eGFR: 70 mL/min/{1.73_m2} (ref >59)

## 2022-08-15 LAB — VITAMIN D 25 HYDROXY (VIT D DEFICIENCY, FRACTURES): Vit D, 25-Hydroxy: 15.2 ng/mL — ABNORMAL LOW (ref 30.0–100.0)

## 2022-08-15 LAB — TSH: TSH: 3.09 u[IU]/mL (ref 0.450–4.500)

## 2022-08-15 LAB — T4, FREE: Free T4: 1.41 ng/dL (ref 0.82–1.77)

## 2022-08-16 ENCOUNTER — Encounter: Payer: Self-pay | Admitting: "Endocrinology

## 2022-08-16 ENCOUNTER — Ambulatory Visit (INDEPENDENT_AMBULATORY_CARE_PROVIDER_SITE_OTHER): Payer: PPO | Admitting: "Endocrinology

## 2022-08-16 VITALS — BP 130/76 | HR 60 | Ht 72.0 in | Wt 188.0 lb

## 2022-08-16 DIAGNOSIS — E782 Mixed hyperlipidemia: Secondary | ICD-10-CM

## 2022-08-16 DIAGNOSIS — E039 Hypothyroidism, unspecified: Secondary | ICD-10-CM | POA: Diagnosis not present

## 2022-08-16 DIAGNOSIS — E559 Vitamin D deficiency, unspecified: Secondary | ICD-10-CM | POA: Insufficient documentation

## 2022-08-16 DIAGNOSIS — E119 Type 2 diabetes mellitus without complications: Secondary | ICD-10-CM

## 2022-08-16 DIAGNOSIS — I10 Essential (primary) hypertension: Secondary | ICD-10-CM

## 2022-08-16 LAB — POCT GLYCOSYLATED HEMOGLOBIN (HGB A1C): HbA1c, POC (controlled diabetic range): 6.7 % (ref 0.0–7.0)

## 2022-08-16 MED ORDER — VITAMIN D3 125 MCG (5000 UT) PO CAPS: 5000.0000 [IU] | ORAL_CAPSULE | Freq: Every day | ORAL | 0 refills | Status: AC

## 2022-08-16 NOTE — Progress Notes (Unsigned)
08/16/2022, 7:50 PM  Endocrinology follow-up note   Subjective:    Patient ID: Jim Salazar, male    DOB: 1951-07-20.  Jim Salazar is being seen in follow-up after he was seen in consultation for  management of currently uncontrolled symptomatic diabetes requested by  Lindell Spar, MD.   Past Medical History:  Diagnosis Date   CAD (coronary artery disease), native coronary artery    mild nonobstructive plaque in LCx and RCA and mildly obstructive minimally calcified plaque in mid LAD on coronary CTA 11/2017   Depression    Diabetes mellitus without complication (HCC)    GERD (gastroesophageal reflux disease)    Hypertension    Hypothyroidism, unspecified    Left sided numbness 09/17/2017   Major depressive disorder, single episode, unspecified    Need for immunization against influenza 06/12/2019   Need for vaccination against Streptococcus pneumoniae using pneumococcal conjugate vaccine 13 06/12/2019   Obesity, unspecified    Right upper quadrant abdominal pain 09/22/2019   Screening for prostate cancer 06/12/2019   Syncope 10/17/2017   Thyroid disease    Tremor, essential 11/30/2017   Type 2 diabetes mellitus with hyperglycemia (HCC)     Past Surgical History:  Procedure Laterality Date   APPENDECTOMY     COLONOSCOPY     TONSILLECTOMY      Social History   Socioeconomic History   Marital status: Married    Spouse name: [name]    Number of children: 2   Years of education: College   Highest education level: Not on file  Occupational History   Occupation: semi retired     Comment: Theme park manager   Tobacco Use   Smoking status: Never   Smokeless tobacco: Never  Vaping Use   Vaping Use: Never used  Substance and Sexual Activity   Alcohol use: No   Drug use: No   Sexual activity: Yes    Birth control/protection: None  Other Topics Concern   Not on file  Social History Narrative   Lives with 2nd wife [name] -married 10 years      3  dogs: 2 inside and 1 outside: Skimp; Ginger; Eve      Enjoys: model railroad Museum/gallery curator, Chiropodist some      Diet: eats all food groups -raisins    Caffeine use: 1-2 cups decaf coffee daily, diet coke sometimes   Water: 4-5 bottles a day       Wears seat belt   Does not use phone while driving-hands free    Oceanographer at home   Weapons in lock box   Social Determinants of Health   Financial Resource Strain: Low Risk  (01/16/2022)   Overall Financial Resource Strain (CARDIA)    Difficulty of Paying Living Expenses: Not hard at all  Food Insecurity: No Food Insecurity (01/10/2021)   Hunger Vital Sign    Worried About Running Out of Food in the Last Year: Never true    [place] in the Last Year: Never true  Transportation Needs: No Transportation Needs (01/16/2022)   PRAPARE - Hydrologist (Medical): No    Lack of Transportation (Non-Medical): No  Physical Activity: Sufficiently Active (01/16/2022)   Exercise Vital Sign    Days of Exercise per Week: 5 days    Minutes of Exercise per Session: 40 min  Stress: No Stress Concern Present (01/10/2021)   [place]  Feeling of Stress : Not at all  Social Connections: Socially Integrated (01/16/2022)   Social Connection and Isolation Panel [NHANES]    Frequency of Communication with Friends and Family: More than three times a week    Frequency of Social Gatherings with Friends and Family: More than three times a week    Attends Religious Services: More than 4 times per year    Active Member of [employer] or Organizations: Yes    Attends Music therapist: More than 4 times per year    Marital Status: Married    Family History  Problem Relation Age of Onset   Kidney disease Mother    Hypertension Mother    Thyroid disease Mother    Diabetes Mellitus II Mother    Hyperlipidemia Mother    Stroke Mother    Hypertension Father     Cancer Father        lung   Hypertension Other    Colon cancer Neg Hx    Colon polyps Neg Hx    Esophageal cancer Neg Hx    Rectal cancer Neg Hx    Stomach cancer Neg Hx     Outpatient Encounter Medications as of 08/16/2022  Medication Sig   Cholecalciferol (VITAMIN D3) 125 MCG (5000 UT) CAPS Take 1 capsule (5,000 Units total) by mouth daily.   amLODipine (NORVASC) 10 MG tablet TAKE 1 TABLET BY MOUTH AT BEDTIME   aspirin EC 81 MG tablet Take 81 mg by mouth every evening.   atorvastatin (LIPITOR) 10 MG tablet Take 1 tablet (10 mg total) by mouth daily.   azelastine (ASTELIN) 0.1 % nasal spray Place 1 spray into both nostrils 2 (two) times daily. Use in each nostril as directed   Blood Glucose Monitoring Suppl (ONE TOUCH ULTRA 2) w/Device KIT USE TO TEST BLOOD GLUCOSE ONCE DAILY AS DIRECTED   Continuous Blood Gluc Receiver (FREESTYLE LIBRE 2 READER) DEVI Use to test blood glucose continuously. Dx code E11.9   Continuous Blood Gluc Sensor (FREESTYLE LIBRE 2 SENSOR) MISC Apply to arm every 14 days to check blood glucose continuously. Dx Code E11.9   glucose blood (ONETOUCH ULTRA) test strip Use as instructed   Lancets (ONETOUCH ULTRASOFT) lancets Test at least 2 times a day   levocetirizine (XYZAL) 5 MG tablet Take 1 tablet (5 mg total) by mouth every evening.   levothyroxine (SYNTHROID) 125 MCG tablet TAKE (1) TABLET BY MOUTH ONCE DAILY.   lisinopril (ZESTRIL) 40 MG tablet Take 1 tablet (40 mg total) by mouth daily.   metFORMIN (GLUCOPHAGE) 1000 MG tablet TAKE 1 TABLET BY MOUTH TWICE DAILY WITH A MEAL.   PARoxetine (PAXIL) 20 MG tablet TAKE 1 TABLET BY MOUTH DAILY.   [DISCONTINUED] Cholecalciferol (VITAMIN D) 50 MCG (2000 UT) CAPS Take 2,000 Units by mouth daily with lunch.   [DISCONTINUED] Dulaglutide (TRULICITY) 1.5 JO/8.4ZY SOPN Inject 1.5 mg into the skin once a week.   [DISCONTINUED] fluticasone (FLONASE) 50 MCG/ACT nasal spray Place 2 sprays into both nostrils daily.   [DISCONTINUED]  JANUMET 50-1000 MG tablet Take 1 tablet by mouth 2 (two) times daily.   No facility-administered encounter medications on file as of 08/16/2022.    ALLERGIES: No Known Allergies  VACCINATION STATUS: Immunization History  Administered Date(s) Administered   Fluad Quad(high Dose 65+) 04/29/2020, 04/26/2021, 05/09/2022   Influenza Inj Mdck Quad Pf 05/23/2016   Influenza Inj Mdck Quad With Preservative 05/24/2017   Influenza, High Dose Seasonal PF 05/25/2018   Influenza,inj,Quad PF,6+ Mos  06/12/2019   Influenza-Unspecified 08/04/2012, 04/22/2013, 05/24/2017   Janssen (J&J) SARS-COV-2 Vaccination 10/17/2019   Pneumococcal Conjugate-13 06/12/2019   Pneumococcal Polysaccharide-23 07/20/2017    Diabetes He presents for his follow-up diabetic visit. He has type 2 diabetes mellitus. Onset time: He was diagnosed at approximate age of 47 years. His disease course has been worsening. There are no hypoglycemic associated symptoms. Pertinent negatives for hypoglycemia include no confusion, headaches, pallor or seizures. Pertinent negatives for diabetes include no chest pain, no fatigue, no polydipsia, no polyphagia, no polyuria and no weakness. There are no hypoglycemic complications. Symptoms are worsening. There are no diabetic complications. Risk factors for coronary artery disease include diabetes mellitus, dyslipidemia, family history, hypertension, sedentary lifestyle and male sex. Current diabetic treatments: Is currently on Trulicity 1.5 mg subcutaneously weekly, metformin 1000 mg p.o. twice daily. His weight is increasing steadily (He is regaining some of his weight after he lost 25 pounds.). He is following a generally unhealthy diet. When asked about meal planning, he reported none. He has not had a previous visit with a dietitian. He participates in exercise intermittently. His home blood glucose trend is increasing steadily. His breakfast blood glucose range is generally 140-180 mg/dl. His dinner  blood glucose range is generally 140-180 mg/dl. His overall blood glucose range is 140-180 mg/dl. (Mr. Pehl presents with still above target glycemic profile with point-of-care A1c of 8%.  He did not document any hypoglycemia.  ) An ACE inhibitor/angiotensin II receptor blocker is being taken. Eye exam is current.  Hyperlipidemia This is a chronic problem. The current episode started more than 1 year ago. Exacerbating diseases include diabetes. Pertinent negatives include no chest pain, myalgias or shortness of breath. Current antihyperlipidemic treatment includes statins. Risk factors for coronary artery disease include dyslipidemia, diabetes mellitus, male sex and family history.  Hypertension This is a chronic problem. The current episode started more than 1 year ago. Pertinent negatives include no chest pain, headaches, neck pain, palpitations or shortness of breath. Risk factors for coronary artery disease include dyslipidemia, diabetes mellitus, family history, male gender and sedentary lifestyle. Past treatments include ACE inhibitors.     Review of Systems  Constitutional:  Negative for chills, fatigue, fever and unexpected weight change.  HENT:  Negative for dental problem, mouth sores and trouble swallowing.   Eyes:  Negative for visual disturbance.  Respiratory:  Negative for cough, choking, chest tightness, shortness of breath and wheezing.   Cardiovascular:  Negative for chest pain, palpitations and leg swelling.  Gastrointestinal:  Negative for abdominal distention, abdominal pain, constipation, diarrhea, nausea and vomiting.  Endocrine: Negative for polydipsia, polyphagia and polyuria.  Genitourinary:  Negative for dysuria, flank pain, hematuria and urgency.  Musculoskeletal:  Negative for back pain, gait problem, myalgias and neck pain.  Skin:  Negative for pallor, rash and wound.  Neurological:  Negative for seizures, syncope, weakness, numbness and headaches.   Psychiatric/Behavioral:  Negative for confusion and dysphoric mood.     Objective:       08/16/2022    3:47 PM 05/09/2022    8:07 AM 04/04/2022    9:54 AM  Vitals with BMI  Height '6\' 0"'$  '6\' 0"'$  5' 11.5"  Weight 188 lbs 191 lbs 13 oz 197 lbs  BMI 25.49 60.10 93.2  Systolic 355 732 202  Diastolic 76 86 76  Pulse 60 71 76    BP 130/76   Pulse 60   Ht 6' (1.829 m)   Wt 188 lb (85.3 kg)   BMI  25.50 kg/m   Wt Readings from Last 3 Encounters:  08/16/22 188 lb (85.3 kg)  05/09/22 191 lb 12.8 oz (87 kg)  04/04/22 197 lb (89.4 kg)       CMP ( most recent) CMP     Component Value Date/Time   NA 140 08/14/2022 0854   K 4.2 08/14/2022 0854   CL 101 08/14/2022 0854   CO2 26 08/14/2022 0854   GLUCOSE 158 (H) 08/14/2022 0854   GLUCOSE 302 (H) 04/09/2021 2059   BUN 19 08/14/2022 0854   CREATININE 1.13 08/14/2022 0854   CREATININE 1.02 06/22/2020 0847   CALCIUM 9.8 08/14/2022 0854   PROT 6.5 08/14/2022 0854   ALBUMIN 4.5 08/14/2022 0854   AST 14 08/14/2022 0854   ALT 10 08/14/2022 0854   ALKPHOS 75 08/14/2022 0854   BILITOT 1.0 08/14/2022 0854   GFRNONAA >60 04/09/2021 2059   GFRNONAA 76 06/22/2020 0847   GFRAA 88 06/22/2020 0847     Diabetic Labs (most recent): Lab Results  Component Value Date   HGBA1C 6.7 08/16/2022   HGBA1C 8.0 (A) 04/04/2022   HGBA1C 7.9 (A) 12/27/2021   MICROALBUR 10 04/04/2022     Lipid Panel ( most recent) Lipid Panel     Component Value Date/Time   CHOL 116 08/14/2022 0854   TRIG 68 08/14/2022 0854   HDL 52 08/14/2022 0854   CHOLHDL 2.2 08/14/2022 0854   CHOLHDL 3.0 09/23/2019 0746   VLDL 25 09/17/2017 0843   LDLCALC 50 08/14/2022 0854   LDLCALC 47 09/23/2019 0746   LABVLDL 14 08/14/2022 0854      Lab Results  Component Value Date   TSH 3.090 08/14/2022   TSH 0.650 12/22/2021   TSH 4.980 (H) 04/26/2021   TSH 0.322 (L) 04/09/2021   TSH 1.240 09/22/2020   TSH 7.26 (H) 09/23/2019   TSH 2.643 09/17/2017   FREET4 1.41  08/14/2022   FREET4 1.74 12/22/2021   FREET4 1.39 04/26/2021   FREET4 1.40 09/22/2020      Assessment & Plan:   1. Poorly controlled type 2 diabetes mellitus (HCC)    - Jim Salazar has currently uncontrolled symptomatic type 2 DM since  72 years of age.  Jim Salazar presents with still above target glycemic profile with point-of-care A1c of 8%.  He did not document any hypoglycemia.    - I had a long discussion with him about the progressive nature of diabetes and the pathology behind its complications. -He does not report gross complications from diabetes, however, he remains at a high risk for more acute and chronic complications which include CAD, CVA, CKD, retinopathy, and neuropathy. These are all discussed in detail with him.  - I have counseled him on diet  and weight management  by adopting a carbohydrate restricted/protein rich diet. Patient is encouraged to switch to  unprocessed or minimally processed     complex starch and increased protein intake (animal or plant source), fruits, and vegetables. -  he is advised to stick to a routine mealtimes to eat 3 meals  a day and avoid unnecessary snacks ( to snack only to correct hypoglycemia).  In light of his metabolic dysfunction with multiple chronic conditions , he remains a good candidate for lifestyle medicine. - he acknowledges that there is a room for improvement in his food and drink choices. - Suggestion is made for him to avoid simple carbohydrates  from his diet including Cakes, Sweet Desserts, Ice Cream, Soda (diet and regular), Sweet  Tea, Candies, Chips, Cookies, Store Bought Juices, Alcohol , Artificial Sweeteners,  Coffee Creamer, and "Sugar-free" Products, Lemonade. This will help patient to have more stable blood glucose profile and potentially avoid unintended weight gain.  The following Lifestyle Medicine recommendations according to [place]  [facility]) were discussed and and offered to  patient and he  agrees to start the journey:  A. Whole Foods, Plant-Based Nutrition comprising of fruits and vegetables, plant-based proteins, whole-grain carbohydrates was discussed in detail with the patient.   A list for source of those nutrients were also provided to the patient.  Patient will use only water or unsweetened tea for hydration. B.  The need to stay away from risky substances including alcohol, smoking; obtaining 7 to 9 hours of restorative sleep, at least 150 minutes of moderate intensity exercise weekly, the importance of healthy social connections,  and stress management techniques were discussed. C.  A full color page of  Calorie density of various food groups per pound showing examples of each food groups was provided to the patient.    - I have approached him with the following individualized plan to manage  his diabetes and patient agrees:   In light of his loss of control, he will need additional intervention.  I discussed his options with him.  He did use Trulicity in the past.  He may benefit from reintroduction of Trulicity again.  I discussed and prescribed Trulicity 1.5 mg subcutaneously weekly.   -He is advised to continue metformin 1000 mg p.o. twice daily.  He will continue to monitor blood glucose at least once a day-daily before breakfast.  He is advised to call clinic for blood glucose readings greater than 200 mg per DL at fasting.  - Specific targets for  A1c;  LDL, HDL,  and Triglycerides were discussed with the patient.  2) Blood Pressure /Hypertension:   His blood pressure is controlled to target.  The recommended lifestyle nutrition will help him control  hypertension as well.  I He is currently on lisinopril 40 mg once a day and amlodipine 10 mg p.o. once a day.  He has a chance to come off of amlodipine.   3) Lipids/Hyperlipidemia:   Review of his recent lipid panel showed  controlled  LDL at 48 .  he  is advised to continue atorvastatin 10 mg p.o. daily  at bedtime.  He will have fasting lipid panel before his next visit.  Side effects and precautions discussed with him.  4)  Weight/Diet:  Body mass index is 25.5 kg/m.  -      he is  a candidate for some more weight loss. I discussed with him the fact that loss of 5 - 10% of his  current body weight will have the most impact on his diabetes management.  Exercise, and detailed carbohydrates information provided  -  detailed on discharge instructions.  5) hypothyroidism-the circumstance of his diagnosis are not available to review. He is advised to continue Synthroid 125 mcg p.o. daily before breakfast.   - We discussed about the correct intake of his thyroid hormone, on empty stomach at fasting, with water, separated by at least 30 minutes from breakfast and other medications,  and separated by more than 4 hours from calcium, iron, multivitamins, acid reflux medications (PPIs). -Patient is made aware of the fact that thyroid hormone replacement is needed for life, dose to be adjusted by periodic monitoring of thyroid function tests.   6) Chronic  Care/Health Maintenance:  -he  is on ACEI/ARB and Statin medications and  is encouraged to initiate and continue to follow up with Ophthalmology, Dentist,  Podiatrist at least yearly or according to recommendations, and advised to   stay away from smoking. I have recommended yearly flu vaccine and pneumonia vaccine at least every 5 years; moderate intensity exercise for up to 150 minutes weekly; and  sleep for at least 7 hours a day.  - he is  advised to maintain close follow up with Lindell Spar, MD for primary care needs, as well as his other providers for optimal and coordinated care.  I spent 41 minutes in the care of the patient today including review of labs from [place], Lipids, Thyroid Function, Hematology (current and previous including abstractions from other facilities); face-to-face time discussing  his blood glucose readings/logs, discussing  hypoglycemia and hyperglycemia episodes and symptoms, medications doses, his options of short and long term treatment based on the latest standards of care / guidelines;  discussion about incorporating lifestyle medicine;  and documenting the encounter. Risk reduction counseling performed per USPSTF guidelines to reduce  obesity and cardiovascular risk factors.     Please refer to Patient Instructions for Blood Glucose Monitoring and Insulin/Medications Dosing Guide"  in media tab for additional information. Please  also refer to " Patient Self Inventory" in the Media  tab for reviewed elements of pertinent patient history.  Mancel Parsons participated in the discussions, expressed understanding, and voiced agreement with the above plans.  All questions were answered to his satisfaction. he is encouraged to contact clinic should he have any questions or concerns prior to his return visit.    Follow up plan: - Return in about 4 months (around 12/15/2022) for Bring Meter/CGM Device/Logs- A1c in Office, Urine MA - [place].  Glade Lloyd, MD [facility] [address] [place],  [address] Phone: [phone]  Fax: [phone]    08/16/2022, 7:50 PM  This note was partially dictated with voice recognition software. Similar sounding words can be transcribed inadequately or may not  be corrected upon review.

## 2022-08-16 NOTE — Patient Instructions (Signed)

## 2022-08-24 HISTORY — PX: HAND SURGERY: SHX662

## 2022-08-25 ENCOUNTER — Other Ambulatory Visit: Payer: Self-pay | Admitting: "Endocrinology

## 2022-08-25 DIAGNOSIS — E1169 Type 2 diabetes mellitus with other specified complication: Secondary | ICD-10-CM

## 2022-08-28 ENCOUNTER — Other Ambulatory Visit: Payer: Self-pay

## 2022-08-28 DIAGNOSIS — E1169 Type 2 diabetes mellitus with other specified complication: Secondary | ICD-10-CM

## 2022-08-28 MED ORDER — METFORMIN HCL 1000 MG PO TABS: 1000.0000 mg | ORAL_TABLET | Freq: Two times a day (BID) | ORAL | 0 refills | Status: DC

## 2022-10-01 ENCOUNTER — Other Ambulatory Visit: Payer: Self-pay | Admitting: Internal Medicine

## 2022-10-01 DIAGNOSIS — I10 Essential (primary) hypertension: Secondary | ICD-10-CM

## 2022-10-02 ENCOUNTER — Other Ambulatory Visit: Payer: Self-pay | Admitting: "Endocrinology

## 2022-10-02 DIAGNOSIS — E119 Type 2 diabetes mellitus without complications: Secondary | ICD-10-CM

## 2022-10-02 MED ORDER — AMLODIPINE BESYLATE 10 MG PO TABS: 10.0000 mg | ORAL_TABLET | Freq: Every day | ORAL | 0 refills | Status: DC

## 2022-10-02 MED ORDER — PAROXETINE HCL 20 MG PO TABS: 20.0000 mg | ORAL_TABLET | Freq: Every day | ORAL | 0 refills | Status: DC

## 2022-10-31 ENCOUNTER — Other Ambulatory Visit: Payer: Self-pay | Admitting: Internal Medicine

## 2022-10-31 DIAGNOSIS — E039 Hypothyroidism, unspecified: Secondary | ICD-10-CM

## 2022-11-02 ENCOUNTER — Other Ambulatory Visit: Payer: Self-pay | Admitting: "Endocrinology

## 2022-11-02 DIAGNOSIS — E119 Type 2 diabetes mellitus without complications: Secondary | ICD-10-CM

## 2022-11-09 ENCOUNTER — Ambulatory Visit: Payer: HMO | Admitting: Internal Medicine

## 2022-11-13 ENCOUNTER — Ambulatory Visit (INDEPENDENT_AMBULATORY_CARE_PROVIDER_SITE_OTHER): Payer: PPO | Admitting: Internal Medicine

## 2022-11-13 ENCOUNTER — Other Ambulatory Visit: Payer: Self-pay | Admitting: Internal Medicine

## 2022-11-13 ENCOUNTER — Encounter: Payer: Self-pay | Admitting: Internal Medicine

## 2022-11-13 VITALS — BP 135/82 | HR 67 | Ht 72.0 in | Wt 188.0 lb

## 2022-11-13 DIAGNOSIS — E039 Hypothyroidism, unspecified: Secondary | ICD-10-CM | POA: Diagnosis not present

## 2022-11-13 DIAGNOSIS — M25512 Pain in left shoulder: Secondary | ICD-10-CM | POA: Diagnosis not present

## 2022-11-13 DIAGNOSIS — E1169 Type 2 diabetes mellitus with other specified complication: Secondary | ICD-10-CM

## 2022-11-13 DIAGNOSIS — E559 Vitamin D deficiency, unspecified: Secondary | ICD-10-CM

## 2022-11-13 DIAGNOSIS — F3341 Major depressive disorder, recurrent, in partial remission: Secondary | ICD-10-CM | POA: Diagnosis not present

## 2022-11-13 DIAGNOSIS — I2510 Atherosclerotic heart disease of native coronary artery without angina pectoris: Secondary | ICD-10-CM

## 2022-11-13 DIAGNOSIS — I10 Essential (primary) hypertension: Secondary | ICD-10-CM | POA: Diagnosis not present

## 2022-11-13 DIAGNOSIS — E782 Mixed hyperlipidemia: Secondary | ICD-10-CM | POA: Diagnosis not present

## 2022-11-13 DIAGNOSIS — M720 Palmar fascial fibromatosis [Dupuytren]: Secondary | ICD-10-CM

## 2022-11-13 MED ORDER — LISINOPRIL 20 MG PO TABS: 20.0000 mg | ORAL_TABLET | Freq: Every day | ORAL | 1 refills | Status: DC

## 2022-11-13 NOTE — Assessment & Plan Note (Signed)
Continue Aspirin 81 mg QD and statin Follows up with Cardiology 

## 2022-11-13 NOTE — Assessment & Plan Note (Signed)
Lab Results  Component Value Date   TSH 3.090 08/14/2022   On Levothyrozine 125 mcg QD Checked TSH and free T4

## 2022-11-13 NOTE — Assessment & Plan Note (Signed)
On statin Check lipid profile 

## 2022-11-13 NOTE — Assessment & Plan Note (Signed)
BP Readings from Last 1 Encounters:  11/13/22 135/82   Well-controlled with Lisinopril and Amlodipine Counseled for compliance with the medications Advised DASH diet and moderate exercise/walking, at least 150 mins/week

## 2022-11-13 NOTE — Assessment & Plan Note (Addendum)
Lab Results  Component Value Date   HGBA1C 6.7 08/16/2022   On Metformin Well-controlled Followed by endocrinology Advised to follow diabetic diet On ACE and statin F/u CMP and lipid panel Diabetic eye exam: Advised to follow up with Ophthalmology for diabetic eye exam

## 2022-11-13 NOTE — Progress Notes (Signed)
Established Patient Office Visit  Subjective:  Patient ID: Jim Salazar, male    DOB: March 27, 1951  Age: 72 y.o. MRN: [id number]  CC:  Chief Complaint  Patient presents with   Hypertension    Six month follow up. Patient states he has pain in his left shoulder that goes down into his hand    [name] is a 72 y.o. male who presents for f/u of his chronic medical conditions.  HTN: BP is well-controlled. Takes medications regularly. Patient denies headache, dizziness, chest pain, dyspnea or palpitations.   DM: His HbA1C has improved to 6.6 from 9.1 now.  He has been trying to follow low-carb diet with the help of nutritionist.  Denies any polyuria or polydipsia.   Hypothyroidism: He has been taking Levothyroxine 125 mcg QD. Denies any constipation, diarrhea, weight change recently, hair or nail changes.  He reports left shoulder pain, which is intermittent since 11/03/22. Denies any recent injury. He has radiating pain to left arm and elbow. He reports improvement with Tylenol. He also reports band like tightening in his left hand, which is chronic, but has been flexing his hand and affects his daily functioning.   Past Medical History:  Diagnosis Date   CAD (coronary artery disease), native coronary artery    mild nonobstructive plaque in LCx and RCA and mildly obstructive minimally calcified plaque in mid LAD on coronary CTA 11/2017   Depression    Diabetes mellitus without complication (HCC)    GERD (gastroesophageal reflux disease)    Hypertension    Hypothyroidism, unspecified    Left sided numbness 09/17/2017   Major depressive disorder, single episode, unspecified    Need for immunization against influenza 06/12/2019   Need for vaccination against Streptococcus pneumoniae using pneumococcal conjugate vaccine 13 06/12/2019   Obesity, unspecified    Right upper quadrant abdominal pain 09/22/2019   Screening for prostate cancer 06/12/2019   Syncope 10/17/2017    Thyroid disease    Tremor, essential 11/30/2017   Type 2 diabetes mellitus with hyperglycemia (HCC)     Past Surgical History:  Procedure Laterality Date   APPENDECTOMY     COLONOSCOPY     TONSILLECTOMY      Family History  Problem Relation Age of Onset   Kidney disease Mother    Hypertension Mother    Thyroid disease Mother    Diabetes Mellitus II Mother    Hyperlipidemia Mother    Stroke Mother    Hypertension Father    Cancer Father        lung   Hypertension Other    Colon cancer Neg Hx    Colon polyps Neg Hx    Esophageal cancer Neg Hx    Rectal cancer Neg Hx    Stomach cancer Neg Hx     Social History   Socioeconomic History   Marital status: Married    Spouse name: [name]    Number of children: 2   Years of education: Nature conservation officer education level: Master's degree (e.g., MA, MS, MEng, MEd, MSW, MBA)  Occupational History   Occupation: semi retired     Comment: Education officer, environmental   Tobacco Use   Smoking status: Never   Smokeless tobacco: Never  Vaping Use   Vaping Use: Never used  Substance and Sexual Activity   Alcohol use: No   Drug use: No   Sexual activity: Yes    Birth control/protection: None  Other Topics Concern   Not on file  Social History Narrative   Lives with 2nd wife [name] -married 10 years      3 dogs: 2 inside and 1 outside: Skimp; Ginger; Eve      Enjoys: model railroad Proofreader, Tax adviser some      Diet: eats all food groups -raisins    Caffeine use: 1-2 cups decaf coffee daily, diet coke sometimes   Water: 4-5 bottles a day       Wears seat belt   Does not use phone while driving-hands free    Psychologist, sport and exercise at home   Weapons in lock box   Social Determinants of Health   Financial Resource Strain: Low Risk  (11/05/2022)   Overall Financial Resource Strain (CARDIA)    Difficulty of Paying Living Expenses: Not hard at all  Food Insecurity: No Food Insecurity (11/05/2022)   Hunger Vital Sign    Worried About Running Out of Food in  the Last Year: Never true    Ran Out of Food in the Last Year: Never true  Transportation Needs: No Transportation Needs (11/05/2022)   PRAPARE - Administrator, Civil Service (Medical): No    Lack of Transportation (Non-Medical): No  Physical Activity: Sufficiently Active (11/05/2022)   Exercise Vital Sign    Days of Exercise per Week: 5 days    Minutes of Exercise per Session: 40 min  Stress: No Stress Concern Present (11/05/2022)   [employer] of Occupational Health - Occupational Stress Questionnaire    Feeling of Stress : Only a little  Social Connections: Socially Integrated (11/05/2022)   Social Connection and Isolation Panel [NHANES]    Frequency of Communication with Friends and Family: More than three times a week    Frequency of Social Gatherings with Friends and Family: More than three times a week    Attends Religious Services: More than 4 times per year    Active Member of [employer] or Organizations: Yes    Attends Engineer, structural: More than 4 times per year    Marital Status: Married  Catering manager Violence: Not At Risk (01/10/2021)   Humiliation, Afraid, Rape, and Kick questionnaire    Fear of Current or Ex-Partner: No    Emotionally Abused: No    Physically Abused: No    Sexually Abused: No    Outpatient Medications Prior to Visit  Medication Sig Dispense Refill   amLODipine (NORVASC) 10 MG tablet Take 1 tablet (10 mg total) by mouth at bedtime. 90 tablet 0   aspirin EC 81 MG tablet Take 81 mg by mouth every evening.     atorvastatin (LIPITOR) 10 MG tablet Take 1 tablet (10 mg total) by mouth daily. 90 tablet 3   azelastine (ASTELIN) 0.1 % nasal spray Place 1 spray into both nostrils 2 (two) times daily. Use in each nostril as directed 30 mL 12   Blood Glucose Monitoring Suppl (ONE TOUCH ULTRA 2) w/Device KIT USE TO TEST BLOOD GLUCOSE ONCE DAILY AS DIRECTED 1 kit 0   Cholecalciferol (VITAMIN D3) 125 MCG (5000 UT) CAPS Take 1 capsule (5,000  Units total) by mouth daily. 90 capsule 0   Continuous Blood Gluc Receiver (FREESTYLE LIBRE 2 READER) DEVI Use to test blood glucose continuously. Dx code E11.9 1 each 0   Continuous Blood Gluc Sensor (FREESTYLE LIBRE 2 SENSOR) MISC USE AS DIRECTED TO CHECK BLOOD GLUCOSE. CHANGE EVERY 14 DAYS. 2 each 0   glucose blood (ONETOUCH ULTRA) test strip Use as instructed 100 each 2  Lancets (ONETOUCH ULTRASOFT) lancets Test at least 2 times a day 100 each 2   levocetirizine (XYZAL) 5 MG tablet Take 1 tablet (5 mg total) by mouth every evening. 90 tablet 0   levothyroxine (SYNTHROID) 125 MCG tablet TAKE (1) TABLET BY MOUTH ONCE DAILY. 90 tablet 0   metFORMIN (GLUCOPHAGE) 1000 MG tablet Take 1 tablet (1,000 mg total) by mouth 2 (two) times daily with a meal. 180 tablet 0   PARoxetine (PAXIL) 20 MG tablet Take 1 tablet (20 mg total) by mouth daily. 90 tablet 0   lisinopril (ZESTRIL) 40 MG tablet Take 1 tablet (40 mg total) by mouth daily. 90 tablet 1   No facility-administered medications prior to visit.    No Known Allergies  ROS Review of Systems  Constitutional:  Negative for chills and fever.  HENT:  Negative for congestion, postnasal drip, sinus pressure and sore throat.   Eyes:  Negative for pain and discharge.  Respiratory:  Negative for cough and shortness of breath.   Cardiovascular:  Negative for chest pain and palpitations.  Gastrointestinal:  Negative for constipation, diarrhea and vomiting.  Endocrine: Negative for polydipsia and polyuria.  Genitourinary:  Negative for dysuria and hematuria.  Musculoskeletal:  Positive for arthralgias. Negative for neck pain and neck stiffness.  Skin:  Negative for rash.  Neurological:  Negative for dizziness, weakness, numbness and headaches.  Psychiatric/Behavioral:  Negative for agitation and behavioral problems.       Objective:    Physical Exam Vitals reviewed.  Constitutional:      General: He is not in acute distress.    Appearance:  He is not diaphoretic.  HENT:     Head: Normocephalic and atraumatic.     Nose: No congestion.     Mouth/Throat:     Mouth: Mucous membranes are moist.  Eyes:     General: No scleral icterus.    Extraocular Movements: Extraocular movements intact.  Cardiovascular:     Rate and Rhythm: Normal rate and regular rhythm.     Pulses: Normal pulses.     Heart sounds: Normal heart sounds. No murmur heard. Pulmonary:     Breath sounds: Normal breath sounds. No wheezing or rales.  Musculoskeletal:     Left shoulder: No tenderness. Normal range of motion.     Cervical back: Neck supple. No tenderness.     Right lower leg: No edema.     Left lower leg: No edema.     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     Findings: No rash.  Neurological:     General: No focal deficit present.     Mental Status: He is alert and oriented to person, place, and time.     Sensory: No sensory deficit.     Motor: No weakness.  Psychiatric:        Mood and Affect: Mood normal.        Behavior: Behavior normal.     BP 135/82 (BP Location: Right Arm, Patient Position: Sitting, Cuff Size: Normal)   Pulse 67   Ht 6' (1.829 m)   Wt 188 lb (85.3 kg)   SpO2 96%   BMI 25.50 kg/m  Wt Readings from Last 3 Encounters:  11/13/22 188 lb (85.3 kg)  08/16/22 188 lb (85.3 kg)  05/09/22 191 lb 12.8 oz (87 kg)    Lab Results  Component Value Date   TSH 3.090 08/14/2022   Lab Results  Component Value Date   WBC 6.4 05/09/2022   HGB 14.4 05/09/2022   HCT 42.9 05/09/2022   MCV 88 05/09/2022   PLT 228 05/09/2022   Lab Results  Component Value Date   NA 140 08/14/2022   K 4.2 08/14/2022   CO2 26 08/14/2022   GLUCOSE 158 (H) 08/14/2022   BUN 19 08/14/2022   CREATININE 1.13 08/14/2022   BILITOT 1.0 08/14/2022   ALKPHOS 75 08/14/2022   AST 14 08/14/2022   ALT 10 08/14/2022   PROT 6.5 08/14/2022   ALBUMIN 4.5 08/14/2022   CALCIUM 9.8 08/14/2022   ANIONGAP 11  04/09/2021   EGFR 70 08/14/2022   Lab Results  Component Value Date   CHOL 116 08/14/2022   Lab Results  Component Value Date   HDL 52 08/14/2022   Lab Results  Component Value Date   LDLCALC 50 08/14/2022   Lab Results  Component Value Date   TRIG 68 08/14/2022   Lab Results  Component Value Date   CHOLHDL 2.2 08/14/2022   Lab Results  Component Value Date   HGBA1C 6.7 08/16/2022      Assessment & Plan:   Problem List Items Addressed This Visit       Cardiovascular and Mediastinum   Essential hypertension, benign - Primary    BP Readings from Last 1 Encounters:  11/13/22 135/82  Well-controlled with Lisinopril and Amlodipine Counseled for compliance with the medications Advised DASH diet and moderate exercise/walking, at least 150 mins/week      Relevant Medications   lisinopril (ZESTRIL) 20 MG tablet   CAD (coronary artery disease), native coronary artery    Continue Aspirin 81 mg QD and statin Follows up with Cardiology      Relevant Medications   lisinopril (ZESTRIL) 20 MG tablet     Endocrine   Diabetes mellitus (HCC)    Lab Results  Component Value Date   HGBA1C 6.7 08/16/2022  On Metformin Well-controlled Followed by endocrinology Advised to follow diabetic diet On ACE and statin F/u CMP and lipid panel Diabetic eye exam: Advised to follow up with Ophthalmology for diabetic eye exam      Relevant Medications   lisinopril (ZESTRIL) 20 MG tablet   Hypothyroidism    Lab Results  Component Value Date   TSH 3.090 08/14/2022  On Levothyrozine 125 mcg QD Checked TSH and free T4        Musculoskeletal and Integument   Dupuytren contracture    Flexion of the hand now affecting daily functioning Referred to hand surgery      Relevant Orders   Ambulatory referral to Hand Surgery     Other   MDD (major depressive disorder), recurrent, in partial remission (HCC)    Well-controlled with Paxil      Mixed hyperlipidemia    On  statin Check lipid profile      Relevant Medications   lisinopril (ZESTRIL) 20 MG tablet   Vitamin D deficiency    Advised to take 5000 IU daily      Acute pain of left shoulder    Left shoulder pain, likely due to OA and/or tendinitis Unlikely to be related to Dupuytren contracture Tylenol as needed for pain Avoid overhead heavy lifting      Meds ordered this encounter  Medications   lisinopril (ZESTRIL) 20 MG tablet    Sig: Take 1 tablet (20 mg total) by mouth daily.    Dispense:  90 tablet    Refill:  1  DOSE CHANGE - PLEASE CANCEL 40 MG RX.    Follow-up: No follow-ups on file.    Anabel Halon, MD

## 2022-11-13 NOTE — Patient Instructions (Addendum)
Please continue to take medications as prescribed.  Please continue to follow low carb diet and perform moderate exercise/walking at least 150 mins/week.  Please consider getting Shingrix vaccine at local pharmacy.  

## 2022-11-17 ENCOUNTER — Encounter: Payer: Self-pay | Admitting: Internal Medicine

## 2022-11-17 DIAGNOSIS — M25512 Pain in left shoulder: Secondary | ICD-10-CM | POA: Insufficient documentation

## 2022-11-17 NOTE — Assessment & Plan Note (Signed)
Flexion of the hand now affecting daily functioning Referred to hand surgery

## 2022-11-17 NOTE — Assessment & Plan Note (Signed)
Left shoulder pain, likely due to OA and/or tendinitis Unlikely to be related to Dupuytren contracture Tylenol as needed for pain Avoid overhead heavy lifting

## 2022-11-17 NOTE — Assessment & Plan Note (Signed)
Advised to take 5000 IU daily

## 2022-11-17 NOTE — Assessment & Plan Note (Signed)
Well-controlled with Paxil 

## 2022-12-04 ENCOUNTER — Other Ambulatory Visit: Payer: Self-pay | Admitting: "Endocrinology

## 2022-12-04 DIAGNOSIS — E119 Type 2 diabetes mellitus without complications: Secondary | ICD-10-CM

## 2022-12-15 ENCOUNTER — Ambulatory Visit: Payer: PPO | Admitting: "Endocrinology

## 2022-12-22 ENCOUNTER — Ambulatory Visit: Payer: PPO | Admitting: "Endocrinology

## 2022-12-26 ENCOUNTER — Other Ambulatory Visit: Payer: Self-pay

## 2022-12-26 DIAGNOSIS — E119 Type 2 diabetes mellitus without complications: Secondary | ICD-10-CM

## 2022-12-26 MED ORDER — FREESTYLE LIBRE 3 SENSOR MISC: 2 refills | Status: DC

## 2022-12-26 MED ORDER — FREESTYLE LIBRE 3 READER DEVI: 1.0000 | 0 refills | Status: AC

## 2023-01-04 ENCOUNTER — Other Ambulatory Visit: Payer: Self-pay | Admitting: Internal Medicine

## 2023-01-04 DIAGNOSIS — I10 Essential (primary) hypertension: Secondary | ICD-10-CM

## 2023-01-18 ENCOUNTER — Encounter: Payer: HMO | Admitting: Family Medicine

## 2023-01-29 ENCOUNTER — Other Ambulatory Visit: Payer: Self-pay | Admitting: Internal Medicine

## 2023-01-29 DIAGNOSIS — E039 Hypothyroidism, unspecified: Secondary | ICD-10-CM

## 2023-02-06 ENCOUNTER — Other Ambulatory Visit: Payer: Self-pay | Admitting: "Endocrinology

## 2023-02-06 ENCOUNTER — Ambulatory Visit: Payer: PPO | Admitting: "Endocrinology

## 2023-02-06 DIAGNOSIS — E119 Type 2 diabetes mellitus without complications: Secondary | ICD-10-CM

## 2023-02-13 ENCOUNTER — Other Ambulatory Visit: Payer: Self-pay | Admitting: "Endocrinology

## 2023-02-13 DIAGNOSIS — E1169 Type 2 diabetes mellitus with other specified complication: Secondary | ICD-10-CM

## 2023-02-14 ENCOUNTER — Ambulatory Visit (INDEPENDENT_AMBULATORY_CARE_PROVIDER_SITE_OTHER): Payer: PPO

## 2023-02-14 VITALS — BP 135/78 | Ht 71.0 in | Wt 190.0 lb

## 2023-02-14 DIAGNOSIS — Z0000 Encounter for general adult medical examination without abnormal findings: Secondary | ICD-10-CM | POA: Diagnosis not present

## 2023-02-14 NOTE — Patient Instructions (Signed)
Jim Salazar , Thank you for taking time to come for your Medicare Wellness Visit. I appreciate your ongoing commitment to your health goals. Please review the following plan we discussed and let me know if I can assist you in the future.   These are the goals we discussed:  Goals       Exercise 150 min/wk Moderate Activity      Glycemic Management Optimized      Evidence-based guidance:   Anticipate A1C testing (point-of-care) every 3 to 6 months based on goal attainment.  Review mutually-set A1C goal or target range.  Anticipate screening for thyroid dysfunction, adrenal dysfunction and celiac disease based on presenting signs/symptoms.  Anticipate use of insulin, multidose or continuous subcutaneous infusion with periodic adjustments; consider active involvement of pharmacist.  Explore child and caregiver fear of hypoglycemic episodes that may impact adherence to treatment plan, such as withholding insulin and allowing blood sugars to be ?oa little? high.  Provide medical nutrition therapy and development of individualized eating plan.  Compare child or caregiver reported symptoms of hypo or hyperglycemia to blood glucose levels, diet and fluid intake, current medications, psychosocial and physiologic stressors, change in activity and barriers to care adherence.  Promote self-monitoring of blood glucose levels, interval or continuous.  Assess and address barriers regarding adherence to treatment and self-management plan, including patient factors, such as family cohesion, age, developmental ability, depression, anxiety, fear of hypoglycemia or weight gain, as   well as medication cost, side effects and complicated regimen.  Encourage developmentally-appropriate balance between dependence and independence in care, especially when intensifying treatment, such as continuous blood glucose monitoring or the use of an insulin pump.  Consider referral to community-based diabetes education program,  visiting nurse, community health worker or health coach.  Initiate or review diabetes medical management plan for school that may include storage of insulin and clean, private location for insulin administration or blood glucose monitoring.    Notes:       HEMOGLOBIN A1C < 7      Patient Stated (pt-stated)      Stay active        This is a list of the screening recommended for you and due dates:  Health Maintenance  Topic Date Due   DTaP/Tdap/Td vaccine (1 - Tdap) Never done   Zoster (Shingles) Vaccine (1 of 2) Never done   COVID-19 Vaccine (2 - 2023-24 season) 03/24/2022   Hemoglobin A1C  02/14/2023   Flu Shot  02/22/2023   Yearly kidney health urinalysis for diabetes  04/05/2023   Eye exam for diabetics  04/26/2023   Complete foot exam   05/10/2023   Yearly kidney function blood test for diabetes  08/15/2023   Medicare Annual Wellness Visit  02/14/2024   Pneumonia Vaccine (3 of 3 - PPSV23 or PCV20) 06/11/2024   Colon Cancer Screening  03/21/2028   Hepatitis C Screening  Completed   HPV Vaccine  Aged Out    Advanced directives: Advance directive discussed with you today. Even though you declined this today, please call our office should you change your mind, and we can give you the proper paperwork for you to fill out. Advance care planning is a way to make decisions about medical care that fits your values in case you are ever unable to make these decisions for yourself.  Information on Advanced Care Planning can be found at [facility] [place] Advance Health Care Directives Advance Health Care Directives ([email or web address]    Conditions/risks  identified:  You are due for the vaccines checked below. You may have these done at your preferred pharmacy. Please have them fax the office proof of the vaccines so that we can update your chart.   []  Flu (due annually) [x]  Shingrix (Shingles vaccine) []  Pneumonia Vaccines [x]  TDAP (Tetanus) Vaccine every 10 years [x]   Covid-19    Next appointment: VIRTUAL/ TELEPHONE VISIT Follow up in one year for your annual wellness visit  March 26, 2024 at 8:00 am telephone visit.    Preventive Care 43 Years and Older, Male  Preventive care refers to lifestyle choices and visits with your health care provider that can promote health and wellness. What does preventive care include? A yearly physical exam. This is also called an annual well check. Dental exams once or twice a year. Routine eye exams. Ask your health care provider how often you should have your eyes checked. Personal lifestyle choices, including: Daily care of your teeth and gums. Regular physical activity. Eating a healthy diet. Avoiding tobacco and drug use. Limiting alcohol use. Practicing safe sex. Taking low doses of aspirin every day. Taking vitamin and mineral supplements as recommended by your health care provider. What happens during an annual well check? The services and screenings done by your health care provider during your annual well check will depend on your age, overall health, lifestyle risk factors, and family history of disease. Counseling  Your health care provider may ask you questions about your: Alcohol use. Tobacco use. Drug use. Emotional well-being. Home and relationship well-being. Sexual activity. Eating habits. History of falls. Memory and ability to understand (cognition). Work and work Astronomer. Screening  You may have the following tests or measurements: Height, weight, and BMI. Blood pressure. Lipid and cholesterol levels. These may be checked every 5 years, or more frequently if you are over 65 years old. Skin check. Lung cancer screening. You may have this screening every year starting at age 25 if you have a 30-pack-year history of smoking and currently smoke or have quit within the past 15 years. Fecal occult blood test (FOBT) of the stool. You may have this test every year starting at age  61. Flexible sigmoidoscopy or colonoscopy. You may have a sigmoidoscopy every 5 years or a colonoscopy every 10 years starting at age 69. Prostate cancer screening. Recommendations will vary depending on your family history and other risks. Hepatitis C blood test. Hepatitis B blood test. Sexually transmitted disease (STD) testing. Diabetes screening. This is done by checking your blood sugar (glucose) after you have not eaten for a while (fasting). You may have this done every 1-3 years. Abdominal aortic aneurysm (AAA) screening. You may need this if you are a current or former smoker. Osteoporosis. You may be screened starting at age 57 if you are at high risk. Talk with your health care provider about your test results, treatment options, and if necessary, the need for more tests. Vaccines  Your health care provider may recommend certain vaccines, such as: Influenza vaccine. This is recommended every year. Tetanus, diphtheria, and acellular pertussis (Tdap, Td) vaccine. You may need a Td booster every 10 years. Zoster vaccine. You may need this after age 57. Pneumococcal 13-valent conjugate (PCV13) vaccine. One dose is recommended after age 28. Pneumococcal polysaccharide (PPSV23) vaccine. One dose is recommended after age 83. Talk to your health care provider about which screenings and vaccines you need and how often you need them. This information is not intended to replace advice given to  you by your health care provider. Make sure you discuss any questions you have with your health care provider. Document Released: 08/06/2015 Document Revised: 03/29/2016 Document Reviewed: 05/11/2015 Elsevier Interactive Patient Education  2017 [employer].  Fall Prevention in the Home Falls can cause injuries. They can happen to people of all ages. There are many things you can do to make your home safe and to help prevent falls. What can I do on the outside of my home? Regularly fix the edges of  walkways and driveways and fix any cracks. Remove anything that might make you trip as you walk through a door, such as a raised step or threshold. Trim any bushes or trees on the path to your home. Use bright outdoor lighting. Clear any walking paths of anything that might make someone trip, such as rocks or tools. Regularly check to see if handrails are loose or broken. Make sure that both sides of any steps have handrails. Any raised decks and porches should have guardrails on the edges. Have any leaves, snow, or ice cleared regularly. Use sand or salt on walking paths during winter. Clean up any spills in your garage right away. This includes oil or grease spills. What can I do in the bathroom? Use night lights. Install grab bars by the toilet and in the tub and shower. Do not use towel bars as grab bars. Use non-skid mats or decals in the tub or shower. If you need to sit down in the shower, use a plastic, non-slip stool. Keep the floor dry. Clean up any water that spills on the floor as soon as it happens. Remove soap buildup in the tub or shower regularly. Attach bath mats securely with double-sided non-slip rug tape. Do not have throw rugs and other things on the floor that can make you trip. What can I do in the bedroom? Use night lights. Make sure that you have a light by your bed that is easy to reach. Do not use any sheets or blankets that are too big for your bed. They should not hang down onto the floor. Have a firm chair that has side arms. You can use this for support while you get dressed. Do not have throw rugs and other things on the floor that can make you trip. What can I do in the kitchen? Clean up any spills right away. Avoid walking on wet floors. Keep items that you use a lot in easy-to-reach places. If you need to reach something above you, use a strong step stool that has a grab bar. Keep electrical cords out of the way. Do not use floor polish or wax that  makes floors slippery. If you must use wax, use non-skid floor wax. Do not have throw rugs and other things on the floor that can make you trip. What can I do with my stairs? Do not leave any items on the stairs. Make sure that there are handrails on both sides of the stairs and use them. Fix handrails that are broken or loose. Make sure that handrails are as long as the stairways. Check any carpeting to make sure that it is firmly attached to the stairs. Fix any carpet that is loose or worn. Avoid having throw rugs at the top or bottom of the stairs. If you do have throw rugs, attach them to the floor with carpet tape. Make sure that you have a light switch at the top of the stairs and the bottom of the stairs.  If you do not have them, ask someone to add them for you. What else can I do to help prevent falls? Wear shoes that: Do not have high heels. Have rubber bottoms. Are comfortable and fit you well. Are closed at the toe. Do not wear sandals. If you use a stepladder: Make sure that it is fully opened. Do not climb a closed stepladder. Make sure that both sides of the stepladder are locked into place. Ask someone to hold it for you, if possible. Clearly mark and make sure that you can see: Any grab bars or handrails. First and last steps. Where the edge of each step is. Use tools that help you move around (mobility aids) if they are needed. These include: Canes. Walkers. Scooters. Crutches. Turn on the lights when you go into a dark area. Replace any light bulbs as soon as they burn out. Set up your furniture so you have a clear path. Avoid moving your furniture around. If any of your floors are uneven, fix them. If there are any pets around you, be aware of where they are. Review your medicines with your doctor. Some medicines can make you feel dizzy. This can increase your chance of falling. Ask your doctor what other things that you can do to help prevent falls. This  information is not intended to replace advice given to you by your health care provider. Make sure you discuss any questions you have with your health care provider. Document Released: 05/06/2009 Document Revised: 12/16/2015 Document Reviewed: 08/14/2014 Elsevier Interactive Patient Education  2017 [employer].

## 2023-02-14 NOTE — Progress Notes (Signed)
Because this visit was a virtual/telehealth visit,  certain criteria was not obtained, such a blood pressure, CBG if patient is a diabetic, and timed up and go. Any medications not marked as "taking" was not mentioned during the medication reconciliation part of the visit. Any vitals not documented were not able to be obtained due to this being a telehealth visit. Vitals documented are verbally provided by the patient.   Subjective:   Jim Salazar is a 72 y.o. male who presents for Medicare Annual/Subsequent preventive examination.  Visit Complete: Virtual  I connected with  Jim Salazar on 02/14/23 by a audio enabled telemedicine application and verified that I am speaking with the correct person using two identifiers.  Patient Location: Home  Provider Location: Home Office  I discussed the limitations of evaluation and management by telemedicine. The patient expressed understanding and agreed to proceed.  Patient Medicare AWV questionnaire was completed by the patient on n/a; I have confirmed that all information answered by patient is correct and no changes since this date.  Review of Systems     Cardiac Risk Factors include: advanced age (>54men, >61 women);diabetes mellitus;dyslipidemia;hypertension;male gender     Objective:    Today's Vitals   02/14/23 0757  BP: 135/78  Weight: 190 lb (86.2 kg)  Height: 5\' 11"  (1.803 m)   Body mass index is 26.5 kg/m.     02/14/2023    8:00 AM 01/16/2022    8:25 AM 04/09/2021    7:17 PM 01/10/2021    2:26 PM 06/28/2020   10:15 AM 10/07/2017    8:28 PM 09/16/2017    5:14 PM  Advanced Directives  Does Patient Have a Medical Advance Directive? No No No No No No No  Would patient like information on creating a medical advance directive? No - Patient declined Yes (ED - Information included in AVS) No - Patient declined No - Patient declined No - Patient declined  No - Patient declined    Current Medications (verified) Outpatient  Encounter Medications as of 02/14/2023  Medication Sig   amLODipine (NORVASC) 10 MG tablet Take 1 tablet (10 mg total) by mouth at bedtime.   aspirin EC 81 MG tablet Take 81 mg by mouth every evening.   atorvastatin (LIPITOR) 10 MG tablet Take 1 tablet (10 mg total) by mouth daily.   azelastine (ASTELIN) 0.1 % nasal spray Place 1 spray into both nostrils 2 (two) times daily. Use in each nostril as directed   Blood Glucose Monitoring Suppl (ONE TOUCH ULTRA 2) w/Device KIT USE TO TEST BLOOD GLUCOSE ONCE DAILY AS DIRECTED   Cholecalciferol (VITAMIN D3) 125 MCG (5000 UT) CAPS Take 1 capsule (5,000 Units total) by mouth daily.   Continuous Glucose Receiver (FREESTYLE LIBRE 3 READER) DEVI 1 each by Does not apply route as directed. Use to check glucose as directed   Continuous Glucose Sensor (FREESTYLE LIBRE 3 SENSOR) MISC USE AS DIRECTED TO CHECK GLUCOSE CONTINUOUSLY, CHANGE SENSOR EVERY 14 DAYS.   glucose blood (ONETOUCH ULTRA) test strip Use as instructed   Lancets (ONETOUCH ULTRASOFT) lancets Test at least 2 times a day   levocetirizine (XYZAL) 5 MG tablet Take 1 tablet (5 mg total) by mouth every evening.   levothyroxine (SYNTHROID) 125 MCG tablet TAKE (1) TABLET BY MOUTH ONCE DAILY.   lisinopril (ZESTRIL) 20 MG tablet Take 1 tablet (20 mg total) by mouth daily.   metFORMIN (GLUCOPHAGE) 1000 MG tablet TAKE 1 TABLET BY MOUTH TWO TIMES DAILY WITH  A MEAL   PARoxetine (PAXIL) 20 MG tablet TAKE 1 TABLET BY MOUTH DAILY.   [DISCONTINUED] JANUMET 50-1000 MG tablet Take 1 tablet by mouth 2 (two) times daily.   No facility-administered encounter medications on file as of 02/14/2023.    Allergies (verified) Patient has no known allergies.   History: Past Medical History:  Diagnosis Date   CAD (coronary artery disease), native coronary artery    mild nonobstructive plaque in LCx and RCA and mildly obstructive minimally calcified plaque in mid LAD on coronary CTA 11/2017   Depression    Diabetes  mellitus without complication (HCC)    GERD (gastroesophageal reflux disease)    Hypertension    Hypothyroidism, unspecified    Left sided numbness 09/17/2017   Major depressive disorder, single episode, unspecified    Need for immunization against influenza 06/12/2019   Need for vaccination against Streptococcus pneumoniae using pneumococcal conjugate vaccine 13 06/12/2019   Obesity, unspecified    Right upper quadrant abdominal pain 09/22/2019   Screening for prostate cancer 06/12/2019   Syncope 10/17/2017   Thyroid disease    Tremor, essential 11/30/2017   Type 2 diabetes mellitus with hyperglycemia (HCC)    Past Surgical History:  Procedure Laterality Date   APPENDECTOMY     COLONOSCOPY     TONSILLECTOMY     Family History  Problem Relation Age of Onset   Kidney disease Mother    Hypertension Mother    Thyroid disease Mother    Diabetes Mellitus II Mother    Hyperlipidemia Mother    Stroke Mother    Hypertension Father    Cancer Father        lung   Hypertension Other    Colon cancer Neg Hx    Colon polyps Neg Hx    Esophageal cancer Neg Hx    Rectal cancer Neg Hx    Stomach cancer Neg Hx    Social History   Socioeconomic History   Marital status: Married    Spouse name: [name]    Number of children: 2   Years of education: Automotive engineer   Highest education level: Master's degree (e.g., MA, MS, MEng, MEd, MSW, MBA)  Occupational History   Occupation: semi retired     Comment: Education officer, environmental   Tobacco Use   Smoking status: Never   Smokeless tobacco: Never  Vaping Use   Vaping status: Never Used  Substance and Sexual Activity   Alcohol use: No   Drug use: No   Sexual activity: Yes    Birth control/protection: None  Other Topics Concern   Not on file  Social History Narrative   Lives with 2nd wife [name] -married 10 years      3 dogs: 2 inside and 1 outside: Skimp; Ginger; Eve      Enjoys: model railroad Proofreader, Tax adviser some      Diet: eats all food groups  -raisins    Caffeine use: 1-2 cups decaf coffee daily, diet coke sometimes   Water: 4-5 bottles a day       Wears seat belt   Does not use phone while driving-hands free    Psychologist, sport and exercise at home   Weapons in lock box   Social Determinants of Health   Financial Resource Strain: Low Risk  (02/14/2023)   Overall Financial Resource Strain (CARDIA)    Difficulty of Paying Living Expenses: Not hard at all  Food Insecurity: No Food Insecurity (02/14/2023)   Hunger Vital Sign    Worried About  Running Out of Food in the Last Year: Never true    Ran Out of Food in the Last Year: Never true  Transportation Needs: No Transportation Needs (02/14/2023)   PRAPARE - Administrator, Civil Service (Medical): No    Lack of Transportation (Non-Medical): No  Physical Activity: Sufficiently Active (02/14/2023)   Exercise Vital Sign    Days of Exercise per Week: 5 days    Minutes of Exercise per Session: 30 min  Stress: No Stress Concern Present (02/14/2023)   [employer] of Occupational Health - Occupational Stress Questionnaire    Feeling of Stress : Not at all  Social Connections: Socially Integrated (02/14/2023)   Social Connection and Isolation Panel [NHANES]    Frequency of Communication with Friends and Family: More than three times a week    Frequency of Social Gatherings with Friends and Family: More than three times a week    Attends Religious Services: More than 4 times per year    Active Member of [employer] or Organizations: Yes    Attends Engineer, structural: More than 4 times per year    Marital Status: Married    Tobacco Counseling Counseling given: Yes   Clinical Intake:  Pre-visit preparation completed: Yes  Pain : No/denies pain     BMI - recorded: 26.5 Nutritional Status: BMI 25 -29 Overweight Nutritional Risks: None Diabetes: Yes CBG done?: No (teleheatlh visit. patient wears continous blood glucose monitor) Did pt. bring in CBG monitor from  home?: No  How often do you need to have someone help you when you read instructions, pamphlets, or other written materials from your doctor or pharmacy?: 1 - Never  Interpreter Needed?: No  Information entered by :: Abby Margaurite Salido, CMA   Activities of Daily Living    02/14/2023    7:59 AM  In your present state of health, do you have any difficulty performing the following activities:  Hearing? 0  Vision? 0  Difficulty concentrating or making decisions? 0  Walking or climbing stairs? 0  Dressing or bathing? 0  Doing errands, shopping? 0  Preparing Food and eating ? N  Using the Toilet? N  In the past six months, have you accidently leaked urine? N  Do you have problems with loss of bowel control? N  Managing your Medications? N  Managing your Finances? N  Housekeeping or managing your Housekeeping? N    Patient Care Team: Anabel Halon, MD as PCP - General (Internal Medicine) Quintella Reichert, MD as PCP - Cardiology (Cardiology)  Indicate any recent Medical Services you may have received from other than [facility] providers in the past year (date may be approximate).     Assessment:   This is a routine wellness examination for Josph.  Hearing/Vision screen Hearing Screening - Comments:: Patient denies any hearing difficulties.    Dietary issues and exercise activities discussed:     Goals Addressed               This Visit's Progress     Patient Stated (pt-stated)        Stay active       Depression Screen    02/14/2023    8:02 AM 11/13/2022    8:56 AM 05/09/2022    8:08 AM 01/16/2022    8:15 AM 11/01/2021    8:34 AM 09/26/2021   10:46 AM 04/26/2021    9:03 AM  PHQ 2/9 Scores  PHQ - 2 Score 0 0 0  0 0 0 0  PHQ- 9 Score     0      Fall Risk    02/14/2023    8:00 AM 11/13/2022    8:56 AM 05/09/2022    8:08 AM 01/16/2022    8:16 AM 11/01/2021    8:34 AM  Fall Risk   Falls in the past year? 0 0 0 0 0  Number falls in past yr: 0 0 0 0 0  Injury with  Fall? 0 0 0 0 0  Risk for fall due to : No Fall Risks  No Fall Risks  No Fall Risks  Follow up Falls prevention discussed  Falls evaluation completed  Falls evaluation completed    MEDICARE RISK AT HOME:  Medicare Risk at Home - 02/14/23 0801     Any stairs in or around the home? No    If so, are there any without handrails? No    Home free of loose throw rugs in walkways, pet beds, electrical cords, etc? Yes    Adequate lighting in your home to reduce risk of falls? Yes    Life alert? No    Use of a cane, walker or w/c? No    Grab bars in the bathroom? No    Shower chair or bench in shower? No    Elevated toilet seat or a handicapped toilet? No             TIMED UP AND GO:  Was the test performed?  No    Cognitive Function:        02/14/2023    8:01 AM 01/16/2022    8:28 AM 06/28/2020   10:20 AM  6CIT Screen  What Year? 0 points 0 points 0 points  What month? 0 points 0 points 0 points  What time? 0 points 0 points 0 points  Count back from 20 0 points 0 points 0 points  Months in reverse 0 points 0 points 0 points  Repeat phrase 0 points 0 points 0 points  Total Score 0 points 0 points 0 points    Immunizations Immunization History  Administered Date(s) Administered   Fluad Quad(high Dose 65+) 04/29/2020, 04/26/2021, 05/09/2022   Influenza Inj Mdck Quad Pf 05/23/2016   Influenza Inj Mdck Quad With Preservative 05/24/2017   Influenza, High Dose Seasonal PF 05/25/2018   Influenza,inj,Quad PF,6+ Mos 06/12/2019   Influenza-Unspecified 08/04/2012, 04/22/2013, 05/24/2017   Janssen (J&J) SARS-COV-2 Vaccination 10/17/2019   Pneumococcal Conjugate-13 06/12/2019   Pneumococcal Polysaccharide-23 07/20/2017    TDAP status: Due, Education has been provided regarding the importance of this vaccine. Advised may receive this vaccine at local pharmacy or Health Dept. Aware to provide a copy of the vaccination record if obtained from local pharmacy or Health Dept. Verbalized  acceptance and understanding.  Flu Vaccine status: Up to date  Pneumococcal vaccine status: Up to date  Covid-19 vaccine status: Information provided on how to obtain vaccines.   Qualifies for Shingles Vaccine? Yes   Zostavax completed No   Shingrix Completed?: No.    Education has been provided regarding the importance of this vaccine. Patient has been advised to call insurance company to determine out of pocket expense if they have not yet received this vaccine. Advised may also receive vaccine at local pharmacy or Health Dept. Verbalized acceptance and understanding.  Screening Tests Health Maintenance  Topic Date Due   DTaP/Tdap/Td (1 - Tdap) Never done   Zoster Vaccines- Shingrix (1 of 2) Never done  COVID-19 Vaccine (2 - 2023-24 season) 03/24/2022   Medicare Annual Wellness (AWV)  01/17/2023   HEMOGLOBIN A1C  02/14/2023   INFLUENZA VACCINE  02/22/2023   Diabetic kidney evaluation - Urine ACR  04/05/2023   OPHTHALMOLOGY EXAM  04/26/2023   FOOT EXAM  05/10/2023   Diabetic kidney evaluation - eGFR measurement  08/15/2023   Pneumonia Vaccine 72+ Years old (3 of 3 - PPSV23 or PCV20) 06/11/2024   Colonoscopy  03/21/2028   Hepatitis C Screening  Completed   HPV VACCINES  Aged Out    Health Maintenance  Health Maintenance Due  Topic Date Due   DTaP/Tdap/Td (1 - Tdap) Never done   Zoster Vaccines- Shingrix (1 of 2) Never done   COVID-19 Vaccine (2 - 2023-24 season) 03/24/2022   Medicare Annual Wellness (AWV)  01/17/2023   HEMOGLOBIN A1C  02/14/2023    Colorectal cancer screening: Type of screening: Colonoscopy. Completed 03/21/2021. Repeat every 7 years  Lung Cancer Screening: (Low Dose CT Chest recommended if Age 50-80 years, 20 pack-year currently smoking OR have quit w/in 15years.) does not qualify.   Additional Screening:  Hepatitis C Screening: does not qualify; Completed 04/26/2021  Vision Screening: Recommended annual ophthalmology exams for early detection of  glaucoma and other disorders of the eye. Is the patient up to date with their annual eye exam?  Yes  Who is the provider or what is the name of the office in which the patient attends annual eye exams? Dr. Charise Killian, [facility] If pt is not established with a provider, would they like to be referred to a provider to establish care? No .   Dental Screening: Recommended annual dental exams for proper oral hygiene  Diabetic Foot Exam: Diabetic Foot Exam: Completed 05/09/2022  Community Resource Referral / Chronic Care Management: CRR required this visit?  No   CCM required this visit?  No     Plan:     I have personally reviewed and noted the following in the patient's chart:   Medical and social history Use of alcohol, tobacco or illicit drugs  Current medications and supplements including opioid prescriptions. Patient is not currently taking opioid prescriptions. Functional ability and status Nutritional status Physical activity Advanced directives List of other physicians Hospitalizations, surgeries, and ER visits in previous 12 months Vitals Screenings to include cognitive, depression, and falls Referrals and appointments  In addition, I have reviewed and discussed with patient certain preventive protocols, quality metrics, and best practice recommendations. A written personalized care plan for preventive services as well as general preventive health recommendations were provided to patient.     Jordan Hawks Zan Orlick, CMA   02/14/2023   After Visit Summary: (MyChart) Due to this being a telephonic visit, the after visit summary with patients personalized plan was offered to patient via MyChart   Nurse Notes:

## 2023-02-21 ENCOUNTER — Ambulatory Visit (INDEPENDENT_AMBULATORY_CARE_PROVIDER_SITE_OTHER): Payer: PPO | Admitting: "Endocrinology

## 2023-02-21 ENCOUNTER — Encounter: Payer: Self-pay | Admitting: "Endocrinology

## 2023-02-21 VITALS — BP 146/72 | HR 60 | Ht 72.0 in | Wt 192.4 lb

## 2023-02-21 DIAGNOSIS — E559 Vitamin D deficiency, unspecified: Secondary | ICD-10-CM | POA: Diagnosis not present

## 2023-02-21 DIAGNOSIS — E119 Type 2 diabetes mellitus without complications: Secondary | ICD-10-CM

## 2023-02-21 DIAGNOSIS — I10 Essential (primary) hypertension: Secondary | ICD-10-CM

## 2023-02-21 DIAGNOSIS — E039 Hypothyroidism, unspecified: Secondary | ICD-10-CM | POA: Diagnosis not present

## 2023-02-21 DIAGNOSIS — Z7984 Long term (current) use of oral hypoglycemic drugs: Secondary | ICD-10-CM | POA: Diagnosis not present

## 2023-02-21 DIAGNOSIS — E782 Mixed hyperlipidemia: Secondary | ICD-10-CM | POA: Diagnosis not present

## 2023-02-21 LAB — POCT GLYCOSYLATED HEMOGLOBIN (HGB A1C): HbA1c, POC (controlled diabetic range): 6.5 % (ref 0.0–7.0)

## 2023-02-21 MED ORDER — AMLODIPINE BESYLATE 10 MG PO TABS
10.0000 mg | ORAL_TABLET | Freq: Every day | ORAL | 1 refills | Status: DC
Start: 2023-02-21 — End: 2023-04-27

## 2023-02-21 MED ORDER — LISINOPRIL 20 MG PO TABS
20.0000 mg | ORAL_TABLET | Freq: Every day | ORAL | 1 refills | Status: DC
Start: 2023-02-21 — End: 2023-05-15

## 2023-02-21 NOTE — Progress Notes (Signed)
02/21/2023, 4:41 PM  Endocrinology follow-up note   Subjective:    Patient ID: Jim Salazar, male    DOB: 1951/06/15.  Jim Salazar is being seen in follow-up after he was seen in consultation for  management of currently uncontrolled symptomatic diabetes requested by  Anabel Halon, MD.   Past Medical History:  Diagnosis Date   CAD (coronary artery disease), native coronary artery    mild nonobstructive plaque in LCx and RCA and mildly obstructive minimally calcified plaque in mid LAD on coronary CTA 11/2017   Depression    Diabetes mellitus without complication (HCC)    GERD (gastroesophageal reflux disease)    Hypertension    Hypothyroidism, unspecified    Left sided numbness 09/17/2017   Major depressive disorder, single episode, unspecified    Need for immunization against influenza 06/12/2019   Need for vaccination against Streptococcus pneumoniae using pneumococcal conjugate vaccine 13 06/12/2019   Obesity, unspecified    Right upper quadrant abdominal pain 09/22/2019   Screening for prostate cancer 06/12/2019   Syncope 10/17/2017   Thyroid disease    Tremor, essential 11/30/2017   Type 2 diabetes mellitus with hyperglycemia (HCC)     Past Surgical History:  Procedure Laterality Date   APPENDECTOMY     COLONOSCOPY     TONSILLECTOMY      Social History   Socioeconomic History   Marital status: Married    Spouse name: [name]    Number of children: 2   Years of education: Automotive engineer   Highest education level: Master's degree (e.g., MA, MS, MEng, MEd, MSW, MBA)  Occupational History   Occupation: semi retired     Comment: Education officer, environmental   Tobacco Use   Smoking status: Never   Smokeless tobacco: Never  Vaping Use   Vaping status: Never Used  Substance and Sexual Activity   Alcohol use: No   Drug use: No   Sexual activity: Yes    Birth control/protection: None  Other Topics Concern   Not on file  Social History Narrative   Lives with  2nd wife [name] -married 10 years      3 dogs: 2 inside and 1 outside: Skimp; Ginger; Eve      Enjoys: model railroad Proofreader, Tax adviser some      Diet: eats all food groups -raisins    Caffeine use: 1-2 cups decaf coffee daily, diet coke sometimes   Water: 4-5 bottles a day       Wears seat belt   Does not use phone while driving-hands free    Psychologist, sport and exercise at home   Weapons in lock box   Social Determinants of Health   Financial Resource Strain: Low Risk  (02/14/2023)   Overall Financial Resource Strain (CARDIA)    Difficulty of Paying Living Expenses: Not hard at all  Food Insecurity: No Food Insecurity (02/14/2023)   Hunger Vital Sign    Worried About Running Out of Food in the Last Year: Never true    Ran Out of Food in the Last Year: Never true  Transportation Needs: No Transportation Needs (02/14/2023)   PRAPARE - Administrator, Civil Service (Medical): No    Lack of Transportation (Non-Medical): No  Physical Activity: Sufficiently Active (02/14/2023)   Exercise Vital Sign    Days of Exercise per Week: 5 days    Minutes of Exercise per Session: 30 min  Stress: No Stress Concern Present (02/14/2023)   [employer] of  Occupational Health - Occupational Stress Questionnaire    Feeling of Stress : Not at all  Social Connections: Socially Integrated (02/14/2023)   Social Connection and Isolation Panel [NHANES]    Frequency of Communication with Friends and Family: More than three times a week    Frequency of Social Gatherings with Friends and Family: More than three times a week    Attends Religious Services: More than 4 times per year    Active Member of [employer] or Organizations: Yes    Attends Engineer, structural: More than 4 times per year    Marital Status: Married    Family History  Problem Relation Age of Onset   Kidney disease Mother    Hypertension Mother    Thyroid disease Mother    Diabetes Mellitus II Mother    Hyperlipidemia Mother     Stroke Mother    Hypertension Father    Cancer Father        lung   Hypertension Other    Colon cancer Neg Hx    Colon polyps Neg Hx    Esophageal cancer Neg Hx    Rectal cancer Neg Hx    Stomach cancer Neg Hx     Outpatient Encounter Medications as of 02/21/2023  Medication Sig   amLODipine (NORVASC) 10 MG tablet Take 1 tablet (10 mg total) by mouth daily with breakfast.   aspirin EC 81 MG tablet Take 81 mg by mouth every evening.   atorvastatin (LIPITOR) 10 MG tablet Take 1 tablet (10 mg total) by mouth daily.   azelastine (ASTELIN) 0.1 % nasal spray Place 1 spray into both nostrils 2 (two) times daily. Use in each nostril as directed   Blood Glucose Monitoring Suppl (ONE TOUCH ULTRA 2) w/Device KIT USE TO TEST BLOOD GLUCOSE ONCE DAILY AS DIRECTED   Cholecalciferol (VITAMIN D3) 125 MCG (5000 UT) CAPS Take 1 capsule (5,000 Units total) by mouth daily.   Continuous Glucose Receiver (FREESTYLE LIBRE 3 READER) DEVI 1 each by Does not apply route as directed. Use to check glucose as directed   Continuous Glucose Sensor (FREESTYLE LIBRE 3 SENSOR) MISC USE AS DIRECTED TO CHECK GLUCOSE CONTINUOUSLY, CHANGE SENSOR EVERY 14 DAYS.   glucose blood (ONETOUCH ULTRA) test strip Use as instructed   Lancets (ONETOUCH ULTRASOFT) lancets Test at least 2 times a day   levocetirizine (XYZAL) 5 MG tablet Take 1 tablet (5 mg total) by mouth every evening.   levothyroxine (SYNTHROID) 125 MCG tablet TAKE (1) TABLET BY MOUTH ONCE DAILY.   lisinopril (ZESTRIL) 20 MG tablet Take 1 tablet (20 mg total) by mouth daily with breakfast.   metFORMIN (GLUCOPHAGE) 1000 MG tablet TAKE 1 TABLET BY MOUTH TWO TIMES DAILY WITH A MEAL   PARoxetine (PAXIL) 20 MG tablet TAKE 1 TABLET BY MOUTH DAILY.   [DISCONTINUED] amLODipine (NORVASC) 10 MG tablet Take 1 tablet (10 mg total) by mouth at bedtime.   [DISCONTINUED] JANUMET 50-1000 MG tablet Take 1 tablet by mouth 2 (two) times daily.   [DISCONTINUED] lisinopril (ZESTRIL) 20 MG  tablet Take 1 tablet (20 mg total) by mouth daily.   No facility-administered encounter medications on file as of 02/21/2023.    ALLERGIES: No Known Allergies  VACCINATION STATUS: Immunization History  Administered Date(s) Administered   Fluad Quad(high Dose 65+) 04/29/2020, 04/26/2021, 05/09/2022   Influenza Inj Mdck Quad Pf 05/23/2016   Influenza Inj Mdck Quad With Preservative 05/24/2017   Influenza, High Dose Seasonal PF 05/25/2018   Influenza,inj,Quad PF,6+  Mos 06/12/2019   Influenza-Unspecified 08/04/2012, 04/22/2013, 05/24/2017   Janssen (J&J) SARS-COV-2 Vaccination 10/17/2019   Pneumococcal Conjugate-13 06/12/2019   Pneumococcal Polysaccharide-23 07/20/2017    Diabetes He presents for his follow-up diabetic visit. He has type 2 diabetes mellitus. Onset time: He was diagnosed at approximate age of 89 years. His disease course has been stable. There are no hypoglycemic associated symptoms. Pertinent negatives for hypoglycemia include no confusion, headaches, pallor or seizures. Pertinent negatives for diabetes include no chest pain, no fatigue, no polydipsia, no polyphagia, no polyuria and no weakness. There are no hypoglycemic complications. Symptoms are stable. There are no diabetic complications. Risk factors for coronary artery disease include diabetes mellitus, dyslipidemia, family history, hypertension, sedentary lifestyle and male sex. Current diabetic treatment includes oral agent (monotherapy). His weight is fluctuating minimally. When asked about meal planning, he reported none. He has had a previous visit with a dietitian. He participates in exercise intermittently. His home blood glucose trend is decreasing steadily. His breakfast blood glucose range is generally 130-140 mg/dl. His Salazar blood glucose range is generally 130-140 mg/dl. His dinner blood glucose range is generally 130-140 mg/dl. His bedtime blood glucose range is generally 130-140 mg/dl. His overall blood glucose  range is 130-140 mg/dl. (Jim Salazar wears a CGM device at this time.  He presents with continued improvement in his glycemic profile.  His AGP report shows 86% time in range, 14% level 1 hyperglycemia.  Has no hypoglycemia.  His point-of-care A1c is 6.5%, overall improving.    She is engaged in lifestyle medicine, maintaining the weight loss he has achieved prior to his last visit.  ) An ACE inhibitor/angiotensin II receptor blocker is being taken. Eye exam is current.  Hyperlipidemia This is a chronic problem. The current episode started more than 1 year ago. Exacerbating diseases include diabetes. Pertinent negatives include no chest pain, myalgias or shortness of breath. Current antihyperlipidemic treatment includes statins and diet change. Risk factors for coronary artery disease include dyslipidemia, diabetes mellitus, male sex and family history.  Hypertension This is a chronic problem. The current episode started more than 1 year ago. Pertinent negatives include no chest pain, headaches, neck pain, palpitations or shortness of breath. Risk factors for coronary artery disease include dyslipidemia, diabetes mellitus, family history, male gender and sedentary lifestyle. Past treatments include ACE inhibitors.     Review of Systems  Constitutional:  Negative for chills, fatigue, fever and unexpected weight change.  HENT:  Negative for dental problem, mouth sores and trouble swallowing.   Eyes:  Negative for visual disturbance.  Respiratory:  Negative for cough, choking, chest tightness, shortness of breath and wheezing.   Cardiovascular:  Negative for chest pain, palpitations and leg swelling.  Gastrointestinal:  Negative for abdominal distention, abdominal pain, constipation, diarrhea, nausea and vomiting.  Endocrine: Negative for polydipsia, polyphagia and polyuria.  Genitourinary:  Negative for dysuria, flank pain, hematuria and urgency.  Musculoskeletal:  Negative for back pain, gait problem,  myalgias and neck pain.  Skin:  Negative for pallor, rash and wound.  Neurological:  Negative for seizures, syncope, weakness, numbness and headaches.  Psychiatric/Behavioral:  Negative for confusion and dysphoric mood.     Objective:       02/21/2023   11:24 AM 02/21/2023   11:14 AM 02/14/2023    7:57 AM  Vitals with BMI  Height  6\' 0"  5\' 11"   Weight  192 lbs 6 oz 190 lbs  BMI  26.09 26.51  Systolic 146 150 914  Diastolic 72  66 78  Pulse  60     BP (!) 146/72 Comment: L arm with manuel cuff. Pt states he has not taken BP medication  Pulse 60   Ht 6' (1.829 m)   Wt 192 lb 6.4 oz (87.3 kg)   BMI 26.09 kg/m   Wt Readings from Last 3 Encounters:  02/21/23 192 lb 6.4 oz (87.3 kg)  02/14/23 190 lb (86.2 kg)  11/13/22 188 lb (85.3 kg)       CMP ( most recent) CMP     Component Value Date/Time   NA 140 08/14/2022 0854   K 4.2 08/14/2022 0854   CL 101 08/14/2022 0854   CO2 26 08/14/2022 0854   GLUCOSE 158 (H) 08/14/2022 0854   GLUCOSE 302 (H) 04/09/2021 2059   BUN 19 08/14/2022 0854   CREATININE 1.13 08/14/2022 0854   CREATININE 1.02 06/22/2020 0847   CALCIUM 9.8 08/14/2022 0854   PROT 6.5 08/14/2022 0854   ALBUMIN 4.5 08/14/2022 0854   AST 14 08/14/2022 0854   ALT 10 08/14/2022 0854   ALKPHOS 75 08/14/2022 0854   BILITOT 1.0 08/14/2022 0854   GFRNONAA >60 04/09/2021 2059   GFRNONAA 76 06/22/2020 0847   GFRAA 88 06/22/2020 0847     Diabetic Labs (most recent): Lab Results  Component Value Date   HGBA1C 6.5 02/21/2023   HGBA1C 6.7 08/16/2022   HGBA1C 8.0 (A) 04/04/2022   MICROALBUR 10 04/04/2022     Lipid Panel ( most recent) Lipid Panel     Component Value Date/Time   CHOL 116 08/14/2022 0854   TRIG 68 08/14/2022 0854   HDL 52 08/14/2022 0854   CHOLHDL 2.2 08/14/2022 0854   CHOLHDL 3.0 09/23/2019 0746   VLDL 25 09/17/2017 0843   LDLCALC 50 08/14/2022 0854   LDLCALC 47 09/23/2019 0746   LABVLDL 14 08/14/2022 0854      Lab Results  Component  Value Date   TSH 3.090 08/14/2022   TSH 0.650 12/22/2021   TSH 4.980 (H) 04/26/2021   TSH 0.322 (L) 04/09/2021   TSH 1.240 09/22/2020   TSH 7.26 (H) 09/23/2019   TSH 2.643 09/17/2017   FREET4 1.41 08/14/2022   FREET4 1.74 12/22/2021   FREET4 1.39 04/26/2021   FREET4 1.40 09/22/2020      Assessment & Plan:   1. Poorly controlled type 2 diabetes mellitus (HCC)  - Jim Salazar has currently uncontrolled symptomatic type 2 DM since  72 years of age.  Jim Salazar wears a CGM device at this time.  He presents with continued improvement in his glycemic profile.  His AGP report shows 86% time in range, 14% level 1 hyperglycemia.  Has no hypoglycemia.  His point-of-care A1c is 6.5%, overall improving.    She is engaged in lifestyle medicine, maintaining the weight loss he has achieved prior to his last visit.    - I had a long discussion with him about the progressive nature of diabetes and the pathology behind its complications. -He does not report gross complications from diabetes, however, he remains at a high risk for more acute and chronic complications which include CAD, CVA, CKD, retinopathy, and neuropathy. These are all discussed in detail with him.  - I have counseled him on diet  and weight management  by adopting a carbohydrate restricted/protein rich diet. Patient is encouraged to switch to  unprocessed or minimally processed     complex starch and increased protein intake (animal or plant source), fruits, and vegetables. -  he is advised to stick to a routine mealtimes to eat 3 meals  a day and avoid unnecessary snacks ( to snack only to correct hypoglycemia).  In light of his metabolic dysfunction with multiple chronic conditions , he remains a good candidate for lifestyle medicine. -He is encouraged to stay engaged in lifestyle medicine.  - he acknowledges that there is a room for improvement in his food and drink choices. - Suggestion is made for him to avoid simple  carbohydrates  from his diet including Cakes, Sweet Desserts, Ice Cream, Soda (diet and regular), Sweet Tea, Candies, Chips, Cookies, Store Bought Juices, Alcohol , Artificial Sweeteners,  Coffee Creamer, and "Sugar-free" Products, Lemonade. This will help patient to have more stable blood glucose profile and potentially avoid unintended weight gain.  The following Lifestyle Medicine recommendations according to American College of Lifestyle Medicine  [facility]) were discussed and and offered to patient and he  agrees to start the journey:  A. Whole Foods, Plant-Based Nutrition comprising of fruits and vegetables, plant-based proteins, whole-grain carbohydrates was discussed in detail with the patient.   A list for source of those nutrients were also provided to the patient.  Patient will use only water or unsweetened tea for hydration. B.  The need to stay away from risky substances including alcohol, smoking; obtaining 7 to 9 hours of restorative sleep, at least 150 minutes of moderate intensity exercise weekly, the importance of healthy social connections,  and stress management techniques were discussed. C.  A full color page of  Calorie density of various food groups per pound showing examples of each food groups was provided to the patient.    - I have approached him with the following individualized plan to manage  his diabetes and patient agrees:   -In light of his presentation with controlled glycemic profile, he will not need insulin nor GLP-1 receptor  agonists which he did not tolerate.   -He is advised to continue metformin 1000 mg p.o. daily.  Will continue to benefit from his CGM device.   He is advised to call clinic for blood glucose readings greater than 200 mg per DL at fasting.  - Specific targets for  A1c;  LDL, HDL,  and Triglycerides were discussed with the patient.  2) Blood Pressure /Hypertension: His blood pressure is not controlled to target.  The recommended lifestyle  nutrition will help him control hypertension as well.   He is currently on lisinopril 20 mg p.o. daily breakfast and amlodipine 10 mg p.o. daily at supper.  He is advised to take both of his medications at breakfast since he is not consistent on the amlodipine in the afternoon.    3) Lipids/Hyperlipidemia:   Review of his recent lipid panel showed  controlled  LDL at 50 .  he  is advised to continue atorvastatin 10 mg p.o. daily at bedtime.  Side effects and precautions discussed with him.  4)  Weight/Diet:  Body mass index is 26.09 kg/m.  -      he is not a candidate for major weight loss.   I discussed with him the fact that loss of 5 - 10% of his  current body weight will have the most impact on his diabetes management.  Exercise, and detailed carbohydrates information provided  -  detailed on discharge instructions.  5) hypothyroidism-the circumstance of his diagnosis are not available to review. He is advised to continue Synthroid 125 mcg p.o. daily before breakfast.     -  We discussed about the correct intake of his thyroid hormone, on empty stomach at fasting, with water, separated by at least 30 minutes from breakfast and other medications,  and separated by more than 4 hours from calcium, iron, multivitamins, acid reflux medications (PPIs). -Patient is made aware of the fact that thyroid hormone replacement is needed for life, dose to be adjusted by periodic monitoring of thyroid function tests.   6) Chronic Care/Health Maintenance:  -he  is on ACEI/ARB and Statin medications and  is encouraged to initiate and continue to follow up with Ophthalmology, Dentist,  Podiatrist at least yearly or according to recommendations, and advised to   stay away from smoking. I have recommended yearly flu vaccine and pneumonia vaccine at least every 5 years; moderate intensity exercise for up to 150 minutes weekly; and  sleep for at least 7 hours a day.  - he is  advised to maintain close follow up with  Anabel Halon, MD for primary care needs, as well as his other providers for optimal and coordinated care.  I spent  25  minutes in the care of the patient today including review of labs from CMP, Lipids, Thyroid Function, Hematology (current and previous including abstractions from other facilities); face-to-face time discussing  his blood glucose readings/logs, discussing hypoglycemia and hyperglycemia episodes and symptoms, medications doses, his options of short and long term treatment based on the latest standards of care / guidelines;  discussion about incorporating lifestyle medicine;  and documenting the encounter. Risk reduction counseling performed per USPSTF guidelines to reduce cardiovascular risk factors.     Please refer to Patient Instructions for Blood Glucose Monitoring and Insulin/Medications Dosing Guide"  in media tab for additional information. Please  also refer to " Patient Self Inventory" in the Media  tab for reviewed elements of pertinent patient history.  Jim Salazar participated in the discussions, expressed understanding, and voiced agreement with the above plans.  All questions were answered to his satisfaction. he is encouraged to contact clinic should he have any questions or concerns prior to his return visit.   Follow up plan: - Return in about 6 months (around 08/24/2023) for F/U with Pre-visit Labs, Meter/CGM/Logs, A1c here.  Jim Lunch, MD [facility] [address] [place], [place] [address] Phone: [phone]  Fax: [phone]    02/21/2023, 4:41 PM  This note was partially dictated with voice recognition software. Similar sounding words can be transcribed inadequately or may not  be corrected upon review.

## 2023-02-21 NOTE — Patient Instructions (Signed)

## 2023-03-02 ENCOUNTER — Other Ambulatory Visit: Payer: Self-pay | Admitting: Cardiology

## 2023-03-02 DIAGNOSIS — M720 Palmar fascial fibromatosis [Dupuytren]: Secondary | ICD-10-CM | POA: Diagnosis not present

## 2023-03-02 DIAGNOSIS — M79641 Pain in right hand: Secondary | ICD-10-CM | POA: Diagnosis not present

## 2023-03-02 DIAGNOSIS — E782 Mixed hyperlipidemia: Secondary | ICD-10-CM

## 2023-03-06 ENCOUNTER — Other Ambulatory Visit: Payer: Self-pay | Admitting: Internal Medicine

## 2023-03-06 DIAGNOSIS — E782 Mixed hyperlipidemia: Secondary | ICD-10-CM

## 2023-03-20 ENCOUNTER — Other Ambulatory Visit: Payer: Self-pay | Admitting: Orthopedic Surgery

## 2023-03-20 DIAGNOSIS — M720 Palmar fascial fibromatosis [Dupuytren]: Secondary | ICD-10-CM | POA: Diagnosis not present

## 2023-03-20 DIAGNOSIS — G8918 Other acute postprocedural pain: Secondary | ICD-10-CM | POA: Diagnosis not present

## 2023-03-28 DIAGNOSIS — M720 Palmar fascial fibromatosis [Dupuytren]: Secondary | ICD-10-CM | POA: Diagnosis not present

## 2023-04-05 DIAGNOSIS — M720 Palmar fascial fibromatosis [Dupuytren]: Secondary | ICD-10-CM | POA: Diagnosis not present

## 2023-04-13 DIAGNOSIS — M720 Palmar fascial fibromatosis [Dupuytren]: Secondary | ICD-10-CM | POA: Diagnosis not present

## 2023-04-20 ENCOUNTER — Other Ambulatory Visit: Payer: Self-pay | Admitting: Internal Medicine

## 2023-04-20 ENCOUNTER — Ambulatory Visit (INDEPENDENT_AMBULATORY_CARE_PROVIDER_SITE_OTHER): Payer: PPO | Admitting: Internal Medicine

## 2023-04-20 ENCOUNTER — Telehealth: Payer: Self-pay | Admitting: Internal Medicine

## 2023-04-20 ENCOUNTER — Encounter: Payer: Self-pay | Admitting: Internal Medicine

## 2023-04-20 VITALS — BP 146/86 | HR 74 | Ht 72.0 in | Wt 198.4 lb

## 2023-04-20 DIAGNOSIS — J069 Acute upper respiratory infection, unspecified: Secondary | ICD-10-CM | POA: Insufficient documentation

## 2023-04-20 DIAGNOSIS — J309 Allergic rhinitis, unspecified: Secondary | ICD-10-CM

## 2023-04-20 MED ORDER — PROMETHAZINE-DM 6.25-15 MG/5ML PO SYRP: 5.0000 mL | ORAL_SOLUTION | Freq: Four times a day (QID) | ORAL | 0 refills | Status: DC | PRN

## 2023-04-20 MED ORDER — AZITHROMYCIN 250 MG PO TABS: ORAL_TABLET | ORAL | 0 refills | Status: AC

## 2023-04-20 NOTE — Telephone Encounter (Signed)
Refills were sent to pharmacy earlier today

## 2023-04-20 NOTE — Patient Instructions (Signed)
Please perform home COVID test today and contact with the result.  Please take Tylenol as needed for fever and muscle aches.  Please take Promethazine-DM syrup as needed for cough.

## 2023-04-20 NOTE — Telephone Encounter (Signed)
Patient called took home covid test was negative

## 2023-04-20 NOTE — Addendum Note (Signed)
Addended byTrena Platt on: 04/20/2023 11:39 AM   Modules accepted: Orders

## 2023-04-20 NOTE — Assessment & Plan Note (Signed)
Currently on Xyzal Has azelastine nasal spray

## 2023-04-20 NOTE — Assessment & Plan Note (Signed)
Likely viral URTI Check rapid flu and COVID RT-PCR Tylenol as needed for myalgias Promethazine DM syrup as needed for cough If viral testing negative with persistent symptoms, will start antibiotic

## 2023-04-20 NOTE — Telephone Encounter (Signed)
Patient calling needing a refill on PARoxetine (PAXIL) 20 MG tablet [[id number]] - says he tried to do it through MyChart and it was denied. Please advise Thank you

## 2023-04-20 NOTE — Progress Notes (Signed)
Acute Office Visit  Subjective:    Patient ID: Jim Salazar, male    DOB: 1951-01-12, 72 y.o.   MRN: [id number]  Chief Complaint  Patient presents with   Headache    Patient has had a constant headache since Monday , blood pressure been running high , lack of energy , tired     HPI Patient is in today for complaint of nasal congestion, sinus pressure related headache and fatigue for the last 4 days.  He has dry cough as well. He denies any fever or chills. Denies any dyspnea or wheezing currently. He was recently at a funeral, where few people were found to be COVID positive later.  He has tried taking Tylenol for myalgias.  He currently takes Xyzal for allergic sinusitis.  His BP has been elevated in this week.  He denies any chest pain or palpitations currently.  He takes lisinopril 20 mg QD and amlodipine 10 mg QD currently.  Past Medical History:  Diagnosis Date   CAD (coronary artery disease), native coronary artery    mild nonobstructive plaque in LCx and RCA and mildly obstructive minimally calcified plaque in mid LAD on coronary CTA 11/2017   Depression    Diabetes mellitus without complication (HCC)    GERD (gastroesophageal reflux disease)    Hypertension    Hypothyroidism, unspecified    Left sided numbness 09/17/2017   Major depressive disorder, single episode, unspecified    Need for immunization against influenza 06/12/2019   Need for vaccination against Streptococcus pneumoniae using pneumococcal conjugate vaccine 13 06/12/2019   Obesity, unspecified    Right upper quadrant abdominal pain 09/22/2019   Screening for prostate cancer 06/12/2019   Syncope 10/17/2017   Thyroid disease    Tremor, essential 11/30/2017   Type 2 diabetes mellitus with hyperglycemia (HCC)     Past Surgical History:  Procedure Laterality Date   APPENDECTOMY     COLONOSCOPY     TONSILLECTOMY      Family History  Problem Relation Age of Onset   Kidney disease Mother     Hypertension Mother    Thyroid disease Mother    Diabetes Mellitus II Mother    Hyperlipidemia Mother    Stroke Mother    Hypertension Father    Cancer Father        lung   Hypertension Other    Colon cancer Neg Hx    Colon polyps Neg Hx    Esophageal cancer Neg Hx    Rectal cancer Neg Hx    Stomach cancer Neg Hx     Social History   Socioeconomic History   Marital status: Married    Spouse name: [name]    Number of children: 2   Years of education: Automotive engineer   Highest education level: Master's degree (e.g., MA, MS, MEng, MEd, MSW, MBA)  Occupational History   Occupation: semi retired     Comment: Education officer, environmental   Tobacco Use   Smoking status: Never   Smokeless tobacco: Never  Vaping Use   Vaping status: Never Used  Substance and Sexual Activity   Alcohol use: No   Drug use: No   Sexual activity: Yes    Birth control/protection: None  Other Topics Concern   Not on file  Social History Narrative   Lives with 2nd wife [name] -married 10 years      3 dogs: 2 inside and 1 outside: Skimp; Ginger; Eve      Enjoys: model railroad Proofreader, Tax adviser  some      Diet: eats all food groups -raisins    Caffeine use: 1-2 cups decaf coffee daily, diet coke sometimes   Water: 4-5 bottles a day       Wears seat belt   Does not use phone while driving-hands free    Smoke detectors at home   Weapons in lock box   Social Determinants of Health   Financial Resource Strain: Low Risk  (02/14/2023)   Overall Financial Resource Strain (CARDIA)    Difficulty of Paying Living Expenses: Not hard at all  Food Insecurity: No Food Insecurity (02/14/2023)   Hunger Vital Sign    Worried About Running Out of Food in the Last Year: Never true    Ran Out of Food in the Last Year: Never true  Transportation Needs: No Transportation Needs (02/14/2023)   PRAPARE - Administrator, Civil Service (Medical): No    Lack of Transportation (Non-Medical): No  Physical Activity: Sufficiently Active  (02/14/2023)   Exercise Vital Sign    Days of Exercise per Week: 5 days    Minutes of Exercise per Session: 30 min  Stress: No Stress Concern Present (02/14/2023)   [employer] of Occupational Health - Occupational Stress Questionnaire    Feeling of Stress : Not at all  Social Connections: Socially Integrated (02/14/2023)   Social Connection and Isolation Panel [NHANES]    Frequency of Communication with Friends and Family: More than three times a week    Frequency of Social Gatherings with Friends and Family: More than three times a week    Attends Religious Services: More than 4 times per year    Active Member of [employer] or Organizations: Yes    Attends Engineer, structural: More than 4 times per year    Marital Status: Married  Catering manager Violence: Not At Risk (02/14/2023)   Humiliation, Afraid, Rape, and Kick questionnaire    Fear of Current or Ex-Partner: No    Emotionally Abused: No    Physically Abused: No    Sexually Abused: No    Outpatient Medications Prior to Visit  Medication Sig Dispense Refill   amLODipine (NORVASC) 10 MG tablet Take 1 tablet (10 mg total) by mouth daily with breakfast. 90 tablet 1   aspirin EC 81 MG tablet Take 81 mg by mouth every evening.     atorvastatin (LIPITOR) 10 MG tablet TAKE (1) TABLET BY MOUTH ONCE DAILY. 90 tablet 0   azelastine (ASTELIN) 0.1 % nasal spray Place 1 spray into both nostrils 2 (two) times daily. Use in each nostril as directed 30 mL 12   Blood Glucose Monitoring Suppl (ONE TOUCH ULTRA 2) w/Device KIT USE TO TEST BLOOD GLUCOSE ONCE DAILY AS DIRECTED 1 kit 0   Cholecalciferol (VITAMIN D3) 125 MCG (5000 UT) CAPS Take 1 capsule (5,000 Units total) by mouth daily. 90 capsule 0   Continuous Glucose Receiver (FREESTYLE LIBRE 3 READER) DEVI 1 each by Does not apply route as directed. Use to check glucose as directed 1 each 0   Continuous Glucose Sensor (FREESTYLE LIBRE 3 SENSOR) MISC USE AS DIRECTED TO CHECK GLUCOSE  CONTINUOUSLY, CHANGE SENSOR EVERY 14 DAYS. 6 each 0   glucose blood (ONETOUCH ULTRA) test strip Use as instructed 100 each 2   Lancets (ONETOUCH ULTRASOFT) lancets Test at least 2 times a day 100 each 2   levocetirizine (XYZAL) 5 MG tablet Take 1 tablet (5 mg total) by mouth every evening. 90 tablet  0   levothyroxine (SYNTHROID) 125 MCG tablet TAKE (1) TABLET BY MOUTH ONCE DAILY. 90 tablet 0   lisinopril (ZESTRIL) 20 MG tablet Take 1 tablet (20 mg total) by mouth daily with breakfast. 90 tablet 1   metFORMIN (GLUCOPHAGE) 1000 MG tablet TAKE 1 TABLET BY MOUTH TWO TIMES DAILY WITH A MEAL 180 tablet 0   PARoxetine (PAXIL) 20 MG tablet TAKE 1 TABLET BY MOUTH DAILY. 90 tablet 0   No facility-administered medications prior to visit.    No Known Allergies  Review of Systems  Constitutional:  Negative for chills and fever.  HENT:  Positive for congestion, postnasal drip and sinus pressure. Negative for sore throat.   Eyes:  Negative for pain and discharge.  Respiratory:  Positive for cough. Negative for shortness of breath.   Cardiovascular:  Negative for chest pain and palpitations.  Gastrointestinal:  Negative for constipation, diarrhea and vomiting.  Endocrine: Negative for polydipsia and polyuria.  Genitourinary:  Negative for dysuria and hematuria.  Musculoskeletal:  Positive for arthralgias. Negative for neck pain and neck stiffness.  Skin:  Negative for rash.  Neurological:  Positive for headaches. Negative for dizziness, weakness and numbness.  Psychiatric/Behavioral:  Negative for agitation and behavioral problems.        Objective:    Physical Exam Vitals reviewed.  Constitutional:      General: He is not in acute distress.    Appearance: He is not diaphoretic.  HENT:     Head: Normocephalic and atraumatic.     Nose: Congestion present.     Mouth/Throat:     Mouth: Mucous membranes are moist.  Eyes:     General: No scleral icterus.    Extraocular Movements: Extraocular  movements intact.  Cardiovascular:     Rate and Rhythm: Normal rate and regular rhythm.     Heart sounds: Normal heart sounds. No murmur heard. Pulmonary:     Breath sounds: Normal breath sounds. No wheezing or rales.  Musculoskeletal:     Left shoulder: No tenderness. Normal range of motion.     Cervical back: Neck supple. No tenderness.     Right lower leg: No edema.     Left lower leg: No edema.     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     Findings: No rash.  Neurological:     General: No focal deficit present.     Mental Status: He is alert and oriented to person, place, and time.  Psychiatric:        Mood and Affect: Mood normal.        Behavior: Behavior normal.     BP (!) 146/86 (BP Location: Left Arm)   Pulse 74   Ht 6' (1.829 m)   Wt 198 lb 6.4 oz (90 kg)   SpO2 97%   BMI 26.91 kg/m  Wt Readings from Last 3 Encounters:  04/20/23 198 lb 6.4 oz (90 kg)  02/21/23 192 lb 6.4 oz (87.3 kg)  02/14/23 190 lb (86.2 kg)        Assessment & Plan:   Problem List Items Addressed This Visit       Respiratory   Allergic sinusitis    Currently on Xyzal Has azelastine nasal spray      Relevant Medications   promethazine-dextromethorphan (PROMETHAZINE-DM) 6.25-15 MG/5ML syrup   URTI (acute upper respiratory infection) - Primary    Likely viral URTI Check rapid flu and COVID RT-PCR Tylenol  as needed for myalgias Promethazine DM syrup as needed for cough If viral testing negative with persistent symptoms, will start antibiotic      Relevant Medications   promethazine-dextromethorphan (PROMETHAZINE-DM) 6.25-15 MG/5ML syrup   Other Relevant Orders   COVID-19, Flu A+B and RSV     Meds ordered this encounter  Medications   promethazine-dextromethorphan (PROMETHAZINE-DM) 6.25-15 MG/5ML syrup    Sig: Take 5 mLs by mouth 4 (four) times daily as needed for cough.    Dispense:  118 mL    Refill:  0     Medina Degraffenreid Concha Se,  MD

## 2023-04-24 LAB — COVID-19, FLU A+B AND RSV
Influenza A, NAA: NOT DETECTED
Influenza B, NAA: NOT DETECTED
RSV, NAA: NOT DETECTED
SARS-CoV-2, NAA: NOT DETECTED

## 2023-04-27 ENCOUNTER — Other Ambulatory Visit: Payer: Self-pay | Admitting: Internal Medicine

## 2023-04-27 DIAGNOSIS — E039 Hypothyroidism, unspecified: Secondary | ICD-10-CM

## 2023-04-27 DIAGNOSIS — I10 Essential (primary) hypertension: Secondary | ICD-10-CM

## 2023-04-27 DIAGNOSIS — M720 Palmar fascial fibromatosis [Dupuytren]: Secondary | ICD-10-CM | POA: Diagnosis not present

## 2023-04-27 MED ORDER — LEVOTHYROXINE SODIUM 125 MCG PO TABS
125.0000 ug | ORAL_TABLET | Freq: Every day | ORAL | 0 refills | Status: DC
Start: 2023-04-27 — End: 2023-04-27

## 2023-04-29 ENCOUNTER — Other Ambulatory Visit: Payer: Self-pay | Admitting: "Endocrinology

## 2023-04-29 DIAGNOSIS — E119 Type 2 diabetes mellitus without complications: Secondary | ICD-10-CM

## 2023-04-30 DIAGNOSIS — E119 Type 2 diabetes mellitus without complications: Secondary | ICD-10-CM | POA: Diagnosis not present

## 2023-05-02 DIAGNOSIS — M720 Palmar fascial fibromatosis [Dupuytren]: Secondary | ICD-10-CM | POA: Diagnosis not present

## 2023-05-15 ENCOUNTER — Ambulatory Visit (INDEPENDENT_AMBULATORY_CARE_PROVIDER_SITE_OTHER): Payer: PPO | Admitting: Internal Medicine

## 2023-05-15 ENCOUNTER — Encounter: Payer: Self-pay | Admitting: Internal Medicine

## 2023-05-15 VITALS — BP 144/80 | HR 90 | Ht 71.5 in | Wt 192.4 lb

## 2023-05-15 DIAGNOSIS — Z0001 Encounter for general adult medical examination with abnormal findings: Secondary | ICD-10-CM

## 2023-05-15 DIAGNOSIS — E039 Hypothyroidism, unspecified: Secondary | ICD-10-CM | POA: Diagnosis not present

## 2023-05-15 DIAGNOSIS — E782 Mixed hyperlipidemia: Secondary | ICD-10-CM | POA: Diagnosis not present

## 2023-05-15 DIAGNOSIS — E1169 Type 2 diabetes mellitus with other specified complication: Secondary | ICD-10-CM | POA: Diagnosis not present

## 2023-05-15 DIAGNOSIS — Z7984 Long term (current) use of oral hypoglycemic drugs: Secondary | ICD-10-CM | POA: Diagnosis not present

## 2023-05-15 DIAGNOSIS — J0111 Acute recurrent frontal sinusitis: Secondary | ICD-10-CM

## 2023-05-15 DIAGNOSIS — I10 Essential (primary) hypertension: Secondary | ICD-10-CM | POA: Diagnosis not present

## 2023-05-15 DIAGNOSIS — E559 Vitamin D deficiency, unspecified: Secondary | ICD-10-CM | POA: Diagnosis not present

## 2023-05-15 DIAGNOSIS — I2510 Atherosclerotic heart disease of native coronary artery without angina pectoris: Secondary | ICD-10-CM | POA: Diagnosis not present

## 2023-05-15 DIAGNOSIS — Z125 Encounter for screening for malignant neoplasm of prostate: Secondary | ICD-10-CM

## 2023-05-15 MED ORDER — LISINOPRIL 30 MG PO TABS
30.0000 mg | ORAL_TABLET | Freq: Every day | ORAL | 0 refills | Status: DC
Start: 2023-05-15 — End: 2023-05-28

## 2023-05-15 MED ORDER — AZITHROMYCIN 250 MG PO TABS
ORAL_TABLET | ORAL | 0 refills | Status: AC
Start: 2023-05-15 — End: 2023-05-20

## 2023-05-15 NOTE — Assessment & Plan Note (Addendum)
BP Readings from Last 1 Encounters:  05/15/23 (!) 144/80   Uncontrolled with Lisinopril 20 mg QD and Amlodipine 10 mg once daily Increased dose of Lisinopril to 30 mg QD Counseled for compliance with the medications Advised DASH diet and moderate exercise/walking, at least 150 mins/week

## 2023-05-15 NOTE — Assessment & Plan Note (Signed)
On statin Check lipid profile 

## 2023-05-15 NOTE — Assessment & Plan Note (Signed)
Started empiric azithromycin as  he has persistent symptoms despite symptomatic treatment Continue present testing nasal spray and Xyzal for allergies Can perform warm water gargling for sore throat Use humidifier and/or vaporizer for nasal congestion

## 2023-05-15 NOTE — Assessment & Plan Note (Addendum)
Lab Results  Component Value Date   HGBA1C 6.5 02/21/2023   Well-controlled Associated with HTN and HLD On Metformin 1000 mg twice daily Followed by endocrinology Advised to follow diabetic diet On ACE and statin F/u CMP and lipid panel Diabetic eye exam: Advised to follow up with Ophthalmology for diabetic eye exam

## 2023-05-15 NOTE — Assessment & Plan Note (Signed)
Ordered PSA after discussing its limitations for prostate cancer screening, including false positive results leading to additional investigations. 

## 2023-05-15 NOTE — Patient Instructions (Addendum)
Please start taking Azithromycin as prescribed.  Please continue to take cough syrup as needed for cough.  Please start taking Lisinopril 30 mg instead of 20 mg once daily.  Please continue to take medications as prescribed.  Please continue to follow low carb diet and perform moderate exercise/walking at least 150 mins/week.  Please get fasting blood tests done before the next visit.  Please consider getting Shingrix and Tdap vaccine at local pharmacy.

## 2023-05-15 NOTE — Assessment & Plan Note (Signed)
Lab Results  Component Value Date   TSH 3.090 08/14/2022   On Levothyrozine 125 mcg QD Checked TSH and free T4

## 2023-05-15 NOTE — Progress Notes (Signed)
Established Patient Office Visit  Subjective:  Patient ID: Jim Salazar, male    DOB: 31-May-1951  Age: 72 y.o. MRN: [id number]  CC:  Chief Complaint  Patient presents with   Annual Exam   URI    URI symptoms since Thursday     [name] is a 72 y.o. male with past medical history of CAD, HTN, type II DM, hypothyroidism and HLD who presents for annual physical.  HTN and CAD: BP is elevated today. His BP stays around 140s/80s at home as well. Takes lisinopril 20 mg once daily and amlodipine 10 mg once daily regularly. Patient denies headache, dizziness, chest pain, dyspnea or palpitations.  Type II DM: His HbA1c was 6.5 in 07/24.  He is on metformin 1000 mg BID. He has been trying to follow low-carb diet with the help of nutritionist. Denies any polyuria or polydipsia.   HLD: He takes atorvastatin.  Hypothyroidism: He has been taking Levothyroxine 125 mcg QD. Denies any constipation, diarrhea, weight change recently, hair or nail changes.  He complains of nasal congestion, postnasal drip and sore throat for the last 5 days.  He also reports sinus pressure related headache.  He has been taking Xyzal for allergies and uses Astelin nasal spray.  He is also taking promethazine DM syrup for cough.  Denies any fever, chills, dyspnea or wheezing currently.  Past Medical History:  Diagnosis Date   CAD (coronary artery disease), native coronary artery    mild nonobstructive plaque in LCx and RCA and mildly obstructive minimally calcified plaque in mid LAD on coronary CTA 11/2017   Depression    Diabetes mellitus without complication (HCC)    GERD (gastroesophageal reflux disease)    Hypertension    Hypothyroidism, unspecified    Left sided numbness 09/17/2017   Major depressive disorder, single episode, unspecified    Need for immunization against influenza 06/12/2019   Need for vaccination against Streptococcus pneumoniae using pneumococcal conjugate vaccine 13 06/12/2019    Obesity, unspecified    Right upper quadrant abdominal pain 09/22/2019   Screening for prostate cancer 06/12/2019   Syncope 10/17/2017   Thyroid disease    Tremor, essential 11/30/2017   Type 2 diabetes mellitus with hyperglycemia (HCC)     Past Surgical History:  Procedure Laterality Date   APPENDECTOMY     COLONOSCOPY     TONSILLECTOMY      Family History  Problem Relation Age of Onset   Kidney disease Mother    Hypertension Mother    Thyroid disease Mother    Diabetes Mellitus II Mother    Hyperlipidemia Mother    Stroke Mother    Hypertension Father    Cancer Father        lung   Hypertension Other    Colon cancer Neg Hx    Colon polyps Neg Hx    Esophageal cancer Neg Hx    Rectal cancer Neg Hx    Stomach cancer Neg Hx     Social History   Socioeconomic History   Marital status: Married    Spouse name: [name]    Number of children: 2   Years of education: Nature conservation officer education level: Master's degree (e.g., MA, MS, MEng, MEd, MSW, MBA)  Occupational History   Occupation: semi retired     Comment: Education officer, environmental   Tobacco Use   Smoking status: Never   Smokeless tobacco: Never  Vaping Use   Vaping status: Never Used  Substance and  Sexual Activity   Alcohol use: No   Drug use: No   Sexual activity: Yes    Birth control/protection: None  Other Topics Concern   Not on file  Social History Narrative   Lives with 2nd wife [name] -married 10 years      3 dogs: 2 inside and 1 outside: Skimp; Ginger; Eve      Enjoys: model railroad Proofreader, Tax adviser some      Diet: eats all food groups -raisins    Caffeine use: 1-2 cups decaf coffee daily, diet coke sometimes   Water: 4-5 bottles a day       Wears seat belt   Does not use phone while driving-hands free    Psychologist, sport and exercise at home   Weapons in lock box   Social Determinants of Health   Financial Resource Strain: Low Risk  (02/14/2023)   Overall Financial Resource Strain (CARDIA)    Difficulty of Paying  Living Expenses: Not hard at all  Food Insecurity: No Food Insecurity (02/14/2023)   Hunger Vital Sign    Worried About Running Out of Food in the Last Year: Never true    Ran Out of Food in the Last Year: Never true  Transportation Needs: No Transportation Needs (02/14/2023)   PRAPARE - Administrator, Civil Service (Medical): No    Lack of Transportation (Non-Medical): No  Physical Activity: Sufficiently Active (02/14/2023)   Exercise Vital Sign    Days of Exercise per Week: 5 days    Minutes of Exercise per Session: 30 min  Stress: No Stress Concern Present (02/14/2023)   [employer] of Occupational Health - Occupational Stress Questionnaire    Feeling of Stress : Not at all  Social Connections: Socially Integrated (02/14/2023)   Social Connection and Isolation Panel [NHANES]    Frequency of Communication with Friends and Family: More than three times a week    Frequency of Social Gatherings with Friends and Family: More than three times a week    Attends Religious Services: More than 4 times per year    Active Member of [employer] or Organizations: Yes    Attends Engineer, structural: More than 4 times per year    Marital Status: Married  Catering manager Violence: Not At Risk (02/14/2023)   Humiliation, Afraid, Rape, and Kick questionnaire    Fear of Current or Ex-Partner: No    Emotionally Abused: No    Physically Abused: No    Sexually Abused: No    Outpatient Medications Prior to Visit  Medication Sig Dispense Refill   amLODipine (NORVASC) 10 MG tablet Take 1 tablet (10 mg total) by mouth at bedtime. 90 tablet 0   aspirin EC 81 MG tablet Take 81 mg by mouth every evening.     atorvastatin (LIPITOR) 10 MG tablet TAKE (1) TABLET BY MOUTH ONCE DAILY. 90 tablet 0   azelastine (ASTELIN) 0.1 % nasal spray Place 1 spray into both nostrils 2 (two) times daily. Use in each nostril as directed 30 mL 12   Blood Glucose Monitoring Suppl (ONE TOUCH ULTRA 2) w/Device  KIT USE TO TEST BLOOD GLUCOSE ONCE DAILY AS DIRECTED 1 kit 0   Cholecalciferol (VITAMIN D3) 125 MCG (5000 UT) CAPS Take 1 capsule (5,000 Units total) by mouth daily. 90 capsule 0   Continuous Glucose Receiver (FREESTYLE LIBRE 3 READER) DEVI 1 each by Does not apply route as directed. Use to check glucose as directed 1 each 0   Continuous Glucose  Sensor (FREESTYLE LIBRE 3 SENSOR) MISC USE AS DIRECTED TO CHECK GLUCOSE CONTINUOUSLY, CHANGE SENSOR EVERY 14 DAYS. 2 each 3   glucose blood (ONETOUCH ULTRA) test strip Use as instructed 100 each 2   Lancets (ONETOUCH ULTRASOFT) lancets Test at least 2 times a day 100 each 2   levocetirizine (XYZAL) 5 MG tablet Take 1 tablet (5 mg total) by mouth every evening. 90 tablet 0   levothyroxine (SYNTHROID) 125 MCG tablet TAKE (1) TABLET BY MOUTH ONCE DAILY. 90 tablet 0   metFORMIN (GLUCOPHAGE) 1000 MG tablet TAKE 1 TABLET BY MOUTH TWO TIMES DAILY WITH A MEAL 180 tablet 0   PARoxetine (PAXIL) 20 MG tablet TAKE 1 TABLET BY MOUTH DAILY. 90 tablet 0   promethazine-dextromethorphan (PROMETHAZINE-DM) 6.25-15 MG/5ML syrup Take 5 mLs by mouth 4 (four) times daily as needed for cough. 118 mL 0   lisinopril (ZESTRIL) 20 MG tablet Take 1 tablet (20 mg total) by mouth daily with breakfast. 90 tablet 1   No facility-administered medications prior to visit.    No Known Allergies  ROS Review of Systems  Constitutional:  Negative for chills and fever.  HENT:  Positive for congestion, postnasal drip, sinus pressure and sore throat.   Eyes:  Negative for pain and discharge.  Respiratory:  Positive for cough. Negative for shortness of breath.   Cardiovascular:  Negative for chest pain and palpitations.  Gastrointestinal:  Negative for constipation, diarrhea and vomiting.  Endocrine: Negative for polydipsia and polyuria.  Genitourinary:  Negative for dysuria and hematuria.  Musculoskeletal:  Negative for neck pain and neck stiffness.  Skin:  Negative for rash.   Neurological:  Negative for dizziness, weakness, numbness and headaches.  Psychiatric/Behavioral:  Negative for agitation and behavioral problems.       Objective:    Physical Exam Vitals reviewed.  Constitutional:      General: He is not in acute distress.    Appearance: He is not diaphoretic.  HENT:     Head: Normocephalic and atraumatic.     Nose: Congestion present.     Right Sinus: Frontal sinus tenderness present.     Left Sinus: Frontal sinus tenderness present.     Mouth/Throat:     Mouth: Mucous membranes are moist.  Eyes:     General: No scleral icterus.    Extraocular Movements: Extraocular movements intact.  Neck:     Vascular: No carotid bruit.  Cardiovascular:     Rate and Rhythm: Normal rate and regular rhythm.     Pulses: Normal pulses.     Heart sounds: Normal heart sounds. No murmur heard. Pulmonary:     Breath sounds: Normal breath sounds. No wheezing or rales.  Abdominal:     Palpations: Abdomen is soft.     Tenderness: There is no abdominal tenderness.  Musculoskeletal:     Cervical back: Neck supple. No tenderness.     Right lower leg: No edema.     Left lower leg: No edema.  Skin:    General: Skin is warm.     Findings: No rash.  Neurological:     General: No focal deficit present.     Mental Status: He is alert and oriented to person, place, and time.     Cranial Nerves: No cranial nerve deficit.     Sensory: No sensory deficit.     Motor: No weakness.  Psychiatric:        Mood and Affect: Mood normal.  Behavior: Behavior normal.     BP (!) 144/80 (BP Location: Right Arm)   Pulse 90   Ht 5' 11.5" (1.816 m)   Wt 192 lb 6.4 oz (87.3 kg)   SpO2 91%   BMI 26.46 kg/m  Wt Readings from Last 3 Encounters:  05/15/23 192 lb 6.4 oz (87.3 kg)  04/20/23 198 lb 6.4 oz (90 kg)  02/21/23 192 lb 6.4 oz (87.3 kg)    Lab Results  Component Value Date   TSH 3.090 08/14/2022   Lab Results  Component Value Date   WBC 6.4 05/09/2022    HGB 14.4 05/09/2022   HCT 42.9 05/09/2022   MCV 88 05/09/2022   PLT 228 05/09/2022   Lab Results  Component Value Date   NA 140 08/14/2022   K 4.2 08/14/2022   CO2 26 08/14/2022   GLUCOSE 158 (H) 08/14/2022   BUN 19 08/14/2022   CREATININE 1.13 08/14/2022   BILITOT 1.0 08/14/2022   ALKPHOS 75 08/14/2022   AST 14 08/14/2022   ALT 10 08/14/2022   PROT 6.5 08/14/2022   ALBUMIN 4.5 08/14/2022   CALCIUM 9.8 08/14/2022   ANIONGAP 11 04/09/2021   EGFR 70 08/14/2022   Lab Results  Component Value Date   CHOL 116 08/14/2022   Lab Results  Component Value Date   HDL 52 08/14/2022   Lab Results  Component Value Date   LDLCALC 50 08/14/2022   Lab Results  Component Value Date   TRIG 68 08/14/2022   Lab Results  Component Value Date   CHOLHDL 2.2 08/14/2022   Lab Results  Component Value Date   HGBA1C 6.5 02/21/2023      Assessment & Plan:   Problem List Items Addressed This Visit       Cardiovascular and Mediastinum   Essential hypertension    BP Readings from Last 1 Encounters:  05/15/23 (!) 144/80   Uncontrolled with Lisinopril 20 mg QD and Amlodipine 10 mg once daily Increased dose of Lisinopril to 30 mg QD Counseled for compliance with the medications Advised DASH diet and moderate exercise/walking, at least 150 mins/week      Relevant Medications   lisinopril (ZESTRIL) 30 MG tablet   Other Relevant Orders   CMP14+EGFR   CBC with Differential/Platelet   CAD (coronary artery disease), native coronary artery    Continue Aspirin 81 mg QD and statin Follows up with Cardiology      Relevant Medications   lisinopril (ZESTRIL) 30 MG tablet     Respiratory   Acute recurrent frontal sinusitis    Started empiric azithromycin as  he has persistent symptoms despite symptomatic treatment Continue present testing nasal spray and Xyzal for allergies Can perform warm water gargling for sore throat Use humidifier and/or vaporizer for nasal congestion       Relevant Medications   azithromycin (ZITHROMAX) 250 MG tablet     Endocrine   Diabetes mellitus (HCC)    Lab Results  Component Value Date   HGBA1C 6.5 02/21/2023   Well-controlled Associated with HTN and HLD On Metformin 1000 mg twice daily Followed by endocrinology Advised to follow diabetic diet On ACE and statin F/u CMP and lipid panel Diabetic eye exam: Advised to follow up with Ophthalmology for diabetic eye exam      Relevant Medications   lisinopril (ZESTRIL) 30 MG tablet   Other Relevant Orders   Hemoglobin A1c   CMP14+EGFR   Urine Microalbumin w/creat. ratio   Hypothyroidism    Lab  Results  Component Value Date   TSH 3.090 08/14/2022   On Levothyrozine 125 mcg QD Checked TSH and free T4      Relevant Orders   TSH + free T4     Other   Prostate cancer screening    Ordered PSA after discussing its limitations for prostate cancer screening, including false positive results leading to additional investigations.      Relevant Orders   PSA   Mixed hyperlipidemia    On statin Check lipid profile      Relevant Medications   lisinopril (ZESTRIL) 30 MG tablet   Other Relevant Orders   Lipid panel   Encounter for general adult medical examination with abnormal findings - Primary    Physical exam as documented. Fasting blood tests ordered. Flu vaccine postponed as he has acute sinusitis. Advised to get Shingrix and Tdap vaccines at local pharmacy.      Vitamin D deficiency   Relevant Orders   VITAMIN D 25 Hydroxy (Vit-D Deficiency, Fractures)    Meds ordered this encounter  Medications   lisinopril (ZESTRIL) 30 MG tablet    Sig: Take 1 tablet (30 mg total) by mouth daily with breakfast.    Dispense:  90 tablet    Refill:  0    DOSE CHANGE - PLEASE CANCEL 20 MG RX.   azithromycin (ZITHROMAX) 250 MG tablet    Sig: Take 2 tablets on day 1, then 1 tablet daily on days 2 through 5    Dispense:  6 tablet    Refill:  0    Follow-up: Return in about  2 months (around 07/15/2023).    Anabel Halon, MD

## 2023-05-15 NOTE — Assessment & Plan Note (Addendum)
Physical exam as documented. Fasting blood tests ordered. Flu vaccine postponed as he has acute sinusitis. Advised to get Shingrix and Tdap vaccines at local pharmacy.

## 2023-05-15 NOTE — Assessment & Plan Note (Signed)
Continue Aspirin 81 mg QD and statin Follows up with Cardiology

## 2023-05-17 LAB — MICROALBUMIN / CREATININE URINE RATIO
Creatinine, Urine: 200.9 mg/dL
Microalb/Creat Ratio: 37 mg/g{creat} — ABNORMAL HIGH (ref 0–29)
Microalbumin, Urine: 73.6 ug/mL

## 2023-05-21 ENCOUNTER — Other Ambulatory Visit: Payer: Self-pay | Admitting: Internal Medicine

## 2023-05-21 ENCOUNTER — Other Ambulatory Visit: Payer: Self-pay | Admitting: "Endocrinology

## 2023-05-21 DIAGNOSIS — E1169 Type 2 diabetes mellitus with other specified complication: Secondary | ICD-10-CM

## 2023-05-24 DIAGNOSIS — M720 Palmar fascial fibromatosis [Dupuytren]: Secondary | ICD-10-CM | POA: Diagnosis not present

## 2023-05-25 ENCOUNTER — Other Ambulatory Visit: Payer: Self-pay | Admitting: Internal Medicine

## 2023-05-25 ENCOUNTER — Encounter: Payer: Self-pay | Admitting: Internal Medicine

## 2023-05-28 ENCOUNTER — Other Ambulatory Visit: Payer: Self-pay

## 2023-05-28 DIAGNOSIS — I10 Essential (primary) hypertension: Secondary | ICD-10-CM

## 2023-05-28 MED ORDER — LISINOPRIL 30 MG PO TABS: 30.0000 mg | ORAL_TABLET | Freq: Every day | ORAL | 0 refills | Status: DC

## 2023-05-29 DIAGNOSIS — M720 Palmar fascial fibromatosis [Dupuytren]: Secondary | ICD-10-CM | POA: Diagnosis not present

## 2023-06-01 ENCOUNTER — Telehealth: Payer: Self-pay | Admitting: Internal Medicine

## 2023-06-01 NOTE — Telephone Encounter (Signed)
Robin called from [facility]  Received the lisinopril 20 mg instead the 30 mg   Patient says should be sending in Lisinopril 30 mg

## 2023-06-04 ENCOUNTER — Other Ambulatory Visit: Payer: Self-pay

## 2023-06-04 DIAGNOSIS — I10 Essential (primary) hypertension: Secondary | ICD-10-CM

## 2023-06-04 MED ORDER — LISINOPRIL 30 MG PO TABS: 30.0000 mg | ORAL_TABLET | Freq: Every day | ORAL | 0 refills | Status: DC

## 2023-06-04 NOTE — Telephone Encounter (Signed)
Lisinopril 30 was sent on 05/28/2023. Resent today

## 2023-06-05 DIAGNOSIS — M720 Palmar fascial fibromatosis [Dupuytren]: Secondary | ICD-10-CM | POA: Diagnosis not present

## 2023-07-13 ENCOUNTER — Other Ambulatory Visit: Payer: Self-pay | Admitting: Internal Medicine

## 2023-08-16 ENCOUNTER — Other Ambulatory Visit: Payer: Self-pay | Admitting: "Endocrinology

## 2023-08-16 ENCOUNTER — Other Ambulatory Visit: Payer: Self-pay | Admitting: Internal Medicine

## 2023-08-16 DIAGNOSIS — E1169 Type 2 diabetes mellitus with other specified complication: Secondary | ICD-10-CM

## 2023-08-16 DIAGNOSIS — I10 Essential (primary) hypertension: Secondary | ICD-10-CM

## 2023-08-16 NOTE — Telephone Encounter (Signed)
Clarification needed. Last office visit states pt is to continue Metformin 1000mg  daily. Last Rx refill sent in was for Metformin 1000mg  bid which is how pt has been taking medication. Pt has an appt on 08/24/23.

## 2023-08-20 DIAGNOSIS — E559 Vitamin D deficiency, unspecified: Secondary | ICD-10-CM | POA: Diagnosis not present

## 2023-08-20 DIAGNOSIS — E119 Type 2 diabetes mellitus without complications: Secondary | ICD-10-CM | POA: Diagnosis not present

## 2023-08-20 DIAGNOSIS — E1169 Type 2 diabetes mellitus with other specified complication: Secondary | ICD-10-CM | POA: Diagnosis not present

## 2023-08-20 DIAGNOSIS — E782 Mixed hyperlipidemia: Secondary | ICD-10-CM | POA: Diagnosis not present

## 2023-08-20 DIAGNOSIS — E039 Hypothyroidism, unspecified: Secondary | ICD-10-CM | POA: Diagnosis not present

## 2023-08-20 DIAGNOSIS — I10 Essential (primary) hypertension: Secondary | ICD-10-CM | POA: Diagnosis not present

## 2023-08-21 ENCOUNTER — Other Ambulatory Visit: Payer: Self-pay | Admitting: Internal Medicine

## 2023-08-21 LAB — CMP14+EGFR
ALT: 7 [IU]/L (ref 0–44)
AST: 13 [IU]/L (ref 0–40)
Albumin: 4.6 g/dL (ref 3.9–4.9)
Alkaline Phosphatase: 78 [IU]/L (ref 44–121)
BUN/Creatinine Ratio: 14 (ref 10–24)
BUN: 12 mg/dL (ref 8–27)
Bilirubin Total: 1.4 mg/dL — ABNORMAL HIGH (ref 0.0–1.2)
CO2: 25 mmol/L (ref 20–29)
Calcium: 9.5 mg/dL (ref 8.6–10.2)
Chloride: 99 mmol/L (ref 96–106)
Creatinine, Ser: 0.88 mg/dL (ref 0.76–1.27)
Globulin, Total: 2.2 g/dL (ref 1.5–4.5)
Glucose: 144 mg/dL — ABNORMAL HIGH (ref 70–99)
Potassium: 4.3 mmol/L (ref 3.5–5.2)
Sodium: 138 mmol/L (ref 134–144)
Total Protein: 6.8 g/dL (ref 6.0–8.5)
eGFR: 93 mL/min/{1.73_m2} (ref >59)

## 2023-08-21 LAB — CBC WITH DIFFERENTIAL/PLATELET
Basophils Absolute: 0.1 10*3/uL (ref 0.0–0.2)
Basos: 1 %
EOS (ABSOLUTE): 0.1 10*3/uL (ref 0.0–0.4)
Eos: 2 %
Hematocrit: 43.6 % (ref 37.5–51.0)
Hemoglobin: 14.6 g/dL (ref 13.0–17.7)
Immature Grans (Abs): 0 10*3/uL (ref 0.0–0.1)
Immature Granulocytes: 0 %
Lymphocytes Absolute: 1.5 10*3/uL (ref 0.7–3.1)
Lymphs: 31 %
MCH: 29.4 pg (ref 26.6–33.0)
MCHC: 33.5 g/dL (ref 31.5–35.7)
MCV: 88 fL (ref 79–97)
Monocytes Absolute: 0.4 10*3/uL (ref 0.1–0.9)
Monocytes: 9 %
Neutrophils Absolute: 2.7 10*3/uL (ref 1.4–7.0)
Neutrophils: 57 %
Platelets: 227 10*3/uL (ref 150–450)
RBC: 4.97 x10E6/uL (ref 4.14–5.80)
RDW: 13.5 % (ref 11.6–15.4)
WBC: 4.8 10*3/uL (ref 3.4–10.8)

## 2023-08-21 LAB — HEMOGLOBIN A1C
Est. average glucose Bld gHb Est-mCnc: 163 mg/dL
Hgb A1c MFr Bld: 7.3 % — ABNORMAL HIGH (ref 4.8–5.6)

## 2023-08-21 LAB — T4, FREE: Free T4: 1.8 ng/dL — ABNORMAL HIGH (ref 0.82–1.77)

## 2023-08-21 LAB — COMPREHENSIVE METABOLIC PANEL
ALT: 10 [IU]/L (ref 0–44)
AST: 13 [IU]/L (ref 0–40)
Albumin: 4.5 g/dL (ref 3.9–4.9)
Alkaline Phosphatase: 75 [IU]/L (ref 44–121)
BUN/Creatinine Ratio: 15 (ref 10–24)
BUN: 14 mg/dL (ref 8–27)
Bilirubin Total: 1.4 mg/dL — ABNORMAL HIGH (ref 0.0–1.2)
CO2: 25 mmol/L (ref 20–29)
Calcium: 9.5 mg/dL (ref 8.6–10.2)
Chloride: 101 mmol/L (ref 96–106)
Creatinine, Ser: 0.94 mg/dL (ref 0.76–1.27)
Globulin, Total: 2.1 g/dL (ref 1.5–4.5)
Glucose: 144 mg/dL — ABNORMAL HIGH (ref 70–99)
Potassium: 4.2 mmol/L (ref 3.5–5.2)
Sodium: 141 mmol/L (ref 134–144)
Total Protein: 6.6 g/dL (ref 6.0–8.5)
eGFR: 87 mL/min/{1.73_m2} (ref >59)

## 2023-08-21 LAB — LIPID PANEL
Chol/HDL Ratio: 2.3 {ratio} (ref 0.0–5.0)
Chol/HDL Ratio: 2.4 {ratio} (ref 0.0–5.0)
Cholesterol, Total: 124 mg/dL (ref 100–199)
Cholesterol, Total: 130 mg/dL (ref 100–199)
HDL: 54 mg/dL (ref >39)
HDL: 55 mg/dL (ref >39)
LDL Chol Calc (NIH): 56 mg/dL (ref 0–99)
LDL Chol Calc (NIH): 61 mg/dL (ref 0–99)
Triglycerides: 68 mg/dL (ref 0–149)
Triglycerides: 70 mg/dL (ref 0–149)
VLDL Cholesterol Cal: 14 mg/dL (ref 5–40)
VLDL Cholesterol Cal: 14 mg/dL (ref 5–40)

## 2023-08-21 LAB — TSH+FREE T4
Free T4: 1.81 ng/dL — ABNORMAL HIGH (ref 0.82–1.77)
TSH: 0.682 u[IU]/mL (ref 0.450–4.500)

## 2023-08-21 LAB — VITAMIN D 25 HYDROXY (VIT D DEFICIENCY, FRACTURES): Vit D, 25-Hydroxy: 31.4 ng/mL (ref 30.0–100.0)

## 2023-08-21 LAB — TSH: TSH: 0.654 u[IU]/mL (ref 0.450–4.500)

## 2023-08-24 ENCOUNTER — Ambulatory Visit: Payer: PPO | Admitting: "Endocrinology

## 2023-08-28 ENCOUNTER — Ambulatory Visit (INDEPENDENT_AMBULATORY_CARE_PROVIDER_SITE_OTHER): Payer: PPO | Admitting: "Endocrinology

## 2023-08-28 ENCOUNTER — Other Ambulatory Visit: Payer: Self-pay | Admitting: "Endocrinology

## 2023-08-28 ENCOUNTER — Encounter: Payer: Self-pay | Admitting: "Endocrinology

## 2023-08-28 VITALS — BP 138/76 | HR 56 | Ht 71.5 in | Wt 189.0 lb

## 2023-08-28 DIAGNOSIS — I10 Essential (primary) hypertension: Secondary | ICD-10-CM

## 2023-08-28 DIAGNOSIS — E039 Hypothyroidism, unspecified: Secondary | ICD-10-CM

## 2023-08-28 DIAGNOSIS — E782 Mixed hyperlipidemia: Secondary | ICD-10-CM

## 2023-08-28 DIAGNOSIS — Z7984 Long term (current) use of oral hypoglycemic drugs: Secondary | ICD-10-CM | POA: Diagnosis not present

## 2023-08-28 DIAGNOSIS — E119 Type 2 diabetes mellitus without complications: Secondary | ICD-10-CM

## 2023-08-28 MED ORDER — METFORMIN HCL ER (OSM) 1000 MG PO TB24: 1000.0000 mg | ORAL_TABLET | Freq: Every day | ORAL | 1 refills | Status: DC

## 2023-08-28 NOTE — Progress Notes (Signed)
 08/28/2023, 5:23 PM  Endocrinology follow-up note   Subjective:    Patient ID: Jim Salazar, male    DOB: Dec 12, 1950.  PAOLA FLYNT is being seen in follow-up after he was seen in consultation for  management of currently uncontrolled symptomatic diabetes requested by  Tobie Suzzane POUR, MD.   Past Medical History:  Diagnosis Date   CAD (coronary artery disease), native coronary artery    mild nonobstructive plaque in LCx and RCA and mildly obstructive minimally calcified plaque in mid LAD on coronary CTA 11/2017   Depression    Diabetes mellitus without complication (HCC)    GERD (gastroesophageal reflux disease)    Hypertension    Hypothyroidism, unspecified    Left sided numbness 09/17/2017   Major depressive disorder, single episode, unspecified    Need for immunization against influenza 06/12/2019   Need for vaccination against Streptococcus pneumoniae using pneumococcal conjugate vaccine 13 06/12/2019   Obesity, unspecified    Right upper quadrant abdominal pain 09/22/2019   Screening for prostate cancer 06/12/2019   Syncope 10/17/2017   Thyroid  disease    Tremor, essential 11/30/2017   Type 2 diabetes mellitus with hyperglycemia (HCC)     Past Surgical History:  Procedure Laterality Date   APPENDECTOMY     COLONOSCOPY     HAND SURGERY Left 08/2022   TONSILLECTOMY      Social History   Socioeconomic History   Marital status: Married    Spouse name: [name]    Number of children: 2   Years of education: Automotive Engineer   Highest education level: Master's degree (e.g., MA, MS, MEng, MEd, MSW, MBA)  Occupational History   Occupation: semi retired     Comment: Education Officer, Environmental   Tobacco Use   Smoking status: Never   Smokeless tobacco: Never  Vaping Use   Vaping status: Never Used  Substance and Sexual Activity   Alcohol use: No   Drug use: No   Sexual activity: Yes    Birth control/protection: None  Other Topics Concern   Not on file  Social  History Narrative   Lives with 2nd wife [name] -married 10 years      3 dogs: 2 inside and 1 outside: Skimp; Ginger; Eve      Enjoys: model railroad proofreader, tax adviser some      Diet: eats all food groups -raisins    Caffeine use: 1-2 cups decaf coffee daily, diet coke sometimes   Water: 4-5 bottles a day       Wears seat belt   Does not use phone while driving-hands free    Psychologist, sport and exercise at home   Weapons in lock box   Social Drivers of Health   Financial Resource Strain: Low Risk  (02/14/2023)   Overall Financial Resource Strain (CARDIA)    Difficulty of Paying Living Expenses: Not hard at all  Food Insecurity: No Food Insecurity (02/14/2023)   Hunger Vital Sign    Worried About Running Out of Food in the Last Year: Never true    Ran Out of Food in the Last Year: Never true  Transportation Needs: No Transportation Needs (02/14/2023)   PRAPARE - Administrator, Civil Service (Medical): No    Lack of Transportation (Non-Medical): No  Physical Activity: Sufficiently Active (02/14/2023)   Exercise Vital Sign    Days of Exercise per Week: 5 days    Minutes of Exercise per Session: 30 min  Stress: No Stress Concern Present (  02/14/2023)   [employer] of Occupational Health - Occupational Stress Questionnaire    Feeling of Stress : Not at all  Social Connections: Socially Integrated (02/14/2023)   Social Connection and Isolation Panel [NHANES]    Frequency of Communication with Friends and Family: More than three times a week    Frequency of Social Gatherings with Friends and Family: More than three times a week    Attends Religious Services: More than 4 times per year    Active Member of [employer] or Organizations: Yes    Attends Engineer, Structural: More than 4 times per year    Marital Status: Married    Family History  Problem Relation Age of Onset   Kidney disease Mother    Hypertension Mother    Thyroid  disease Mother    Diabetes Mellitus II Mother     Hyperlipidemia Mother    Stroke Mother    Hypertension Father    Cancer Father        lung   Hypertension Other    Colon cancer Neg Hx    Colon polyps Neg Hx    Esophageal cancer Neg Hx    Rectal cancer Neg Hx    Stomach cancer Neg Hx     Outpatient Encounter Medications as of 08/28/2023  Medication Sig   [DISCONTINUED] metformin  (FORTAMET ) 1000 MG (OSM) 24 hr tablet Take 1 tablet (1,000 mg total) by mouth daily with breakfast.   amLODipine  (NORVASC ) 10 MG tablet Take 1 tablet (10 mg total) by mouth at bedtime.   aspirin  EC 81 MG tablet Take 81 mg by mouth every evening.   atorvastatin  (LIPITOR) 10 MG tablet TAKE (1) TABLET BY MOUTH ONCE DAILY.   azelastine  (ASTELIN ) 0.1 % nasal spray Place 1 spray into both nostrils 2 (two) times daily. Use in each nostril as directed   Blood Glucose Monitoring Suppl (ONE TOUCH ULTRA 2) w/Device KIT USE TO TEST BLOOD GLUCOSE ONCE DAILY AS DIRECTED   Cholecalciferol (VITAMIN D3) 125 MCG (5000 UT) CAPS Take 1 capsule (5,000 Units total) by mouth daily.   Continuous Glucose Receiver (FREESTYLE LIBRE 3 READER) DEVI 1 each by Does not apply route as directed. Use to check glucose as directed   Continuous Glucose Sensor (FREESTYLE LIBRE 3 SENSOR) MISC USE AS DIRECTED TO CHECK GLUCOSE CONTINUOUSLY, CHANGE SENSOR EVERY 14 DAYS.   glucose blood (ONETOUCH ULTRA) test strip Use as instructed   Lancets (ONETOUCH ULTRASOFT) lancets Test at least 2 times a day   levocetirizine (XYZAL ) 5 MG tablet Take 1 tablet (5 mg total) by mouth every evening.   levothyroxine  (SYNTHROID ) 125 MCG tablet TAKE (1) TABLET BY MOUTH ONCE DAILY.   lisinopril  (ZESTRIL ) 30 MG tablet Take 1 tablet (30 mg total) by mouth daily with breakfast.   PARoxetine  (PAXIL ) 20 MG tablet TAKE 1 TABLET BY MOUTH DAILY.   promethazine -dextromethorphan (PROMETHAZINE -DM) 6.25-15 MG/5ML syrup Take 5 mLs by mouth 4 (four) times daily as needed for cough.   [DISCONTINUED] metFORMIN  (GLUCOPHAGE ) 1000 MG  tablet TAKE 1 TABLET BY MOUTH TWO TIMES DAILY WITH A MEAL.   No facility-administered encounter medications on file as of 08/28/2023.    ALLERGIES: No Known Allergies  VACCINATION STATUS: Immunization History  Administered Date(s) Administered   Fluad Quad(high Dose 65+) 04/29/2020, 04/26/2021, 05/09/2022   Influenza Inj Mdck Quad Pf 05/23/2016   Influenza Inj Mdck Quad With Preservative 05/24/2017   Influenza, High Dose Seasonal PF 05/25/2018   Influenza,inj,Quad PF,6+ Mos 06/12/2019   Influenza-Unspecified  08/04/2012, 04/22/2013, 05/24/2017   Janssen (J&J) SARS-COV-2 Vaccination 10/17/2019   Pneumococcal Conjugate-13 06/12/2019   Pneumococcal Polysaccharide-23 07/20/2017    Diabetes He presents for his follow-up diabetic visit. He has type 2 diabetes mellitus. Onset time: He was diagnosed at approximate age of 23 years. His disease course has been fluctuating. There are no hypoglycemic associated symptoms. Pertinent negatives for hypoglycemia include no confusion, headaches, pallor or seizures. Pertinent negatives for diabetes include no chest pain, no fatigue, no polydipsia, no polyphagia, no polyuria and no weakness. There are no hypoglycemic complications. Symptoms are improving. There are no diabetic complications. Risk factors for coronary artery disease include diabetes mellitus, dyslipidemia, family history, hypertension, sedentary lifestyle and male sex. Current diabetic treatment includes oral agent (monotherapy). His weight is fluctuating minimally. When asked about meal planning, he reported none. He has had a previous visit with a dietitian. He participates in exercise intermittently. His home blood glucose trend is fluctuating minimally. His breakfast blood glucose range is generally 140-180 mg/dl. His lunch blood glucose range is generally 140-180 mg/dl. His dinner blood glucose range is generally 140-180 mg/dl. His bedtime blood glucose range is generally 140-180 mg/dl. His  overall blood glucose range is 140-180 mg/dl. (Mr. Aikey wears a CGM device at this time.  He presents with his device which was downloaded and reviewed.  His AGP report shows 82% time in range, 17% level 1 hyperglycemia, 1% level 2 hyperglycemia.  He does not have sustained hypoglycemia.  His average blood glucose 153 for the most recent 2 weeks.  His recent A1c was 7.3% progressively increasing from 6.5% during his prior visit.  Admittedly, he has been skipping at least one of his metformin  doses 2-3 times a week.  She is engaged in lifestyle medicine, maintaining the weight loss he has achieved prior to his last visit.  ) An ACE inhibitor/angiotensin II receptor blocker is being taken. Eye exam is current.  Hyperlipidemia This is a chronic problem. The current episode started more than 1 year ago. Exacerbating diseases include diabetes. Pertinent negatives include no chest pain, myalgias or shortness of breath. Current antihyperlipidemic treatment includes statins and diet change. Risk factors for coronary artery disease include dyslipidemia, diabetes mellitus, male sex and family history.  Hypertension This is a chronic problem. The current episode started more than 1 year ago. Pertinent negatives include no chest pain, headaches, neck pain, palpitations or shortness of breath. Risk factors for coronary artery disease include dyslipidemia, diabetes mellitus, family history, male gender and sedentary lifestyle. Past treatments include ACE inhibitors.     Review of Systems  Constitutional:  Negative for chills, fatigue, fever and unexpected weight change.  HENT:  Negative for dental problem, mouth sores and trouble swallowing.   Eyes:  Negative for visual disturbance.  Respiratory:  Negative for cough, choking, chest tightness, shortness of breath and wheezing.   Cardiovascular:  Negative for chest pain, palpitations and leg swelling.  Gastrointestinal:  Negative for abdominal distention, abdominal  pain, constipation, diarrhea, nausea and vomiting.  Endocrine: Negative for polydipsia, polyphagia and polyuria.  Genitourinary:  Negative for dysuria, flank pain, hematuria and urgency.  Musculoskeletal:  Negative for back pain, gait problem, myalgias and neck pain.  Skin:  Negative for pallor, rash and wound.  Neurological:  Negative for seizures, syncope, weakness, numbness and headaches.  Psychiatric/Behavioral:  Negative for confusion and dysphoric mood.     Objective:       08/28/2023    2:16 PM 05/15/2023    8:46 AM 05/15/2023  8:15 AM  Vitals with BMI  Height 5' 11.5    Weight 189 lbs    BMI 26    Systolic 138 144 845  Diastolic 76 80 82  Pulse 56      BP 138/76   Pulse (!) 56   Ht 5' 11.5 (1.816 m)   Wt 189 lb (85.7 kg)   BMI 25.99 kg/m   Wt Readings from Last 3 Encounters:  08/28/23 189 lb (85.7 kg)  05/15/23 192 lb 6.4 oz (87.3 kg)  04/20/23 198 lb 6.4 oz (90 kg)       CMP ( most recent) CMP     Component Value Date/Time   NA 141 08/20/2023 0953   K 4.2 08/20/2023 0953   CL 101 08/20/2023 0953   CO2 25 08/20/2023 0953   GLUCOSE 144 (H) 08/20/2023 0953   GLUCOSE 302 (H) 04/09/2021 2059   BUN 14 08/20/2023 0953   CREATININE 0.94 08/20/2023 0953   CREATININE 1.02 06/22/2020 0847   CALCIUM  9.5 08/20/2023 0953   PROT 6.6 08/20/2023 0953   ALBUMIN 4.5 08/20/2023 0953   AST 13 08/20/2023 0953   ALT 10 08/20/2023 0953   ALKPHOS 75 08/20/2023 0953   BILITOT 1.4 (H) 08/20/2023 0953   GFRNONAA >60 04/09/2021 2059   GFRNONAA 76 06/22/2020 0847   GFRAA 88 06/22/2020 0847     Diabetic Labs (most recent): Lab Results  Component Value Date   HGBA1C 7.3 (H) 08/20/2023   HGBA1C 6.5 02/21/2023   HGBA1C 6.7 08/16/2022   MICROALBUR 10 04/04/2022     Lipid Panel ( most recent) Lipid Panel     Component Value Date/Time   CHOL 124 08/20/2023 0953   TRIG 68 08/20/2023 0953   HDL 54 08/20/2023 0953   CHOLHDL 2.3 08/20/2023 0953   CHOLHDL 3.0  09/23/2019 0746   VLDL 25 09/17/2017 0843   LDLCALC 56 08/20/2023 0953   LDLCALC 47 09/23/2019 0746   LABVLDL 14 08/20/2023 0953      Lab Results  Component Value Date   TSH 0.654 08/20/2023   TSH 0.682 08/20/2023   TSH 3.090 08/14/2022   TSH 0.650 12/22/2021   TSH 4.980 (H) 04/26/2021   TSH 0.322 (L) 04/09/2021   TSH 1.240 09/22/2020   TSH 7.26 (H) 09/23/2019   TSH 2.643 09/17/2017   FREET4 1.80 (H) 08/20/2023   FREET4 1.81 (H) 08/20/2023   FREET4 1.41 08/14/2022   FREET4 1.74 12/22/2021   FREET4 1.39 04/26/2021   FREET4 1.40 09/22/2020      Assessment & Plan:   1. Poorly controlled type 2 diabetes mellitus (HCC)  - BRALLAN DENIO has currently uncontrolled symptomatic type 2 DM since  73 years of age.  Mr. Matton wears a CGM device at this time.  He presents with his device which was downloaded and reviewed.  His AGP report shows 82% time in range, 17% level 1 hyperglycemia, 1% level 2 hyperglycemia.  He does not have sustained hypoglycemia.  His average blood glucose 153 for the most recent 2 weeks.  His recent A1c was 7.3% progressively increasing from 6.5% during his prior visit.  Admittedly, he has been skipping at least one of his metformin  doses 2-3 times a week.  She is engaged in lifestyle medicine, maintaining the weight loss he has achieved prior to his last visit.    - I had a long discussion with him about the progressive nature of diabetes and the pathology behind its complications. -He does not  report gross complications from diabetes, however, he remains at a high risk for more acute and chronic complications which include CAD, CVA, CKD, retinopathy, and neuropathy. These are all discussed in detail with him.  - I have counseled him on diet  and weight management  by adopting a carbohydrate restricted/protein rich diet. Patient is encouraged to switch to  unprocessed or minimally processed     complex starch and increased protein intake (animal or plant  source), fruits, and vegetables. -  he is advised to stick to a routine mealtimes to eat 3 meals  a day and avoid unnecessary snacks ( to snack only to correct hypoglycemia).  In light of his metabolic dysfunction with multiple chronic conditions , he remains a good candidate for lifestyle medicine. -He is encouraged to stay engaged in lifestyle medicine.  - he acknowledges that there is a room for improvement in his food and drink choices. - Suggestion is made for him to avoid simple carbohydrates  from his diet including Cakes, Sweet Desserts, Ice Cream, Soda (diet and regular), Sweet Tea, Candies, Chips, Cookies, Store Bought Juices, Alcohol , Artificial Sweeteners,  Coffee Creamer, and Sugar-free Products, Lemonade. This will help patient to have more stable blood glucose profile and potentially avoid unintended weight gain.  The following Lifestyle Medicine recommendations according to American College of Lifestyle Medicine  [facility]) were discussed and and offered to patient and he  agrees to start the journey:  A. Whole Foods, Plant-Based Nutrition comprising of fruits and vegetables, plant-based proteins, whole-grain carbohydrates was discussed in detail with the patient.   A list for source of those nutrients were also provided to the patient.  Patient will use only water or unsweetened tea for hydration. B.  The need to stay away from risky substances including alcohol, smoking; obtaining 7 to 9 hours of restorative sleep, at least 150 minutes of moderate intensity exercise weekly, the importance of healthy social connections,  and stress management techniques were discussed. C.  A full color page of  Calorie density of various food groups per pound showing examples of each food groups was provided to the patient.   - I have approached him with the following individualized plan to manage  his diabetes and patient agrees:   -Admittedly, he is skipping a few doses of metformin  weekly, however  promises to do better.  He is advised to finish his current metformin  1000 mg p.o. twice daily with breakfast and supper.   After that, he will be considered for metformin  1000 mg XR p.o. daily at breakfast.   -He did not tolerate GLP-1 receptor agonist in the past.  He will be considered for low-dose SGLT2 inhibitors if he reports hyperglycemia.  -He is advised to continue to use his CGM.   He is advised to call clinic for blood glucose readings greater than 200 mg per DL at fasting.  - Specific targets for  A1c;  LDL, HDL,  and Triglycerides were discussed with the patient.  2) Blood Pressure /Hypertension:  His blood pressure is controlled to target. The recommended lifestyle nutrition will help him control hypertension as well.   He is currently on lisinopril  20 mg p.o. daily breakfast and amlodipine  10 mg p.o. daily at supper.  He is advised to take both of his medications at breakfast since he is not consistent on the amlodipine  in the afternoon.    3) Lipids/Hyperlipidemia:   Review of his recent lipid panel showed controlled LDL at 56.  he  is advised to continue atorvastatin  10 mg p.o. daily at bedtime.  Side effects and precautions discussed with him.  4)  Weight/Diet:  Body mass index is 25.99 kg/m.  -    This patient has achieved adequate weight loss with lifestyle medicine.  he is not a candidate for major weight loss.   I discussed with him the fact that loss of 5 - 10% of his  current body weight will have the most impact on his diabetes management.  Exercise, and detailed carbohydrates information provided  -  detailed on discharge instructions.  5) hypothyroidism-the circumstance of his diagnosis are not available to review. He is advised to continue Synthroid  125 mcg p.o. daily before breakfast.     - We discussed about the correct intake of his thyroid  hormone, on empty stomach at fasting, with water, separated by at least 30 minutes from breakfast and other medications,   and separated by more than 4 hours from calcium , iron, multivitamins, acid reflux medications (PPIs). -Patient is made aware of the fact that thyroid  hormone replacement is needed for life, dose to be adjusted by periodic monitoring of thyroid  function tests.  6) Chronic Care/Health Maintenance:  -he  is on ACEI/ARB and Statin medications and  is encouraged to initiate and continue to follow up with Ophthalmology, Dentist,  Podiatrist at least yearly or according to recommendations, and advised to   stay away from smoking. I have recommended yearly flu vaccine and pneumonia vaccine at least every 5 years; moderate intensity exercise for up to 150 minutes weekly; and  sleep for at least 7 hours a day.  - he is  advised to maintain close follow up with Tobie Suzzane POUR, MD for primary care needs, as well as his other providers for optimal and coordinated care.  I spent  25  minutes in the care of the patient today including review of labs from CMP, Lipids, Thyroid  Function, Hematology (current and previous including abstractions from other facilities); face-to-face time discussing  his blood glucose readings/logs, discussing hypoglycemia and hyperglycemia episodes and symptoms, medications doses, his options of short and long term treatment based on the latest standards of care / guidelines;  discussion about incorporating lifestyle medicine;  and documenting the encounter. Risk reduction counseling performed per USPSTF guidelines to reduce  cardiovascular risk factors.     Please refer to Patient Instructions for Blood Glucose Monitoring and Insulin /Medications Dosing Guide  in media tab for additional information. Please  also refer to  Patient Self Inventory in the Media  tab for reviewed elements of pertinent patient history.  Dorn VEAR Boas participated in the discussions, expressed understanding, and voiced agreement with the above plans.  All questions were answered to his satisfaction. he is  encouraged to contact clinic should he have any questions or concerns prior to his return visit.    Follow up plan: - Return in about 4 months (around 12/26/2023) for Bring Meter/CGM Device/Logs- A1c in Office.  Ranny Earl, MD [facility] [address] [place], [place] [address] Phone: [phone]  Fax: [phone]    08/28/2023, 5:23 PM  This note was partially dictated with voice recognition software. Similar sounding words can be transcribed inadequately or may not  be corrected upon review.

## 2023-08-28 NOTE — Patient Instructions (Signed)

## 2023-09-10 ENCOUNTER — Other Ambulatory Visit: Payer: Self-pay | Admitting: "Endocrinology

## 2023-09-10 MED ORDER — EMPAGLIFLOZIN 10 MG PO TABS: 10.0000 mg | ORAL_TABLET | Freq: Every day | ORAL | 1 refills | Status: DC

## 2023-09-11 ENCOUNTER — Telehealth: Payer: Self-pay

## 2023-09-11 NOTE — Telephone Encounter (Signed)
 Spoke with pt advising him it is ok to try the Metformin bid again and if it does not control his glucose the Jardiance will be a good option to add per Dr.Nida's orders. Understanding voiced.

## 2023-09-11 NOTE — Telephone Encounter (Signed)
-----   Message from Marquis Lunch sent at 09/10/2023  4:58 PM EST ----- Ok, let's try that and if that does not control, his good option is adding Jardiance. ----- Message ----- From: Derrell Lolling, LPN Sent: 0/27/2536   4:11 PM EST To: Roma Kayser, MD  Pt's wife stated pt was hoping to try taking metformin bid again instead of the extended release. Please advise. ----- Message ----- From: Roma Kayser, MD Sent: 09/10/2023  12:16 PM EST To: Derrell Lolling, LPN  I want to add Jardiance 10 mg po daily at breakfast. I will send a rx to pharmacy.

## 2023-10-02 ENCOUNTER — Other Ambulatory Visit (HOSPITAL_COMMUNITY): Payer: Self-pay

## 2023-10-16 ENCOUNTER — Other Ambulatory Visit: Payer: Self-pay | Admitting: Internal Medicine

## 2023-10-23 ENCOUNTER — Other Ambulatory Visit: Payer: Self-pay | Admitting: Internal Medicine

## 2023-10-23 DIAGNOSIS — E039 Hypothyroidism, unspecified: Secondary | ICD-10-CM

## 2023-10-30 ENCOUNTER — Other Ambulatory Visit: Payer: Self-pay | Admitting: "Endocrinology

## 2023-10-30 DIAGNOSIS — E119 Type 2 diabetes mellitus without complications: Secondary | ICD-10-CM

## 2023-11-27 ENCOUNTER — Other Ambulatory Visit: Payer: Self-pay | Admitting: "Endocrinology

## 2023-11-27 DIAGNOSIS — E669 Obesity, unspecified: Secondary | ICD-10-CM

## 2023-12-07 ENCOUNTER — Other Ambulatory Visit: Payer: Self-pay | Admitting: Internal Medicine

## 2023-12-07 DIAGNOSIS — I10 Essential (primary) hypertension: Secondary | ICD-10-CM

## 2023-12-26 ENCOUNTER — Ambulatory Visit: Payer: PPO | Admitting: "Endocrinology

## 2024-01-18 ENCOUNTER — Other Ambulatory Visit: Payer: Self-pay | Admitting: Internal Medicine

## 2024-01-18 DIAGNOSIS — E039 Hypothyroidism, unspecified: Secondary | ICD-10-CM

## 2024-01-21 ENCOUNTER — Emergency Department (HOSPITAL_COMMUNITY): Admission: EM | Admit: 2024-01-21 | Discharge: 2024-01-21 | Disposition: A | Discharge status: Home / Self Care

## 2024-01-21 ENCOUNTER — Other Ambulatory Visit: Payer: Self-pay

## 2024-01-21 ENCOUNTER — Emergency Department (HOSPITAL_COMMUNITY)

## 2024-01-21 ENCOUNTER — Encounter (HOSPITAL_COMMUNITY): Payer: Self-pay

## 2024-01-21 DIAGNOSIS — R42 Dizziness and giddiness: Secondary | ICD-10-CM | POA: Diagnosis not present

## 2024-01-21 DIAGNOSIS — Z7982 Long term (current) use of aspirin: Secondary | ICD-10-CM | POA: Insufficient documentation

## 2024-01-21 DIAGNOSIS — R112 Nausea with vomiting, unspecified: Secondary | ICD-10-CM | POA: Diagnosis not present

## 2024-01-21 DIAGNOSIS — E119 Type 2 diabetes mellitus without complications: Secondary | ICD-10-CM | POA: Diagnosis not present

## 2024-01-21 DIAGNOSIS — R197 Diarrhea, unspecified: Secondary | ICD-10-CM | POA: Insufficient documentation

## 2024-01-21 DIAGNOSIS — Z7984 Long term (current) use of oral hypoglycemic drugs: Secondary | ICD-10-CM | POA: Insufficient documentation

## 2024-01-21 DIAGNOSIS — N3289 Other specified disorders of bladder: Secondary | ICD-10-CM | POA: Diagnosis not present

## 2024-01-21 DIAGNOSIS — R109 Unspecified abdominal pain: Secondary | ICD-10-CM | POA: Diagnosis not present

## 2024-01-21 LAB — CBC WITH DIFFERENTIAL/PLATELET
Abs Immature Granulocytes: 0.04 10*3/uL (ref 0.00–0.07)
Basophils Absolute: 0.1 10*3/uL (ref 0.0–0.1)
Basophils Relative: 1 %
Eosinophils Absolute: 0 10*3/uL (ref 0.0–0.5)
Eosinophils Relative: 0 %
HCT: 51.5 % (ref 39.0–52.0)
Hemoglobin: 17 g/dL (ref 13.0–17.0)
Immature Granulocytes: 1 %
Lymphocytes Relative: 18 %
Lymphs Abs: 1.5 10*3/uL (ref 0.7–4.0)
MCH: 29.1 pg (ref 26.0–34.0)
MCHC: 33 g/dL (ref 30.0–36.0)
MCV: 88 fL (ref 80.0–100.0)
Monocytes Absolute: 0.7 10*3/uL (ref 0.1–1.0)
Monocytes Relative: 8 %
Neutro Abs: 6.1 10*3/uL (ref 1.7–7.7)
Neutrophils Relative %: 72 %
Platelets: 267 10*3/uL (ref 150–400)
RBC: 5.85 MIL/uL — ABNORMAL HIGH (ref 4.22–5.81)
RDW: 13.2 % (ref 11.5–15.5)
WBC: 8.4 10*3/uL (ref 4.0–10.5)
nRBC: 0 % (ref 0.0–0.2)

## 2024-01-21 LAB — URINALYSIS, ROUTINE W REFLEX MICROSCOPIC
Bacteria, UA: NONE SEEN
Bilirubin Urine: NEGATIVE
Glucose, UA: >=500 mg/dL — AB
Hgb urine dipstick: NEGATIVE
Ketones, ur: 20 mg/dL — AB
Leukocytes,Ua: NEGATIVE
Nitrite: NEGATIVE
Protein, ur: NEGATIVE mg/dL
Specific Gravity, Urine: 1.032 — ABNORMAL HIGH (ref 1.005–1.030)
pH: 5 (ref 5.0–8.0)

## 2024-01-21 LAB — COMPREHENSIVE METABOLIC PANEL WITH GFR
ALT: 13 U/L (ref 0–44)
AST: 17 U/L (ref 15–41)
Albumin: 4.5 g/dL (ref 3.5–5.0)
Alkaline Phosphatase: 58 U/L (ref 38–126)
Anion gap: 15 (ref 5–15)
BUN: 22 mg/dL (ref 8–23)
CO2: 27 mmol/L (ref 22–32)
Calcium: 9.9 mg/dL (ref 8.9–10.3)
Chloride: 94 mmol/L — ABNORMAL LOW (ref 98–111)
Creatinine, Ser: 0.91 mg/dL (ref 0.61–1.24)
GFR, Estimated: >60 mL/min (ref >60)
Glucose, Bld: 148 mg/dL — ABNORMAL HIGH (ref 70–99)
Potassium: 4.4 mmol/L (ref 3.5–5.1)
Sodium: 136 mmol/L (ref 135–145)
Total Bilirubin: 2.3 mg/dL — ABNORMAL HIGH (ref 0.0–1.2)
Total Protein: 7.5 g/dL (ref 6.5–8.1)

## 2024-01-21 LAB — LIPASE, BLOOD: Lipase: 26 U/L (ref 11–51)

## 2024-01-21 MED ORDER — SODIUM CHLORIDE 0.9 % IV BOLUS
500.0000 mL | Freq: Once | INTRAVENOUS | Status: AC
  Administered 2024-01-21: 500 mL via INTRAVENOUS

## 2024-01-21 MED ORDER — ONDANSETRON HCL 4 MG/2ML IJ SOLN
4.0000 mg | Freq: Once | INTRAMUSCULAR | Status: AC
  Administered 2024-01-21: 4 mg via INTRAVENOUS
  Filled 2024-01-21: qty 2

## 2024-01-21 MED ORDER — ONDANSETRON 4 MG PO TBDP: 4.0000 mg | ORAL_TABLET | Freq: Three times a day (TID) | ORAL | 0 refills | Status: AC | PRN

## 2024-01-21 MED ORDER — MECLIZINE HCL 12.5 MG PO TABS
25.0000 mg | ORAL_TABLET | Freq: Once | ORAL | Status: AC
  Administered 2024-01-21: 25 mg via ORAL
  Filled 2024-01-21: qty 2

## 2024-01-21 MED ORDER — PAROXETINE HCL 20 MG PO TABS: 20.0000 mg | ORAL_TABLET | Freq: Every day | ORAL | 0 refills | Status: DC

## 2024-01-21 MED ORDER — MECLIZINE HCL 25 MG PO TABS: 25.0000 mg | ORAL_TABLET | Freq: Three times a day (TID) | ORAL | 0 refills | Status: AC | PRN

## 2024-01-21 MED ORDER — IOHEXOL 300 MG/ML  SOLN
100.0000 mL | Freq: Once | INTRAMUSCULAR | Status: AC | PRN
  Administered 2024-01-21: 100 mL via INTRAVENOUS

## 2024-01-21 NOTE — Discharge Instructions (Signed)
 Take your Zofran  as needed for nausea and vomiting and your meclizine as needed for dizziness.  Follow-up with your primary care doctor.  Keep a bland diet for the next 24 hours and increase your fluid intake.  Return to the ER for any new or worsening symptoms.

## 2024-01-21 NOTE — ED Triage Notes (Signed)
 Pt arrived via POV from home c/o N/V since Thursday last week after Pt reports he ate at [facility]. Pt reports nausea worsens when he moves around. Pt denies Hx of Vertigo. Pt reports difficulty keeping any food or liquids down.

## 2024-01-21 NOTE — ED Provider Notes (Signed)
 [place] [facility]MENT AT [facility] Provider Note   CSN: [id number] Arrival date & time: 01/21/24  1719     Patient presents with: Emesis   Jim Salazar is a 73 y.o. male.   73 year old male presents for evaluation of nausea and vomiting and dizziness.  States has been going on since Friday.  States he ate at [facility] Thursday night and thought maybe he had food poisoning but states he is not feeling much improved.  Vomiting started Friday.  States he has been very dizzy as well which seems to get worse when he changes positions or gets up from laying down on the couch.  Admits to loose stools but no diarrhea.  Denies any abdominal pain, chest pain, shortness of breath, headaches, vision changes, or any other symptoms or concerns at this time.   Emesis Associated symptoms: diarrhea   Associated symptoms: no abdominal pain, no arthralgias, no chills, no cough, no fever and no sore throat        Prior to Admission medications   Medication Sig Start Date End Date Taking? Authorizing Provider  meclizine (ANTIVERT) 25 MG tablet Take 1 tablet (25 mg total) by mouth 3 (three) times daily as needed for up to 4 days for dizziness. 01/21/24 01/25/24 Yes Sie Formisano L, DO  ondansetron  (ZOFRAN -ODT) 4 MG disintegrating tablet Take 1 tablet (4 mg total) by mouth every 8 (eight) hours as needed for up to 4 days for nausea or vomiting. 01/21/24 01/25/24 Yes Joseff Luckman L, DO  amLODipine  (NORVASC ) 10 MG tablet Take 1 tablet (10 mg total) by mouth at bedtime. 12/07/23   Tobie Suzzane POUR, MD  aspirin  EC 81 MG tablet Take 81 mg by mouth every evening.    [provider]  atorvastatin  (LIPITOR) 10 MG tablet TAKE (1) TABLET BY MOUTH ONCE DAILY. 03/06/23   Tobie Suzzane POUR, MD  azelastine  (ASTELIN ) 0.1 % nasal spray Place 1 spray into both nostrils 2 (two) times daily. Use in each nostril as directed 11/01/21   Tobie Suzzane POUR, MD  Blood Glucose Monitoring Suppl (ONE TOUCH ULTRA 2)  w/Device KIT USE TO TEST BLOOD GLUCOSE ONCE DAILY AS DIRECTED 10/05/21   Nida, Gebreselassie W, MD  Cholecalciferol (VITAMIN D3) 125 MCG (5000 UT) CAPS Take 1 capsule (5,000 Units total) by mouth daily. 08/16/22   Lenis Ethelle ORN, MD  Continuous Glucose Receiver (FREESTYLE LIBRE 3 READER) DEVI 1 each by Does not apply route as directed. Use to check glucose as directed 12/26/22   Nida, Gebreselassie W, MD  Continuous Glucose Sensor (FREESTYLE LIBRE 3 SENSOR) MISC USE AS DIRECTED TO CHECK GLUCOSE CONTINUOUSLY, CHANGE SENSOR EVERY 14 DAYS. 10/30/23   Lenis Ethelle ORN, MD  empagliflozin  (JARDIANCE ) 10 MG TABS tablet Take 1 tablet (10 mg total) by mouth daily with breakfast. 09/10/23   Nida, Gebreselassie W, MD  glucose blood (ONETOUCH ULTRA) test strip Use as instructed 08/29/21   Nida, Gebreselassie W, MD  Lancets [facility] ULTRASOFT) lancets Test at least 2 times a day 08/29/21   Nida, Gebreselassie W, MD  levocetirizine (XYZAL ) 5 MG tablet Take 1 tablet (5 mg total) by mouth every evening. 07/31/22   Tobie Suzzane POUR, MD  levothyroxine  (SYNTHROID ) 125 MCG tablet TAKE (1) TABLET BY MOUTH ONCE DAILY. 01/18/24   Tobie Suzzane POUR, MD  lisinopril  (ZESTRIL ) 30 MG tablet Take 1 tablet (30 mg total) by mouth daily with breakfast. 06/04/23   Tobie Suzzane POUR, MD  lisinopril  (ZESTRIL ) 40 MG tablet  TAKE (1) TABLET BY MOUTH ONCE DAILY. 01/18/24   Tobie Suzzane POUR, MD  metFORMIN  (GLUCOPHAGE ) 1000 MG tablet TAKE 1 TABLET BY MOUTH TWO TIMES DAILY WITH A MEAL. 11/27/23   Lenis Ethelle ORN, MD  metFORMIN  (GLUCOPHAGE -XR) 500 MG 24 hr tablet Take 2 tablets (1,000 mg total) by mouth daily with breakfast. 08/28/23   Nida, Gebreselassie W, MD  PARoxetine  (PAXIL ) 20 MG tablet Take 1 tablet (20 mg total) by mouth daily. 01/21/24   Tobie Suzzane POUR, MD  promethazine -dextromethorphan (PROMETHAZINE -DM) 6.25-15 MG/5ML syrup Take 5 mLs by mouth 4 (four) times daily as needed for cough. 04/20/23   Tobie Suzzane POUR, MD    Allergies: Patient  has no known allergies.    Review of Systems  Constitutional:  Negative for chills and fever.  HENT:  Negative for ear pain and sore throat.   Eyes:  Negative for pain and visual disturbance.  Respiratory:  Negative for cough and shortness of breath.   Cardiovascular:  Negative for chest pain and palpitations.  Gastrointestinal:  Positive for diarrhea, nausea and vomiting. Negative for abdominal pain.  Genitourinary:  Negative for dysuria and hematuria.  Musculoskeletal:  Negative for arthralgias and back pain.  Skin:  Negative for color change and rash.  Neurological:  Positive for dizziness. Negative for seizures and syncope.  All other systems reviewed and are negative.   Updated Vital Signs BP (!) 158/96   Pulse 67   Temp (!) 97.4 F (36.3 C) (Temporal)   Resp 18   Ht 5' 11.5 (1.816 m)   Wt 85.7 kg   SpO2 97%   BMI 25.98 kg/m   Physical Exam Vitals and nursing note reviewed.  Constitutional:      General: He is not in acute distress.    Appearance: Normal appearance. He is well-developed. He is obese. He is not ill-appearing.  HENT:     Head: Normocephalic and atraumatic.   Eyes:     Conjunctiva/sclera: Conjunctivae normal.    Cardiovascular:     Rate and Rhythm: Normal rate and regular rhythm.     Heart sounds: No murmur heard. Pulmonary:     Effort: Pulmonary effort is normal. No respiratory distress.     Breath sounds: Normal breath sounds.  Abdominal:     General: There is no distension.     Palpations: Abdomen is soft. There is no mass.     Tenderness: There is no abdominal tenderness.     Hernia: No hernia is present.   Musculoskeletal:        General: No swelling.     Cervical back: Neck supple.   Skin:    General: Skin is warm and dry.     Capillary Refill: Capillary refill takes less than 2 seconds.   Neurological:     General: No focal deficit present.     Mental Status: He is alert and oriented to person, place, and time. Mental status is  at baseline.     Motor: No weakness.     Comments: Finger to nose test intact bilaterally, no nystagmus, no ataxia   Psychiatric:        Mood and Affect: Mood normal.     (all labs ordered are listed, but only abnormal results are displayed) Labs Reviewed  COMPREHENSIVE METABOLIC PANEL WITH GFR - Abnormal; Notable for the following components:      Result Value   Chloride 94 (*)    Glucose, Bld 148 (*)    Total Bilirubin 2.3 (*)  All other components within normal limits  URINALYSIS, ROUTINE W REFLEX MICROSCOPIC - Abnormal; Notable for the following components:   Specific Gravity, Urine 1.032 (*)    Glucose, UA >=500 (*)    Ketones, ur 20 (*)    All other components within normal limits  CBC WITH DIFFERENTIAL/PLATELET - Abnormal; Notable for the following components:   RBC 5.85 (*)    All other components within normal limits  LIPASE, BLOOD    EKG: None  Radiology: CT ABDOMEN PELVIS W CONTRAST Result Date: 01/21/2024 CLINICAL DATA:  Abdominal pain and vomiting EXAM: CT ABDOMEN AND PELVIS WITH CONTRAST TECHNIQUE: Multidetector CT imaging of the abdomen and pelvis was performed using the standard protocol following bolus administration of intravenous contrast. RADIATION DOSE REDUCTION: This exam was performed according to the departmental dose-optimization program which includes automated exposure control, adjustment of the mA and/or kV according to patient size and/or use of iterative reconstruction technique. CONTRAST:  100mL OMNIPAQUE IOHEXOL 300 MG/ML  SOLN COMPARISON:  None Available. FINDINGS: Lower chest: No acute abnormality. Hepatobiliary: No focal liver abnormality is seen. No gallstones, gallbladder wall thickening, or biliary dilatation. Pancreas: Unremarkable. No pancreatic ductal dilatation or surrounding inflammatory changes. Spleen: Normal in size without focal abnormality. Adrenals/Urinary Tract: Adrenal glands are within normal limits. Kidneys are well visualized  bilaterally. No renal calculi or obstructive changes are seen. Renal cysts are noted on the right simple in nature. No follow-up is recommended. The bladder is well distended. Stomach/Bowel: Appendix is not visualized consistent with a prior surgical history. No obstructive or inflammatory changes of the colon are seen. Small bowel and stomach are within normal limits. Vascular/Lymphatic: Aortic atherosclerosis. No enlarged abdominal or pelvic lymph nodes. Reproductive: Prostate is unremarkable. Other: Fat containing left inguinal hernia is noted. The left testicle appears somewhat high-riding. 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 Degenerative changes of lumbar spine are seen as well. IMPRESSION: No acute abnormality to correspond with given clinical history. Electronically Signed   By: Oneil Devonshire M.D.   On: 01/21/2024 20:27     Procedures   Medications Ordered in the ED  meclizine (ANTIVERT) tablet 25 mg (25 mg Oral Given 01/21/24 1917)  ondansetron  (ZOFRAN ) injection 4 mg (4 mg Intravenous Given 01/21/24 1916)  sodium chloride  0.9 % bolus 500 mL (0 mLs Intravenous Stopped 01/21/24 2010)  iohexol (OMNIPAQUE) 300 MG/ML solution 100 mL (100 mLs Intravenous Contrast Given 01/21/24 2007)                                    Medical Decision Making Medical Decision Making Nursing notes are reviewed. Differential diagnosis for this patient would include but not limited to: Gastroenteritis, foodborne illness, pancreatitis, Electra abnormality, dehydration, UTI, other  Records reviewed: Outpatient records reviewed and patient seen in office on 2-25 for management of his diabetes   Studies: Imaging reviewed and CT abdomen/pelvis shows no acute abnormality  Emergency Department Course:  Vital signs and pulse oximetry are reviewed, evaluated by myself and found to be within normal limits prior to final disposition. Findings of laboratory testing and medical  imaging are discussed with patient and family that is available. Patient agrees with the medical care plan as follows:  Patient's lab workup reviewed by me and unremarkable.  Feeling better after IV fluids.  I gave him Zofran  as well.  Will give him prescription for this as well as meclizine.  Vies increase  his fluid intake, keep a bland diet for the next 24 hours and follow-up with his primary care doctor.  Call the office to make an appointment otherwise return to the ER for any worsening symptoms.  All results and plan discussed with patient and family at bedside they are agreeable plan for discharge.  Problems Addressed: Nausea and vomiting, unspecified vomiting type: undiagnosed new problem with uncertain prognosis Vertigo: undiagnosed new problem with uncertain prognosis  Amount and/or Complexity of Data Reviewed Independent Historian: caregiver    Details: Family at bedside helps to provide history of patient's current course of symptoms  External Data Reviewed: notes. Labs: ordered. Decision-making details documented in ED Course. Radiology: ordered and independent interpretation performed. Decision-making details documented in ED Course.  Risk OTC drugs. Prescription drug management. Drug therapy requiring intensive monitoring for toxicity.     Final diagnoses:  Nausea and vomiting, unspecified vomiting type  Vertigo    ED Discharge Orders          Ordered    ondansetron  (ZOFRAN -ODT) 4 MG disintegrating tablet  Every 8 hours PRN        01/21/24 2050    meclizine (ANTIVERT) 25 MG tablet  3 times daily PRN        01/21/24 2050               Gennaro Bouchard L, DO 01/21/24 2213

## 2024-01-24 ENCOUNTER — Ambulatory Visit: Admitting: "Endocrinology

## 2024-02-07 ENCOUNTER — Ambulatory Visit: Admitting: "Endocrinology

## 2024-02-25 ENCOUNTER — Other Ambulatory Visit: Payer: Self-pay

## 2024-02-25 DIAGNOSIS — E1169 Type 2 diabetes mellitus with other specified complication: Secondary | ICD-10-CM

## 2024-02-25 MED ORDER — METFORMIN HCL 1000 MG PO TABS: 1000.0000 mg | ORAL_TABLET | Freq: Two times a day (BID) | ORAL | 0 refills | Status: AC

## 2024-03-04 ENCOUNTER — Ambulatory Visit: Admitting: "Endocrinology

## 2024-03-05 ENCOUNTER — Encounter: Payer: Self-pay | Admitting: "Endocrinology

## 2024-03-05 ENCOUNTER — Ambulatory Visit: Admitting: "Endocrinology

## 2024-03-05 VITALS — BP 144/68 | HR 60 | Ht 71.5 in | Wt 185.0 lb

## 2024-03-05 DIAGNOSIS — I10 Essential (primary) hypertension: Secondary | ICD-10-CM

## 2024-03-05 DIAGNOSIS — E782 Mixed hyperlipidemia: Secondary | ICD-10-CM

## 2024-03-05 DIAGNOSIS — E039 Hypothyroidism, unspecified: Secondary | ICD-10-CM | POA: Diagnosis not present

## 2024-03-05 DIAGNOSIS — Z7984 Long term (current) use of oral hypoglycemic drugs: Secondary | ICD-10-CM | POA: Diagnosis not present

## 2024-03-05 DIAGNOSIS — E119 Type 2 diabetes mellitus without complications: Secondary | ICD-10-CM

## 2024-03-05 LAB — POCT GLYCOSYLATED HEMOGLOBIN (HGB A1C): HbA1c, POC (controlled diabetic range): 6.7 % (ref 0.0–7.0)

## 2024-03-05 MED ORDER — LEVOTHYROXINE SODIUM 112 MCG PO TABS: 112.0000 ug | ORAL_TABLET | Freq: Every day | ORAL | 1 refills | Status: DC

## 2024-03-05 NOTE — Progress Notes (Signed)
 03/05/2024, 12:04 PM  Endocrinology follow-up note   Subjective:    Patient ID: Jim Salazar, male    DOB: 04-25-1951.  Jim Salazar is being seen in follow-up after he was seen in consultation for  management of currently uncontrolled symptomatic diabetes requested by  Tobie Suzzane POUR, MD.   Past Medical History:  Diagnosis Date   CAD (coronary artery disease), native coronary artery    mild nonobstructive plaque in LCx and RCA and mildly obstructive minimally calcified plaque in mid LAD on coronary CTA 11/2017   Depression    Diabetes mellitus without complication (HCC)    GERD (gastroesophageal reflux disease)    Hypertension    Hypothyroidism, unspecified    Left sided numbness 09/17/2017   Major depressive disorder, single episode, unspecified    Need for immunization against influenza 06/12/2019   Need for vaccination against Streptococcus pneumoniae using pneumococcal conjugate vaccine 13 06/12/2019   Obesity, unspecified    Right upper quadrant abdominal pain 09/22/2019   Screening for prostate cancer 06/12/2019   Syncope 10/17/2017   Thyroid  disease    Tremor, essential 11/30/2017   Type 2 diabetes mellitus with hyperglycemia (HCC)     Past Surgical History:  Procedure Laterality Date   APPENDECTOMY     COLONOSCOPY     HAND SURGERY Left 08/2022   TONSILLECTOMY      Social History   Socioeconomic History   Marital status: Married    Spouse name: [name]    Number of children: 2   Years of education: Automotive engineer   Highest education level: Master's degree (e.g., MA, MS, MEng, MEd, MSW, MBA)  Occupational History   Occupation: semi retired     Comment: Education officer, environmental   Tobacco Use   Smoking status: Never    Passive exposure: Never   Smokeless tobacco: Never  Vaping Use   Vaping status: Never Used  Substance and Sexual Activity   Alcohol use: No   Drug use: No   Sexual activity: Yes    Birth control/protection: None  Other Topics  Concern   Not on file  Social History Narrative   Lives with 2nd wife [name] -married 10 years      3 dogs: 2 inside and 1 outside: Skimp; Ginger; Eve      Enjoys: model railroad Proofreader, Tax adviser some      Diet: eats all food groups -raisins    Caffeine use: 1-2 cups decaf coffee daily, diet coke sometimes   Water: 4-5 bottles a day       Wears seat belt   Does not use phone while driving-hands free    Psychologist, sport and exercise at home   Weapons in lock box   Social Drivers of Health   Financial Resource Strain: Low Risk  (02/14/2023)   Overall Financial Resource Strain (CARDIA)    Difficulty of Paying Living Expenses: Not hard at all  Food Insecurity: No Food Insecurity (02/14/2023)   Hunger Vital Sign    Worried About Running Out of Food in the Last Year: Never true    Ran Out of Food in the Last Year: Never true  Transportation Needs: No Transportation Needs (02/14/2023)   PRAPARE - Administrator, Civil Service (Medical): No    Lack of Transportation (Non-Medical): No  Physical Activity: Sufficiently Active (02/14/2023)   Exercise Vital Sign    Days of Exercise per Week: 5 days    Minutes of Exercise per Session: 30 min  Stress: No Stress Concern Present (02/14/2023)   [employer] of Occupational Health - Occupational Stress Questionnaire    Feeling of Stress : Not at all  Social Connections: Socially Integrated (02/14/2023)   Social Connection and Isolation Panel    Frequency of Communication with Friends and Family: More than three times a week    Frequency of Social Gatherings with Friends and Family: More than three times a week    Attends Religious Services: More than 4 times per year    Active Member of [employer] or Organizations: Yes    Attends Engineer, structural: More than 4 times per year    Marital Status: Married    Family History  Problem Relation Age of Onset   Kidney disease Mother    Hypertension Mother    Thyroid  disease Mother     Diabetes Mellitus II Mother    Hyperlipidemia Mother    Stroke Mother    Hypertension Father    Cancer Father        lung   Hypertension Other    Colon cancer Neg Hx    Colon polyps Neg Hx    Esophageal cancer Neg Hx    Rectal cancer Neg Hx    Stomach cancer Neg Hx     Outpatient Encounter Medications as of 03/05/2024  Medication Sig   amLODipine  (NORVASC ) 10 MG tablet Take 1 tablet (10 mg total) by mouth at bedtime.   aspirin  EC 81 MG tablet Take 81 mg by mouth every evening.   atorvastatin  (LIPITOR) 10 MG tablet TAKE (1) TABLET BY MOUTH ONCE DAILY.   azelastine  (ASTELIN ) 0.1 % nasal spray Place 1 spray into both nostrils 2 (two) times daily. Use in each nostril as directed   Blood Glucose Monitoring Suppl (ONE TOUCH ULTRA 2) w/Device KIT USE TO TEST BLOOD GLUCOSE ONCE DAILY AS DIRECTED   Cholecalciferol (VITAMIN D3) 125 MCG (5000 UT) CAPS Take 1 capsule (5,000 Units total) by mouth daily.   Continuous Glucose Receiver (FREESTYLE LIBRE 3 READER) DEVI 1 each by Does not apply route as directed. Use to check glucose as directed   Continuous Glucose Sensor (FREESTYLE LIBRE 3 SENSOR) MISC USE AS DIRECTED TO CHECK GLUCOSE CONTINUOUSLY, CHANGE SENSOR EVERY 14 DAYS.   empagliflozin  (JARDIANCE ) 10 MG TABS tablet Take 1 tablet (10 mg total) by mouth daily with breakfast.   glucose blood (ONETOUCH ULTRA) test strip Use as instructed   Lancets (ONETOUCH ULTRASOFT) lancets Test at least 2 times a day   levocetirizine (XYZAL ) 5 MG tablet Take 1 tablet (5 mg total) by mouth every evening.   levothyroxine  (SYNTHROID ) 112 MCG tablet Take 1 tablet (112 mcg total) by mouth daily before breakfast.   lisinopril  (ZESTRIL ) 30 MG tablet Take 1 tablet (30 mg total) by mouth daily with breakfast.   lisinopril  (ZESTRIL ) 40 MG tablet TAKE (1) TABLET BY MOUTH ONCE DAILY.   metFORMIN  (GLUCOPHAGE ) 1000 MG tablet Take 1 tablet (1,000 mg total) by mouth 2 (two) times daily with a meal.   PARoxetine  (PAXIL ) 20 MG  tablet Take 1 tablet (20 mg total) by mouth daily.   promethazine -dextromethorphan (PROMETHAZINE -DM) 6.25-15 MG/5ML syrup Take 5 mLs by mouth 4 (four) times daily as needed for cough.   [DISCONTINUED] levothyroxine  (SYNTHROID ) 125 MCG tablet TAKE (1) TABLET BY MOUTH ONCE DAILY.   No facility-administered encounter medications on file as of 03/05/2024.    ALLERGIES: No Known Allergies  VACCINATION STATUS: Immunization History  Administered Date(s) Administered   Fluad  Quad(high Dose 65+) 04/29/2020, 04/26/2021, 05/09/2022   Influenza Inj Mdck Quad Pf 05/23/2016   Influenza Inj Mdck Quad With Preservative 05/24/2017   Influenza, High Dose Seasonal PF 05/25/2018   Influenza,inj,Quad PF,6+ Mos 06/12/2019   Influenza-Unspecified 08/04/2012, 04/22/2013, 05/24/2017   Janssen (J&J) SARS-COV-2 Vaccination 10/17/2019   Pneumococcal Conjugate-13 06/12/2019   Pneumococcal Polysaccharide-23 07/20/2017    Diabetes He presents for his follow-up diabetic visit. He has type 2 diabetes mellitus. Onset time: He was diagnosed at approximate age of 67 years. His disease course has been improving. There are no hypoglycemic associated symptoms. Pertinent negatives for hypoglycemia include no confusion, headaches, pallor or seizures. Pertinent negatives for diabetes include no chest pain, no fatigue, no polydipsia, no polyphagia, no polyuria and no weakness. There are no hypoglycemic complications. Symptoms are improving. There are no diabetic complications. Risk factors for coronary artery disease include diabetes mellitus, dyslipidemia, family history, hypertension, sedentary lifestyle and male sex. Current diabetic treatment includes oral agent (monotherapy) (He is currently taking metformin  1000 mg p.o. twice daily, and Jardiance  10 mg p.o. daily at breakfast.). His weight is decreasing steadily. He is following a generally healthy diet. When asked about meal planning, he reported none. He has had a previous  visit with a dietitian. He participates in exercise intermittently. His home blood glucose trend is decreasing steadily. His breakfast blood glucose range is generally 130-140 mg/dl. His lunch blood glucose range is generally 130-140 mg/dl. His dinner blood glucose range is generally 130-140 mg/dl. His bedtime blood glucose range is generally 130-140 mg/dl. His overall blood glucose range is 130-140 mg/dl. (Jim Salazar wears a CGM device at this time.  He presents with his device showing AGP report of 95% time in range, 5% level 1 hyperglycemia.  Has no hypoglycemia.  His average blood glucose 135 mg per DL for the most recent 2 weeks.  His point-of-care A1c is 6.7% improving from 7.3% during his last visit.    he is engaged in lifestyle medicine, maintaining the weight loss he has achieved prior to his last visit.  ) An ACE inhibitor/angiotensin II receptor blocker is being taken. Eye exam is current.  Hyperlipidemia This is a chronic problem. The current episode started more than 1 year ago. Exacerbating diseases include diabetes. Pertinent negatives include no chest pain, myalgias or shortness of breath. Current antihyperlipidemic treatment includes statins and diet change. Risk factors for coronary artery disease include dyslipidemia, diabetes mellitus, male sex and family history.  Hypertension This is a chronic problem. The current episode started more than 1 year ago. Pertinent negatives include no chest pain, headaches, neck pain, palpitations or shortness of breath. Risk factors for coronary artery disease include dyslipidemia, diabetes mellitus, family history, male gender and sedentary lifestyle. Past treatments include ACE inhibitors.     Objective:       03/05/2024    9:54 AM 01/21/2024    8:30 PM 01/21/2024    8:00 PM  Vitals with BMI  Height 5' 11.5    Weight 185 lbs    BMI 25.45    Systolic 144 158 845  Diastolic 68 96 78  Pulse 60 67 67    BP (!) 144/68   Pulse 60   Ht 5' 11.5  (1.816 m)   Wt 185 lb (83.9 kg)   BMI 25.44 kg/m   Wt Readings from Last 3 Encounters:  03/05/24 185 lb (83.9 kg)  01/21/24 188 lb 15 oz (85.7 kg)  08/28/23 189 lb (85.7 kg)  On physical exam, he has mild tremors of outstretched hand arms/hands.  CMP ( most recent) CMP     Component Value Date/Time   NA 136 01/21/2024 1840   NA 141 08/20/2023 0953   K 4.4 01/21/2024 1840   CL 94 (L) 01/21/2024 1840   CO2 27 01/21/2024 1840   GLUCOSE 148 (H) 01/21/2024 1840   BUN 22 01/21/2024 1840   BUN 14 08/20/2023 0953   CREATININE 0.91 01/21/2024 1840   CREATININE 1.02 06/22/2020 0847   CALCIUM  9.9 01/21/2024 1840   PROT 7.5 01/21/2024 1840   PROT 6.6 08/20/2023 0953   ALBUMIN 4.5 01/21/2024 1840   ALBUMIN 4.5 08/20/2023 0953   AST 17 01/21/2024 1840   ALT 13 01/21/2024 1840   ALKPHOS 58 01/21/2024 1840   BILITOT 2.3 (H) 01/21/2024 1840   BILITOT 1.4 (H) 08/20/2023 0953   GFRNONAA >60 01/21/2024 1840   GFRNONAA 76 06/22/2020 0847   GFRAA 88 06/22/2020 0847     Diabetic Labs (most recent): Lab Results  Component Value Date   HGBA1C 6.7 03/05/2024   HGBA1C 7.3 (H) 08/20/2023   HGBA1C 6.5 02/21/2023   MICROALBUR 10 04/04/2022     Lipid Panel ( most recent) Lipid Panel     Component Value Date/Time   CHOL 124 08/20/2023 0953   TRIG 68 08/20/2023 0953   HDL 54 08/20/2023 0953   CHOLHDL 2.3 08/20/2023 0953   CHOLHDL 3.0 09/23/2019 0746   VLDL 25 09/17/2017 0843   LDLCALC 56 08/20/2023 0953   LDLCALC 47 09/23/2019 0746   LABVLDL 14 08/20/2023 0953      Lab Results  Component Value Date   TSH 0.654 08/20/2023   TSH 0.682 08/20/2023   TSH 3.090 08/14/2022   TSH 0.650 12/22/2021   TSH 4.980 (H) 04/26/2021   TSH 0.322 (L) 04/09/2021   TSH 1.240 09/22/2020   TSH 7.26 (H) 09/23/2019   TSH 2.643 09/17/2017   FREET4 1.80 (H) 08/20/2023   FREET4 1.81 (H) 08/20/2023   FREET4 1.41 08/14/2022   FREET4 1.74 12/22/2021   FREET4 1.39 04/26/2021   FREET4 1.40  09/22/2020      Assessment & Plan:   1. Poorly controlled type 2 diabetes mellitus (HCC)  - Jim Salazar has currently uncontrolled symptomatic type 2 DM since  73 years of age.  Jim Salazar wears a CGM device at this time.  He presents with his device showing AGP report of 95% time in range, 5% level 1 hyperglycemia.  Has no hypoglycemia.  His average blood glucose 135 mg per DL for the most recent 2 weeks.  His point-of-care A1c is 6.7% improving from 7.3% during his last visit.    He is engaged in lifestyle medicine, maintaining the weight loss he has achieved prior to his last visit.    - I had a long discussion with him about the progressive nature of diabetes and the pathology behind its complications. -He does not report gross complications from diabetes, however, he remains at a high risk for more acute and chronic complications which include CAD, CVA, CKD, retinopathy, and neuropathy. These are all discussed in detail with him.  - I have counseled him on diet  and weight management  by adopting a carbohydrate restricted/protein rich diet. Patient is encouraged to switch to  unprocessed or minimally processed     complex starch and increased protein intake (animal or plant source), fruits, and vegetables. -  he is advised to stick to a routine  mealtimes to eat 3 meals  a day and avoid unnecessary snacks ( to snack only to correct hypoglycemia).  In light of his metabolic dysfunction with multiple chronic conditions , he remains a good candidate for lifestyle medicine. -He is encouraged to stay engaged in lifestyle medicine.  - he acknowledges that there is a room for improvement in his food and drink choices. - Suggestion is made for him to avoid simple carbohydrates  from his diet including Cakes, Sweet Desserts, Ice Cream, Soda (diet and regular), Sweet Tea, Candies, Chips, Cookies, Store Bought Juices, Alcohol , Artificial Sweeteners,  Coffee Creamer, and Sugar-free Products,  Lemonade. This will help patient to have more stable blood glucose profile and potentially avoid unintended weight gain.  The following Lifestyle Medicine recommendations according to American College of Lifestyle Medicine  [facility]) were discussed and and offered to patient and he  agrees to start the journey:  A. Whole Foods, Plant-Based Nutrition comprising of fruits and vegetables, plant-based proteins, whole-grain carbohydrates was discussed in detail with the patient.   A list for source of those nutrients were also provided to the patient.  Patient will use only water or unsweetened tea for hydration. B.  The need to stay away from risky substances including alcohol, smoking; obtaining 7 to 9 hours of restorative sleep, at least 150 minutes of moderate intensity exercise weekly, the importance of healthy social connections,  and stress management techniques were discussed. C.  A full color page of  Calorie density of various food groups per pound showing examples of each food groups was provided to the patient.   - I have approached him with the following individualized plan to manage  his diabetes and patient agrees:   - In light of his presentation with target glycemic profile, he will not need further escalation on medications.  He is advised to continue his current regimen including metformin  1000 mg p.o. twice daily and Jardiance  10 mg p.o. daily at breakfast.   -He did not tolerate GLP-1 receptor agonist in the past.   -He is advised to continue to use his CGM.   He is advised to call clinic for blood glucose readings greater than 180 milligrams per DL weekly average.   - Specific targets for  A1c;  LDL, HDL,  and Triglycerides were discussed with the patient.  2) Blood Pressure /Hypertension:  His blood pressure is not controlled to target.  He is currently on Norvasc  10 mg p.o. daily and lisinopril  40 mg p.o. daily at breakfast.  He is advised to take both of his medications at  breakfast .   3) Lipids/Hyperlipidemia:   Review of his recent lipid panel showed controlled LDL at 56.  This is considered target control.  He is advised to continue his atorvastatin  10 mg p.o. nightly. Side effects and precautions discussed with him.  4)  Weight/Diet:  Body mass index is 25.44 kg/m.  -    This patient has achieved adequate weight loss with lifestyle medicine.  he is not a candidate for major weight loss.   I discussed with him the fact that loss of 5 - 10% of his  current body weight will have the most impact on his diabetes management.  Exercise, and detailed carbohydrates information provided  -  detailed on discharge instructions.  5) hypothyroidism-the circumstance of his diagnosis are not available to review.  His most recent thyroid  function test profile is consistent with slight over replacement.  I discussed and lowered his  levothyroxine  to 112 mcg p.o. daily before breakfast.   - We discussed about the correct intake of his thyroid  hormone, on empty stomach at fasting, with water, separated by at least 30 minutes from breakfast and other medications,  and separated by more than 4 hours from calcium , iron, multivitamins, acid reflux medications (PPIs). -Patient is made aware of the fact that thyroid  hormone replacement is needed for life, dose to be adjusted by periodic monitoring of thyroid  function tests.   6) Chronic Care/Health Maintenance:  -he  is on ACEI/ARB and Statin medications and  is encouraged to initiate and continue to follow up with Ophthalmology, Dentist,  Podiatrist at least yearly or according to recommendations, and advised to   stay away from smoking. I have recommended yearly flu vaccine and pneumonia vaccine at least every 5 years; moderate intensity exercise for up to 150 minutes weekly; and  sleep for at least 7 hours a day.  - he is  advised to maintain close follow up with Tobie Suzzane POUR, MD for primary care needs, as well as his other  providers for optimal and coordinated care.   I spent  26  minutes in the care of the patient today including review of labs from CMP, Lipids, Thyroid  Function, Hematology (current and previous including abstractions from other facilities); face-to-face time discussing  his blood glucose readings/logs, discussing hypoglycemia and hyperglycemia episodes and symptoms, medications doses, his options of short and long term treatment based on the latest standards of care / guidelines;  discussion about incorporating lifestyle medicine;  and documenting the encounter. Risk reduction counseling performed per USPSTF guidelines to reduce  cardiovascular risk factors.     Please refer to Patient Instructions for Blood Glucose Monitoring and Insulin /Medications Dosing Guide  in media tab for additional information. Please  also refer to  Patient Self Inventory in the Media  tab for reviewed elements of pertinent patient history.  Jim Salazar participated in the discussions, expressed understanding, and voiced agreement with the above plans.  All questions were answered to his satisfaction. he is encouraged to contact clinic should he have any questions or concerns prior to his return visit.   Follow up plan: - Return in about 4 months (around 07/05/2024) for F/U with Pre-visit Labs, Meter/CGM/Logs, A1c here.  Jim Earl, MD [facility] [address] [place], [place] [address] Phone: [phone]  Fax: [phone]    03/05/2024, 12:04 PM  This note was partially dictated with voice recognition software. Similar sounding words can be transcribed inadequately or may not  be corrected upon review.

## 2024-03-10 ENCOUNTER — Other Ambulatory Visit: Payer: Self-pay | Admitting: "Endocrinology

## 2024-03-18 ENCOUNTER — Ambulatory Visit (INDEPENDENT_AMBULATORY_CARE_PROVIDER_SITE_OTHER)

## 2024-03-18 VITALS — BP 130/63 | Ht 71.5 in | Wt 185.0 lb

## 2024-03-18 DIAGNOSIS — Z0000 Encounter for general adult medical examination without abnormal findings: Secondary | ICD-10-CM | POA: Diagnosis not present

## 2024-03-18 NOTE — Progress Notes (Signed)
 Subjective:   Jim Salazar is a 73 y.o. who presents for a Medicare Wellness preventive visit.  As a reminder, Annual Wellness Visits don't include a physical exam, and some assessments may be limited, especially if this visit is performed virtually. We may recommend an in-person follow-up visit with your provider if needed.  Visit Complete: Virtual I connected with  Dorn VEAR Boas on 03/18/24 by a audio enabled telemedicine application and verified that I am speaking with the correct person using two identifiers.  Patient Location: Home  Provider Location: Home Office  I discussed the limitations of evaluation and management by telemedicine. The patient expressed understanding and agreed to proceed.  Vital Signs: Because this visit was a virtual/telehealth visit, some criteria may be missing or patient reported. Any vitals not documented were not able to be obtained and vitals that have been documented are patient reported.  VideoDeclined- This patient declined Librarian, academic. Therefore the visit was completed with audio only.  Persons Participating in Visit: Patient.  AWV Questionnaire: No: Patient Medicare AWV questionnaire was not completed prior to this visit.  Cardiac Risk Factors include: advanced age (>22men, >73 women);diabetes mellitus;dyslipidemia;hypertension;male gender     Objective:    Today's Vitals   03/18/24 1550  BP: 130/63  Weight: 185 lb (83.9 kg)  Height: 5' 11.5 (1.816 m)   Body mass index is 25.44 kg/m.     03/18/2024    3:52 PM 01/21/2024    5:34 PM 02/14/2023    8:00 AM 01/16/2022    8:25 AM 04/09/2021    7:17 PM 01/10/2021    2:26 PM 06/28/2020   10:15 AM  Advanced Directives  Does Patient Have a Medical Advance Directive? No No No No No No No  Would patient like information on creating a medical advance directive? No - Patient declined No - Patient declined No - Patient declined Yes (ED - Information included  in AVS) No - Patient declined No - Patient declined No - Patient declined    Current Medications (verified) Outpatient Encounter Medications as of 03/18/2024  Medication Sig   amLODipine  (NORVASC ) 10 MG tablet Take 1 tablet (10 mg total) by mouth at bedtime.   aspirin  EC 81 MG tablet Take 81 mg by mouth every evening.   atorvastatin  (LIPITOR) 10 MG tablet TAKE (1) TABLET BY MOUTH ONCE DAILY.   azelastine  (ASTELIN ) 0.1 % nasal spray Place 1 spray into both nostrils 2 (two) times daily. Use in each nostril as directed   Blood Glucose Monitoring Suppl (ONE TOUCH ULTRA 2) w/Device KIT USE TO TEST BLOOD GLUCOSE ONCE DAILY AS DIRECTED   Cholecalciferol (VITAMIN D3) 125 MCG (5000 UT) CAPS Take 1 capsule (5,000 Units total) by mouth daily.   Continuous Glucose Receiver (FREESTYLE LIBRE 3 READER) DEVI 1 each by Does not apply route as directed. Use to check glucose as directed   Continuous Glucose Sensor (FREESTYLE LIBRE 3 SENSOR) MISC USE AS DIRECTED TO CHECK GLUCOSE CONTINUOUSLY, CHANGE SENSOR EVERY 14 DAYS.   glucose blood (ONETOUCH ULTRA) test strip Use as instructed   JARDIANCE  10 MG TABS tablet Take 1 tablet (10 mg total) by mouth daily with breakfast.   Lancets (ONETOUCH ULTRASOFT) lancets Test at least 2 times a day   levocetirizine (XYZAL ) 5 MG tablet Take 1 tablet (5 mg total) by mouth every evening.   levothyroxine  (SYNTHROID ) 112 MCG tablet Take 1 tablet (112 mcg total) by mouth daily before breakfast.   lisinopril  (ZESTRIL ) 40  MG tablet TAKE (1) TABLET BY MOUTH ONCE DAILY.   metFORMIN  (GLUCOPHAGE ) 1000 MG tablet Take 1 tablet (1,000 mg total) by mouth 2 (two) times daily with a meal.   PARoxetine  (PAXIL ) 20 MG tablet Take 1 tablet (20 mg total) by mouth daily.   [DISCONTINUED] promethazine -dextromethorphan (PROMETHAZINE -DM) 6.25-15 MG/5ML syrup Take 5 mLs by mouth 4 (four) times daily as needed for cough.   No facility-administered encounter medications on file as of 03/18/2024.     Allergies (verified) Patient has no known allergies.   History: Past Medical History:  Diagnosis Date   CAD (coronary artery disease), native coronary artery    mild nonobstructive plaque in LCx and RCA and mildly obstructive minimally calcified plaque in mid LAD on coronary CTA 11/2017   Depression    Diabetes mellitus without complication (HCC)    GERD (gastroesophageal reflux disease)    Hypertension    Hypothyroidism, unspecified    Left sided numbness 09/17/2017   Major depressive disorder, single episode, unspecified    Need for immunization against influenza 06/12/2019   Need for vaccination against Streptococcus pneumoniae using pneumococcal conjugate vaccine 13 06/12/2019   Obesity, unspecified    Right upper quadrant abdominal pain 09/22/2019   Screening for prostate cancer 06/12/2019   Syncope 10/17/2017   Thyroid  disease    Tremor, essential 11/30/2017   Type 2 diabetes mellitus with hyperglycemia (HCC)    Past Surgical History:  Procedure Laterality Date   APPENDECTOMY     COLONOSCOPY     HAND SURGERY Left 08/2022   TONSILLECTOMY     Family History  Problem Relation Age of Onset   Kidney disease Mother    Hypertension Mother    Thyroid  disease Mother    Diabetes Mellitus II Mother    Hyperlipidemia Mother    Stroke Mother    Hypertension Father    Cancer Father        lung   Hypertension Other    Colon cancer Neg Hx    Colon polyps Neg Hx    Esophageal cancer Neg Hx    Rectal cancer Neg Hx    Stomach cancer Neg Hx    Social History   Socioeconomic History   Marital status: Married    Spouse name: [name]    Number of children: 2   Years of education: Automotive engineer   Highest education level: Master's degree (e.g., MA, MS, MEng, MEd, MSW, MBA)  Occupational History   Occupation: semi retired     Comment: Education officer, environmental   Tobacco Use   Smoking status: Never    Passive exposure: Never   Smokeless tobacco: Never  Vaping Use   Vaping status: Never Used   Substance and Sexual Activity   Alcohol use: No   Drug use: No   Sexual activity: Yes    Birth control/protection: None  Other Topics Concern   Not on file  Social History Narrative   Lives with 2nd wife [name] -married 10 years      3 dogs: 2 inside and 1 outside: Skimp; Ginger; Eve      Enjoys: model railroad Proofreader, Tax adviser some      Diet: eats all food groups -raisins    Caffeine use: 1-2 cups decaf coffee daily, diet coke sometimes   Water: 4-5 bottles a day       Wears seat belt   Does not use phone while driving-hands free    Smoke detectors at home   Weapons in lock box  Social Drivers of Corporate investment banker Strain: Low Risk  (03/18/2024)   Overall Financial Resource Strain (CARDIA)    Difficulty of Paying Living Expenses: Not hard at all  Food Insecurity: No Food Insecurity (03/18/2024)   Hunger Vital Sign    Worried About Running Out of Food in the Last Year: Never true    Ran Out of Food in the Last Year: Never true  Transportation Needs: No Transportation Needs (03/18/2024)   PRAPARE - Administrator, Civil Service (Medical): No    Lack of Transportation (Non-Medical): No  Physical Activity: Sufficiently Active (03/18/2024)   Exercise Vital Sign    Days of Exercise per Week: 7 days    Minutes of Exercise per Session: 30 min  Stress: No Stress Concern Present (03/18/2024)   [employer] of Occupational Health - Occupational Stress Questionnaire    Feeling of Stress: Not at all  Social Connections: Socially Integrated (03/18/2024)   Social Connection and Isolation Panel    Frequency of Communication with Friends and Family: More than three times a week    Frequency of Social Gatherings with Friends and Family: More than three times a week    Attends Religious Services: More than 4 times per year    Active Member of [employer] or Organizations: Yes    Attends Engineer, structural: More than 4 times per year    Marital Status: Married     Tobacco Counseling Counseling given: Yes    Clinical Intake:  Pre-visit preparation completed: Yes  Pain : No/denies pain     BMI - recorded: 25.44 Nutritional Status: BMI 25 -29 Overweight Diabetes: Yes CBG done?: No (telehealth visit.) Did pt. bring in CBG monitor from home?: No  Lab Results  Component Value Date   HGBA1C 6.7 03/05/2024   HGBA1C 7.3 (H) 08/20/2023   HGBA1C 6.5 02/21/2023     How often do you need to have someone help you when you read instructions, pamphlets, or other written materials from your doctor or pharmacy?: 1 - Never  Interpreter Needed?: No  Information entered by :: Genita Nilsson W CMA (AAMA)   Activities of Daily Living     03/18/2024    3:52 PM  In your present state of health, do you have any difficulty performing the following activities:  Hearing? 0  Vision? 0  Difficulty concentrating or making decisions? 0  Walking or climbing stairs? 0  Dressing or bathing? 0  Doing errands, shopping? 0  Preparing Food and eating ? N  Using the Toilet? N  In the past six months, have you accidently leaked urine? N  Do you have problems with loss of bowel control? N  Managing your Medications? N  Managing your Finances? N  Housekeeping or managing your Housekeeping? N    Patient Care Team: Tobie Suzzane POUR, MD as PCP - General (Internal Medicine) Shlomo Wilbert SAUNDERS, MD as Consulting Physician (Cardiology)  I have updated your Care Teams any recent Medical Services you may have received from other providers in the past year.     Assessment:   This is a routine wellness examination for Esten.  Hearing/Vision screen Hearing Screening - Comments:: Patient denies any hearing difficulties.   Vision Screening - Comments:: Wears rx glasses - up to date with routine eye exams with  Dr. Oneil Kawasaki with [facility] [place]   Goals Addressed               This Visit's  Progress     Exercise 150 min/wk Moderate Activity   On track      Glycemic Management Optimized   On track     Evidence-based guidance:   Anticipate A1C testing (point-of-care) every 3 to 6 months based on goal attainment.  Review mutually-set A1C goal or target range.  Anticipate screening for thyroid  dysfunction, adrenal dysfunction and celiac disease based on presenting signs/symptoms.  Anticipate use of insulin , multidose or continuous subcutaneous infusion with periodic adjustments; consider active involvement of pharmacist.  Explore child and caregiver fear of hypoglycemic episodes that may impact adherence to treatment plan, such as withholding insulin  and allowing blood sugars to be ?oa little? high.  Provide medical nutrition therapy and development of individualized eating plan.  Compare child or caregiver reported symptoms of hypo or hyperglycemia to blood glucose levels, diet and fluid intake, current medications, psychosocial and physiologic stressors, change in activity and barriers to care adherence.  Promote self-monitoring of blood glucose levels, interval or continuous.  Assess and address barriers regarding adherence to treatment and self-management plan, including patient factors, such as family cohesion, age, developmental ability, depression, anxiety, fear of hypoglycemia or weight gain, as   well as medication cost, side effects and complicated regimen.  Encourage developmentally-appropriate balance between dependence and independence in care, especially when intensifying treatment, such as continuous blood glucose monitoring or the use of an insulin  pump.  Consider referral to community-based diabetes education program, visiting nurse, community health worker or health coach.  Initiate or review diabetes medical management plan for school that may include storage of insulin  and clean, private location for insulin  administration or blood glucose monitoring.    Notes:       COMPLETED: HEMOGLOBIN A1C < 7        Patient Stated  (pt-stated)   On track     Stay active       Depression Screen     03/18/2024    3:59 PM 05/15/2023    8:06 AM 04/20/2023    9:05 AM 02/14/2023    8:02 AM 11/13/2022    8:56 AM 05/09/2022    8:08 AM 01/16/2022    8:15 AM  PHQ 2/9 Scores  PHQ - 2 Score 0 0 0 0 0 0 0  PHQ- 9 Score 0 2         Fall Risk     03/18/2024    3:56 PM 05/15/2023    8:06 AM 04/20/2023    9:05 AM 02/14/2023    8:00 AM 11/13/2022    8:56 AM  Fall Risk   Falls in the past year? 0 0 0 0 0  Number falls in past yr: 0 0 0 0 0  Injury with Fall? 0 0 0 0 0  Risk for fall due to : No Fall Risks No Fall Risks  No Fall Risks   Follow up Falls evaluation completed;Education provided;Falls prevention discussed Falls evaluation completed  Falls prevention discussed     MEDICARE RISK AT HOME:  Medicare Risk at Home Any stairs in or around the home?: Yes If so, are there any without handrails?: No Home free of loose throw rugs in walkways, pet beds, electrical cords, etc?: Yes Adequate lighting in your home to reduce risk of falls?: Yes Life alert?: No Use of a cane, walker or w/c?: No Grab bars in the bathroom?: Yes Shower chair or bench in shower?: No Elevated toilet seat or a handicapped toilet?: Yes  TIMED UP AND GO:  Was the test performed?  No  Cognitive Function: 6CIT completed        03/18/2024    3:59 PM 02/14/2023    8:01 AM 01/16/2022    8:28 AM 06/28/2020   10:20 AM  6CIT Screen  What Year? 0 points 0 points 0 points 0 points  What month? 0 points 0 points 0 points 0 points  What time? 0 points 0 points 0 points 0 points  Count back from 20 0 points 0 points 0 points 0 points  Months in reverse 0 points 0 points 0 points 0 points  Repeat phrase 0 points 0 points 0 points 0 points  Total Score 0 points 0 points 0 points 0 points    Immunizations Immunization History  Administered Date(s) Administered   Fluad Quad(high Dose 65+) 04/29/2020, 04/26/2021, 05/09/2022   INFLUENZA, HIGH DOSE  SEASONAL PF 05/25/2018   Influenza Inj Mdck Quad Pf 05/23/2016   Influenza Inj Mdck Quad With Preservative 05/24/2017   Influenza,inj,Quad PF,6+ Mos 06/12/2019   Influenza-Unspecified 08/04/2012, 04/22/2013, 05/24/2017   Janssen (J&J) SARS-COV-2 Vaccination 10/17/2019   Pneumococcal Conjugate-13 06/12/2019   Pneumococcal Polysaccharide-23 07/20/2017    Screening Tests Health Maintenance  Topic Date Due   DTaP/Tdap/Td (1 - Tdap) Never done   Zoster Vaccines- Shingrix (1 of 2) Never done   COVID-19 Vaccine (2 - 2024-25 season) 03/25/2023   OPHTHALMOLOGY EXAM  04/26/2023   Medicare Annual Wellness (AWV)  02/14/2024   INFLUENZA VACCINE  02/22/2024   Pneumococcal Vaccine: 50+ Years (3 of 3 - PCV20 or PCV21) 06/11/2024   Diabetic kidney evaluation - Urine ACR  05/14/2024   FOOT EXAM  05/14/2024   HEMOGLOBIN A1C  09/05/2024   Diabetic kidney evaluation - eGFR measurement  01/20/2025   Colonoscopy  03/21/2028   Hepatitis C Screening  Completed   HPV VACCINES  Aged Out   Meningococcal B Vaccine  Aged Out    Health Maintenance  Health Maintenance Due  Topic Date Due   DTaP/Tdap/Td (1 - Tdap) Never done   Zoster Vaccines- Shingrix (1 of 2) Never done   COVID-19 Vaccine (2 - 2024-25 season) 03/25/2023   OPHTHALMOLOGY EXAM  04/26/2023   Medicare Annual Wellness (AWV)  02/14/2024   INFLUENZA VACCINE  02/22/2024   Pneumococcal Vaccine: 50+ Years (3 of 3 - PCV20 or PCV21) 06/11/2024   Health Maintenance Items Addressed: Routine health screenings are up to date. Patient is aware of recommended vaccines.   Additional Screening:  Vision Screening: Recommended annual ophthalmology exams for early detection of glaucoma and other disorders of the eye. Would you like a referral to an eye doctor? No    Dental Screening: Recommended annual dental exams for proper oral hygiene  Community Resource Referral / Chronic Care Management: CRR required this visit?  No   CCM required this visit?   No   Plan:    I have personally reviewed and noted the following in the patient's chart:   Medical and social history Use of alcohol, tobacco or illicit drugs  Current medications and supplements including opioid prescriptions. Patient is not currently taking opioid prescriptions. Functional ability and status Nutritional status Physical activity Advanced directives List of other physicians Hospitalizations, surgeries, and ER visits in previous 12 months Vitals Screenings to include cognitive, depression, and falls Referrals and appointments  In addition, I have reviewed and discussed with patient certain preventive protocols, quality metrics, and best practice recommendations. A written personalized care plan for preventive services as well as  general preventive health recommendations were provided to patient.   Mandel Seiden, CMA   03/18/2024   After Visit Summary: (MyChart) Due to this being a telephonic visit, the after visit summary with patients personalized plan was offered to patient via MyChart   Notes: Nothing significant to report at this time.

## 2024-03-18 NOTE — Patient Instructions (Signed)
 Mr. Jim Salazar , Thank you for taking time out of your busy schedule to complete your Annual Wellness Visit with me. I enjoyed our conversation and look forward to speaking with you again next year. I, as well as your care team,  appreciate your ongoing commitment to your health goals. Please review the following plan we discussed and let me know if I can assist you in the future.  Your Game plan/ To Do List   Follow up Visits: We will see or speak with you next year for your Next Medicare AWV with our clinical staff   Clinician Recommendations:  Aim for 30 minutes of exercise or brisk walking, 6-8 glasses of water, and 5 servings of fruits and vegetables each day.    Wishing you many blessings and good health during the next year until our next visit.  -Cataleya Cristina   This is a list of the screenings recommended for you:  Health Maintenance  Topic Date Due   DTaP/Tdap/Td vaccine (1 - Tdap) Never done   Zoster (Shingles) Vaccine (1 of 2) Never done   COVID-19 Vaccine (2 - 2024-25 season) 03/25/2023   Eye exam for diabetics  04/26/2023   Medicare Annual Wellness Visit  02/14/2024   Flu Shot  02/22/2024   Pneumococcal Vaccine for age over 37 (3 of 3 - PCV20 or PCV21) 06/11/2024   Yearly kidney health urinalysis for diabetes  05/14/2024   Complete foot exam   05/14/2024   Hemoglobin A1C  09/05/2024   Yearly kidney function blood test for diabetes  01/20/2025   Colon Cancer Screening  03/21/2028   Hepatitis C Screening  Completed   HPV Vaccine  Aged Out   Meningitis B Vaccine  Aged Out    Advanced directives: (Declined) Advance directive discussed with you today. Even though you declined this today, please call our office should you change your mind, and we can give you the proper paperwork for you to fill out. Advance Care Planning is important because it:  [x]  Makes sure you receive the medical care that is consistent with your values, goals, and preferences  [x]  It provides guidance to your  family and loved ones and reduces their decisional burden about whether or not they are making the right decisions based on your wishes.  Follow the link provided in your after visit summary or read over the paperwork we have mailed to you to help you started getting your Advance Directives in place. If you need assistance in completing these, please reach out to us  so that we can help you!  See attachments for Preventive Care and Fall Prevention Tips.

## 2024-03-24 ENCOUNTER — Other Ambulatory Visit: Payer: Self-pay | Admitting: Internal Medicine

## 2024-03-24 DIAGNOSIS — E782 Mixed hyperlipidemia: Secondary | ICD-10-CM

## 2024-03-26 ENCOUNTER — Other Ambulatory Visit: Payer: Self-pay | Admitting: Internal Medicine

## 2024-03-26 DIAGNOSIS — I10 Essential (primary) hypertension: Secondary | ICD-10-CM

## 2024-04-07 ENCOUNTER — Ambulatory Visit
Admission: EM | Admit: 2024-04-07 | Discharge: 2024-04-07 | Disposition: A | Discharge status: Home / Self Care | Attending: Family Medicine | Admitting: Family Medicine

## 2024-04-07 ENCOUNTER — Other Ambulatory Visit: Payer: Self-pay

## 2024-04-07 DIAGNOSIS — U071 COVID-19: Secondary | ICD-10-CM | POA: Diagnosis not present

## 2024-04-07 LAB — POC SOFIA SARS ANTIGEN FIA: SARS Coronavirus 2 Ag: POSITIVE — AB

## 2024-04-07 MED ORDER — BENZONATATE 200 MG PO CAPS: 200.0000 mg | ORAL_CAPSULE | Freq: Three times a day (TID) | ORAL | 0 refills | Status: AC | PRN

## 2024-04-07 MED ORDER — AZELASTINE HCL 0.1 % NA SOLN: 1.0000 | Freq: Two times a day (BID) | NASAL | 0 refills | Status: AC

## 2024-04-07 MED ORDER — PAXLOVID (300/100) 20 X 150 MG & 10 X 100MG PO TBPK: 3.0000 | ORAL_TABLET | Freq: Two times a day (BID) | ORAL | 0 refills | Status: AC

## 2024-04-07 NOTE — Discharge Instructions (Signed)
 In addition to the prescribed medications, you may take Coricidin HBP, plain Mucinex, Flonase  nasal spray, use saline sinus rinses, Tylenol .  Follow-up for worsening or unresolving symptoms.

## 2024-04-07 NOTE — ED Provider Notes (Signed)
 [facility]    CSN: [id number] Arrival date & time: 04/07/24  1002      History   Chief Complaint Chief Complaint  Patient presents with   Cough   Nasal Congestion   Headache    [name] is a 73 y.o. male.   Patient presenting today with 2-day history of congestion, headache, cough, fatigue, body aches.  Denies fever, chest pain, shortness of breath, abdominal pain, vomiting, diarrhea.  Trying Motrin  with minimal relief.  States wife was diagnosed with COVID yesterday.  Denies history of chronic pulmonary disease.    Past Medical History:  Diagnosis Date   CAD (coronary artery disease), native coronary artery    mild nonobstructive plaque in LCx and RCA and mildly obstructive minimally calcified plaque in mid LAD on coronary CTA 11/2017   Depression    Diabetes mellitus without complication (HCC)    GERD (gastroesophageal reflux disease)    Hypertension    Hypothyroidism, unspecified    Left sided numbness 09/17/2017   Major depressive disorder, single episode, unspecified    Need for immunization against influenza 06/12/2019   Need for vaccination against Streptococcus pneumoniae using pneumococcal conjugate vaccine 13 06/12/2019   Obesity, unspecified    Right upper quadrant abdominal pain 09/22/2019   Screening for prostate cancer 06/12/2019   Syncope 10/17/2017   Thyroid  disease    Tremor, essential 11/30/2017   Type 2 diabetes mellitus with hyperglycemia [facility])     Patient Active Problem List   Diagnosis Date Noted   Acute recurrent frontal sinusitis 05/15/2023   Long term (current) use of oral hypoglycemic drugs 02/21/2023   Acute pain of left shoulder 11/17/2022   Vitamin D  deficiency 08/16/2022   Encounter for general adult medical examination with abnormal findings 05/09/2022   Allergic sinusitis 11/01/2021   Mixed hyperlipidemia 05/13/2021   Overweight with body mass index (BMI) of 28 to 28.9 in adult 06/24/2020   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 Authorizing Provider  azelastine  (ASTELIN ) 0.1 % nasal spray Place 1 spray into both nostrils 2 (two) times daily. Use in each nostril as directed 04/07/24  Yes Stuart Vernell Norris, PA-C  benzonatate  (TESSALON ) 200 MG capsule Take 1 capsule (200 mg total) by mouth 3 (three) times daily as needed for cough. 04/07/24  Yes Stuart Vernell Norris, PA-C  nirmatrelvir /ritonavir  (PAXLOVID , 300/100,) 20 x 150 MG & 10 x 100MG  TBPK Take 3 tablets by mouth 2 (two) times daily for 5 days. Patient GFR is >60. Take nirmatrelvir  (150 mg) two tablets twice daily for 5 days and ritonavir  (100 mg) one tablet twice daily for 5 days. 04/07/24 04/12/24 Yes Stuart Vernell Norris, PA-C  amLODipine  (NORVASC ) 10 MG tablet TAKE ONE TABLET BY MOUTH AT BEDTIME. 03/26/24   Tobie Suzzane POUR, MD  aspirin  EC 81 MG tablet Take 81 mg by mouth every evening.    [provider]  atorvastatin  (LIPITOR) 10 MG tablet TAKE (1) TABLET BY MOUTH ONCE DAILY. 03/06/23   Tobie Suzzane POUR, MD  azelastine  (ASTELIN ) 0.1 % nasal spray Place 1 spray into both nostrils 2 (two) times daily. Use in each nostril as directed 11/01/21   Tobie Suzzane POUR, MD  Blood Glucose Monitoring Suppl (ONE TOUCH ULTRA 2) w/Device KIT USE TO TEST BLOOD GLUCOSE ONCE DAILY AS DIRECTED 10/05/21   Nida, Gebreselassie W, MD  Cholecalciferol (VITAMIN D3) 125 MCG (5000 UT) CAPS Take 1 capsule (5,000  Units total) by mouth daily. 08/16/22   Lenis Ethelle ORN, MD  Continuous Glucose Receiver (FREESTYLE LIBRE 3 READER) DEVI 1 each by Does not apply route as directed. Use to check glucose as directed 12/26/22   Nida, Gebreselassie W, MD  Continuous Glucose Sensor (FREESTYLE LIBRE 3 SENSOR) MISC USE AS DIRECTED TO CHECK GLUCOSE CONTINUOUSLY, CHANGE SENSOR EVERY 14 DAYS. 10/30/23   Nida, Gebreselassie W, MD  glucose blood (ONETOUCH ULTRA) test strip Use as instructed 08/29/21   Nida, Gebreselassie W, MD  JARDIANCE  10 MG TABS tablet Take 1 tablet (10 mg total) by mouth daily with breakfast. 03/11/24   Nida, Gebreselassie W, MD  Lancets [facility] ULTRASOFT) lancets Test at least 2 times a day 08/29/21   Nida, Gebreselassie W, MD  levocetirizine (XYZAL ) 5 MG tablet Take 1 tablet (5 mg total) by mouth every evening. 07/31/22   Tobie Suzzane POUR, MD  levothyroxine  (SYNTHROID ) 112 MCG tablet Take 1 tablet (112 mcg total) by mouth daily before breakfast. 03/05/24   Nida, Gebreselassie W, MD  lisinopril  (ZESTRIL ) 40 MG tablet TAKE (1) TABLET BY MOUTH ONCE DAILY. 01/18/24   Tobie Suzzane POUR, MD  metFORMIN  (GLUCOPHAGE ) 1000 MG tablet Take 1 tablet (1,000 mg total) by mouth 2 (two) times daily with a meal. 02/25/24   Nida, Ethelle ORN, MD  PARoxetine  (PAXIL ) 20 MG tablet Take 1 tablet (20 mg total) by mouth daily. 01/21/24   Tobie Suzzane POUR, MD    Family History Family History  Problem Relation Age of Onset   Kidney disease Mother    Hypertension Mother    Thyroid  disease Mother    Diabetes Mellitus II Mother    Hyperlipidemia Mother    Stroke Mother    Hypertension Father    Cancer Father        lung   Hypertension Other    Colon cancer Neg Hx    Colon polyps Neg Hx    Esophageal cancer Neg Hx    Rectal cancer Neg Hx    Stomach cancer Neg Hx     Social History Social History   Tobacco Use   Smoking status: Never    Passive exposure: Never   Smokeless tobacco: Never  Vaping Use   Vaping status: Never  Used  Substance Use Topics   Alcohol use: No   Drug use: No     Allergies   Patient has no known allergies.   Review of Systems Review of Systems Per HPI  Physical Exam Triage Vital Signs ED Triage Vitals  Encounter Vitals Group     BP 04/07/24 1048 (!) 144/82     Girls Systolic BP Percentile --      Girls Diastolic BP Percentile --      Boys Systolic BP Percentile --      Boys Diastolic BP Percentile --      Pulse Rate 04/07/24 1048 78     Resp 04/07/24 1048 18     Temp 04/07/24  1048 98 F (36.7 C)     Temp Source 04/07/24 1048 Oral     SpO2 04/07/24 1048 96 %     Weight --      Height --      Head Circumference --      Peak Flow --      Pain Score 04/07/24 1044 2     Pain Loc --      Pain Education --      Exclude from Growth Chart --    No data found.  Updated Vital Signs BP (!) 144/82 (BP Location: Right Arm)   Pulse 78   Temp 98 F (36.7 C) (Oral)   Resp 18   SpO2 96%   Visual Acuity Right Eye Distance:   Left Eye Distance:   Bilateral Distance:    Right Eye Near:   Left Eye Near:    Bilateral Near:     Physical Exam Vitals and nursing note reviewed.  Constitutional:      Appearance: He is well-developed.  HENT:     Head: Atraumatic.     Right Ear: Tympanic membrane and external ear normal.     Left Ear: Tympanic membrane and external ear normal.     Nose: Rhinorrhea present.     Mouth/Throat:     Pharynx: Posterior oropharyngeal erythema present. No oropharyngeal exudate.  Eyes:     Conjunctiva/sclera: Conjunctivae normal.     Pupils: Pupils are equal, round, and reactive to light.  Cardiovascular:     Rate and Rhythm: Normal rate and regular rhythm.  Pulmonary:     Effort: Pulmonary effort is normal. No respiratory distress.     Breath sounds: No wheezing or rales.  Musculoskeletal:        General: Normal range of motion.     Cervical back: Normal range of motion and neck supple.  Lymphadenopathy:     Cervical: No cervical  adenopathy.  Skin:    General: Skin is warm and dry.  Neurological:     Mental Status: He is alert and oriented to person, place, and time.  Psychiatric:        Behavior: Behavior normal.      UC Treatments / Results  Labs (all labs ordered are listed, but only abnormal results are displayed) Labs Reviewed  POC SOFIA SARS ANTIGEN FIA - Abnormal; Notable for the following components:      Result Value   SARS Coronavirus 2 Ag Positive (*)    All other components within normal limits    EKG   Radiology No results found.  Procedures Procedures (including critical care time)  Medications Ordered in UC Medications - No data to display  Initial Impression / Assessment and Plan / UC Course  I have reviewed the triage vital signs and the nursing notes.  Pertinent labs & imaging results that were available during my care of the patient were reviewed by me and considered in my medical decision making (see chart for details).     Vitals and exam reassuring, rapid COVID-positive.  Will treat with Paxlovid , Astelin , Tessalon , supportive over-the-counter medications and home care.  Return precautions reviewed.  Final Clinical Impressions(s) / UC Diagnoses   Final diagnoses:  COVID-19     Discharge Instructions      In addition to the prescribed medications, you may take Coricidin HBP, plain Mucinex, Flonase  nasal spray, use saline sinus rinses, Tylenol .  Follow-up for worsening or unresolving symptoms.    ED Prescriptions  Medication Sig Dispense Auth. Provider   nirmatrelvir /ritonavir  (PAXLOVID , 300/100,) 20 x 150 MG & 10 x 100MG  TBPK Take 3 tablets by mouth 2 (two) times daily for 5 days. Patient GFR is >60. Take nirmatrelvir  (150 mg) two tablets twice daily for 5 days and ritonavir  (100 mg) one tablet twice daily for 5 days. 30 tablet Stuart Vernell Norris, PA-C   azelastine  (ASTELIN ) 0.1 % nasal spray Place 1 spray into both nostrils 2 (two) times daily. Use in  each nostril as directed 30 mL Stuart Vernell Norris, PA-C   benzonatate  (TESSALON ) 200 MG capsule Take 1 capsule (200 mg total) by mouth 3 (three) times daily as needed for cough. 20 capsule Stuart Vernell Norris, [place]      PDMP not reviewed this encounter.   Stuart Vernell Norris, PA-C 04/07/24 1155

## 2024-04-07 NOTE — ED Triage Notes (Signed)
 Pt being seen in UC for sinus congestion, headache, cough since Saturday,. Pt reports wife was dx with covid yesterday, pt wanting to be checked for covid. Pt denies fevers, CP, SOB, N/V. Pt reports taking motrin  yesterday.

## 2024-04-08 ENCOUNTER — Ambulatory Visit: Payer: Self-pay | Admitting: Internal Medicine

## 2024-04-18 ENCOUNTER — Other Ambulatory Visit: Payer: Self-pay | Admitting: "Endocrinology

## 2024-04-18 DIAGNOSIS — E119 Type 2 diabetes mellitus without complications: Secondary | ICD-10-CM

## 2024-04-23 ENCOUNTER — Telehealth: Payer: Self-pay

## 2024-04-23 ENCOUNTER — Other Ambulatory Visit: Payer: Self-pay

## 2024-04-23 DIAGNOSIS — E039 Hypothyroidism, unspecified: Secondary | ICD-10-CM

## 2024-04-23 MED ORDER — LEVOTHYROXINE SODIUM 112 MCG PO TABS: 112.0000 ug | ORAL_TABLET | Freq: Every day | ORAL | 1 refills | Status: AC

## 2024-04-23 MED ORDER — EMPAGLIFLOZIN 10 MG PO TABS: 10.0000 mg | ORAL_TABLET | Freq: Every day | ORAL | 1 refills | Status: DC

## 2024-04-23 MED ORDER — PAROXETINE HCL 20 MG PO TABS: 20.0000 mg | ORAL_TABLET | Freq: Every day | ORAL | 0 refills | Status: DC

## 2024-04-23 MED ORDER — LISINOPRIL 40 MG PO TABS: 40.0000 mg | ORAL_TABLET | Freq: Every day | ORAL | 3 refills | Status: AC

## 2024-04-23 NOTE — Telephone Encounter (Signed)
 Spoke to pt refilled requested medications.

## 2024-04-23 NOTE — Telephone Encounter (Signed)
 Copied from CRM [id number]. Topic: Clinical - Medication Question >> Apr 23, 2024  9:15 AM Anairis L wrote: Reason for CRM: Pharmacy faxed in Monday 04/21/2024 3 medication refills request. Pt is driving and can't confirm name of medication.

## 2024-05-05 DIAGNOSIS — E119 Type 2 diabetes mellitus without complications: Secondary | ICD-10-CM | POA: Diagnosis not present

## 2024-05-15 ENCOUNTER — Other Ambulatory Visit: Payer: Self-pay | Admitting: Internal Medicine

## 2024-05-15 DIAGNOSIS — E782 Mixed hyperlipidemia: Secondary | ICD-10-CM

## 2024-07-11 ENCOUNTER — Ambulatory Visit: Admitting: "Endocrinology

## 2024-07-11 NOTE — Progress Notes (Signed)
 Jim Salazar                                          MRN: [id number]   07/11/2024   The [facility] Quality Team Specialist reviewed this patient medical record for the purposes of chart review for care gap closure. The following were reviewed: chart review for care gap closure-kidney health evaluation for diabetes:uACR.    [facility]BCI Quality Team

## 2024-07-22 ENCOUNTER — Other Ambulatory Visit: Payer: Self-pay

## 2024-07-22 MED ORDER — PAROXETINE HCL 20 MG PO TABS: 20.0000 mg | ORAL_TABLET | Freq: Every day | ORAL | 0 refills | Status: AC

## 2024-07-24 LAB — LIPID PANEL
Chol/HDL Ratio: 2.3 ratio (ref 0.0–5.0)
Cholesterol, Total: 147 mg/dL (ref 100–199)
HDL: 64 mg/dL
LDL Chol Calc (NIH): 69 mg/dL (ref 0–99)
Triglycerides: 71 mg/dL (ref 0–149)
VLDL Cholesterol Cal: 14 mg/dL (ref 5–40)

## 2024-07-24 LAB — COMPREHENSIVE METABOLIC PANEL WITH GFR
ALT: 8 IU/L (ref 0–44)
AST: 12 IU/L (ref 0–40)
Albumin: 4.6 g/dL (ref 3.8–4.8)
Alkaline Phosphatase: 79 IU/L (ref 47–123)
BUN/Creatinine Ratio: 16 (ref 10–24)
BUN: 18 mg/dL (ref 8–27)
Bilirubin Total: 1.2 mg/dL (ref 0.0–1.2)
CO2: 25 mmol/L (ref 20–29)
Calcium: 9.4 mg/dL (ref 8.6–10.2)
Chloride: 100 mmol/L (ref 96–106)
Creatinine, Ser: 1.11 mg/dL (ref 0.76–1.27)
Globulin, Total: 2 g/dL (ref 1.5–4.5)
Glucose: 142 mg/dL — ABNORMAL HIGH (ref 70–99)
Potassium: 4.4 mmol/L (ref 3.5–5.2)
Sodium: 138 mmol/L (ref 134–144)
Total Protein: 6.6 g/dL (ref 6.0–8.5)
eGFR: 71 mL/min/1.73

## 2024-07-24 LAB — T4, FREE: Free T4: 1.4 ng/dL (ref 0.82–1.77)

## 2024-07-24 LAB — FIB-4 W/REFLEX TO ELF
FIB-4 Index: 1.2 (ref 0.00–2.67)
Platelets: 250 x10E3/uL (ref 150–450)

## 2024-07-24 LAB — TSH: TSH: 4.71 u[IU]/mL — ABNORMAL HIGH (ref 0.450–4.500)

## 2024-07-29 ENCOUNTER — Encounter: Payer: Self-pay | Admitting: "Endocrinology

## 2024-07-29 ENCOUNTER — Ambulatory Visit: Admitting: "Endocrinology

## 2024-07-29 VITALS — BP 132/74 | HR 68 | Ht 71.5 in | Wt 184.0 lb

## 2024-07-29 DIAGNOSIS — E119 Type 2 diabetes mellitus without complications: Secondary | ICD-10-CM

## 2024-07-29 DIAGNOSIS — Z7984 Long term (current) use of oral hypoglycemic drugs: Secondary | ICD-10-CM

## 2024-07-29 DIAGNOSIS — E782 Mixed hyperlipidemia: Secondary | ICD-10-CM

## 2024-07-29 DIAGNOSIS — E039 Hypothyroidism, unspecified: Secondary | ICD-10-CM | POA: Diagnosis not present

## 2024-07-29 DIAGNOSIS — I10 Essential (primary) hypertension: Secondary | ICD-10-CM

## 2024-07-29 LAB — POCT UA - MICROALBUMIN
Albumin/Creatinine Ratio, Urine, POC: <30
Creatinine, POC: 200 mg/dL
Microalbumin Ur, POC: 30 mg/L

## 2024-07-29 LAB — POCT GLYCOSYLATED HEMOGLOBIN (HGB A1C): HbA1c, POC (controlled diabetic range): 7.4 % — AB (ref 0.0–7.0)

## 2024-07-29 MED ORDER — EMPAGLIFLOZIN 25 MG PO TABS: 25.0000 mg | ORAL_TABLET | Freq: Every day | ORAL | 1 refills | Status: AC

## 2024-07-29 NOTE — Progress Notes (Signed)
 "                        07/29/2024, 11:04 AM  Endocrinology follow-up note   Subjective:    Patient ID: Jim Salazar, male    DOB: October 18, 1950.  TARIUS STANGELO is being seen in follow-up after he was seen in consultation for  management of currently uncontrolled symptomatic diabetes requested by  Tobie Suzzane POUR, MD.   Past Medical History:  Diagnosis Date   CAD (coronary artery disease), native coronary artery    mild nonobstructive plaque in LCx and RCA and mildly obstructive minimally calcified plaque in mid LAD on coronary CTA 11/2017   Depression    Diabetes mellitus without complication (HCC)    GERD (gastroesophageal reflux disease)    Hypertension    Hypothyroidism, unspecified    Left sided numbness 09/17/2017   Major depressive disorder, single episode, unspecified    Need for immunization against influenza 06/12/2019   Need for vaccination against Streptococcus pneumoniae using pneumococcal conjugate vaccine 13 06/12/2019   Obesity, unspecified    Right upper quadrant abdominal pain 09/22/2019   Screening for prostate cancer 06/12/2019   Syncope 10/17/2017   Thyroid  disease    Tremor, essential 11/30/2017   Type 2 diabetes mellitus with hyperglycemia (HCC)     Past Surgical History:  Procedure Laterality Date   APPENDECTOMY     COLONOSCOPY     HAND SURGERY Left 08/2022   TONSILLECTOMY      Social History   Socioeconomic History   Marital status: Married    Spouse name: [name]    Number of children: 2   Years of education: Automotive Engineer   Highest education level: Master's degree (e.g., MA, MS, MEng, MEd, MSW, MBA)  Occupational History   Occupation: semi retired     Comment: Education Officer, Environmental   Tobacco Use   Smoking status: Never    Passive exposure: Never   Smokeless tobacco: Never  Vaping Use   Vaping status: Never Used  Substance and Sexual Activity   Alcohol use: No   Drug use: No   Sexual activity: Yes    Birth control/protection: None  Other Topics Concern    Not on file  Social History Narrative   Lives with 2nd wife [name] -married 10 years      3 dogs: 2 inside and 1 outside: Skimp; Ginger; Eve      Enjoys: model railroad proofreader, tax adviser some      Diet: eats all food groups -raisins    Caffeine use: 1-2 cups decaf coffee daily, diet coke sometimes   Water: 4-5 bottles a day       Wears seat belt   Does not use phone while driving-hands free    Smoke detectors at home   Weapons in lock box   Social Drivers of Health   Tobacco Use: Low Risk (07/29/2024)   Patient History    Smoking Tobacco Use: Never    Smokeless Tobacco Use: Never    Passive Exposure: Never  Financial Resource Strain: Low Risk (03/18/2024)   Overall Financial Resource Strain (CARDIA)    Difficulty of Paying Living Expenses: Not hard at all  Food Insecurity: No Food Insecurity (03/18/2024)   Epic    Worried About Radiation Protection Practitioner of Food in the Last Year: Never true    Ran Out of Food in the Last Year: Never true  Transportation Needs: No Transportation Needs (03/18/2024)   Epic  Lack of Transportation (Medical): No    Lack of Transportation (Non-Medical): No  Physical Activity: Sufficiently Active (03/18/2024)   Exercise Vital Sign    Days of Exercise per Week: 7 days    Minutes of Exercise per Session: 30 min  Stress: No Stress Concern Present (03/18/2024)   [employer] of Occupational Health - Occupational Stress Questionnaire    Feeling of Stress: Not at all  Social Connections: Socially Integrated (03/18/2024)   Social Connection and Isolation Panel    Frequency of Communication with Friends and Family: More than three times a week    Frequency of Social Gatherings with Friends and Family: More than three times a week    Attends Religious Services: More than 4 times per year    Active Member of Clubs or Organizations: Yes    Attends Banker Meetings: More than 4 times per year    Marital Status: Married  Depression (PHQ2-9): Low Risk  (03/18/2024)   Depression (PHQ2-9)    PHQ-2 Score: 0  Alcohol Screen: Low Risk (03/18/2024)   Alcohol Screen    Last Alcohol Screening Score (AUDIT): 0  Housing: Low Risk (03/18/2024)   Epic    Unable to Pay for Housing in the Last Year: No    Number of Times Moved in the Last Year: 0    Homeless in the Last Year: No  Utilities: Not At Risk (03/18/2024)   Epic    Threatened with loss of utilities: No  Health Literacy: Adequate Health Literacy (03/18/2024)   B1300 Health Literacy    Frequency of need for help with medical instructions: Never    Family History  Problem Relation Age of Onset   Kidney disease Mother    Hypertension Mother    Thyroid  disease Mother    Diabetes Mellitus II Mother    Hyperlipidemia Mother    Stroke Mother    Hypertension Father    Cancer Father        lung   Hypertension Other    Colon cancer Neg Hx    Colon polyps Neg Hx    Esophageal cancer Neg Hx    Rectal cancer Neg Hx    Stomach cancer Neg Hx     Outpatient Encounter Medications as of 07/29/2024  Medication Sig   amLODipine  (NORVASC ) 10 MG tablet TAKE ONE TABLET BY MOUTH AT BEDTIME.   aspirin  EC 81 MG tablet Take 81 mg by mouth every evening.   atorvastatin  (LIPITOR) 10 MG tablet TAKE (1) TABLET BY MOUTH ONCE DAILY.   azelastine  (ASTELIN ) 0.1 % nasal spray Place 1 spray into both nostrils 2 (two) times daily. Use in each nostril as directed   azelastine  (ASTELIN ) 0.1 % nasal spray Place 1 spray into both nostrils 2 (two) times daily. Use in each nostril as directed   benzonatate  (TESSALON ) 200 MG capsule Take 1 capsule (200 mg total) by mouth 3 (three) times daily as needed for cough.   Blood Glucose Monitoring Suppl (ONE TOUCH ULTRA 2) w/Device KIT USE TO TEST BLOOD GLUCOSE ONCE DAILY AS DIRECTED   Cholecalciferol (VITAMIN D3) 125 MCG (5000 UT) CAPS Take 1 capsule (5,000 Units total) by mouth daily.   Continuous Glucose Receiver (FREESTYLE LIBRE 3 READER) DEVI 1 each by Does not apply route as  directed. Use to check glucose as directed   Continuous Glucose Sensor (FREESTYLE LIBRE 3 SENSOR) MISC USE AS DIRECTED TO CHECK GLUCOSE CONTINUOUSLY, CHANGE SENSOR EVERY 14 DAYS..   empagliflozin  (JARDIANCE ) 25 MG  TABS tablet Take 1 tablet (25 mg total) by mouth daily with breakfast.   glucose blood (ONETOUCH ULTRA) test strip Use as instructed   Lancets (ONETOUCH ULTRASOFT) lancets Test at least 2 times a day   levocetirizine (XYZAL ) 5 MG tablet Take 1 tablet (5 mg total) by mouth every evening.   levothyroxine  (SYNTHROID ) 112 MCG tablet Take 1 tablet (112 mcg total) by mouth daily before breakfast.   lisinopril  (ZESTRIL ) 40 MG tablet Take 1 tablet (40 mg total) by mouth daily.   metFORMIN  (GLUCOPHAGE ) 1000 MG tablet Take 1 tablet (1,000 mg total) by mouth 2 (two) times daily with a meal.   PARoxetine  (PAXIL ) 20 MG tablet Take 1 tablet (20 mg total) by mouth daily.   [DISCONTINUED] empagliflozin  (JARDIANCE ) 10 MG TABS tablet Take 1 tablet (10 mg total) by mouth daily with breakfast.   No facility-administered encounter medications on file as of 07/29/2024.    ALLERGIES: No Known Allergies  VACCINATION STATUS: Immunization History  Administered Date(s) Administered   Fluad Quad(high Dose 65+) 04/29/2020, 04/26/2021, 05/09/2022   INFLUENZA, HIGH DOSE SEASONAL PF 05/25/2018   Influenza Inj Mdck Quad Pf 05/23/2016   Influenza Inj Mdck Quad With Preservative 05/24/2017   Influenza,inj,Quad PF,6+ Mos 06/12/2019   Influenza-Unspecified 08/04/2012, 04/22/2013, 05/24/2017   Janssen (J&J) SARS-COV-2 Vaccination 10/17/2019   Pneumococcal Conjugate-13 06/12/2019   Pneumococcal Polysaccharide-23 07/20/2017    Diabetes He presents for his follow-up diabetic visit. He has type 2 diabetes mellitus. Onset time: He was diagnosed at approximate age of 89 years. His disease course has been worsening. There are no hypoglycemic associated symptoms. Pertinent negatives for hypoglycemia include no confusion,  headaches, pallor or seizures. Pertinent negatives for diabetes include no chest pain, no fatigue, no polydipsia, no polyphagia, no polyuria and no weakness. There are no hypoglycemic complications. Symptoms are worsening. There are no diabetic complications. Risk factors for coronary artery disease include diabetes mellitus, dyslipidemia, family history, hypertension, sedentary lifestyle and male sex. Current diabetic treatment includes oral agent (monotherapy) (He is currently taking metformin  1000 mg p.o. twice daily, and Jardiance  10 mg p.o. daily at breakfast.). His weight is stable. He is following a generally healthy diet. When asked about meal planning, he reported none. He has had a previous visit with a dietitian. He participates in exercise intermittently. His home blood glucose trend is increasing steadily. His breakfast blood glucose range is generally 140-180 mg/dl. His lunch blood glucose range is generally 140-180 mg/dl. His dinner blood glucose range is generally 140-180 mg/dl. His bedtime blood glucose range is generally 140-180 mg/dl. His overall blood glucose range is 140-180 mg/dl. (Mr. Gift wears a CGM device AGP shows 84% time in range, 15% level 1 hyperglycemia.  He has no hypoglycemia.  His point-of-care A1c is 7.4% increasing from 6.7% during his last visit.  ) An ACE inhibitor/angiotensin II receptor blocker is being taken. Eye exam is current.  Hyperlipidemia This is a chronic problem. The current episode started more than 1 year ago. Exacerbating diseases include diabetes. Pertinent negatives include no chest pain, myalgias or shortness of breath. Current antihyperlipidemic treatment includes statins and diet change. Risk factors for coronary artery disease include dyslipidemia, diabetes mellitus, male sex and family history.  Hypertension This is a chronic problem. The current episode started more than 1 year ago. Pertinent negatives include no chest pain, headaches, neck pain,  palpitations or shortness of breath. Risk factors for coronary artery disease include dyslipidemia, diabetes mellitus, family history, male gender and sedentary lifestyle. Past  treatments include ACE inhibitors.     Objective:       07/29/2024    8:53 AM 04/07/2024   10:48 AM 03/18/2024    3:50 PM  Vitals with BMI  Height 5' 11.5  5' 11.5  Weight 184 lbs  185 lbs  BMI 25.31  25.45  Systolic 132 144 869  Diastolic 74 82 63  Pulse 68 78     BP 132/74   Pulse 68   Ht 5' 11.5 (1.816 m)   Wt 184 lb (83.5 kg)   BMI 25.31 kg/m   Wt Readings from Last 3 Encounters:  07/29/24 184 lb (83.5 kg)  03/18/24 185 lb (83.9 kg)  03/05/24 185 lb (83.9 kg)     On physical exam, he has mild tremors of outstretched hand arms/hands.  CMP ( most recent) CMP     Component Value Date/Time   NA 138 07/23/2024 0930   K 4.4 07/23/2024 0930   CL 100 07/23/2024 0930   CO2 25 07/23/2024 0930   GLUCOSE 142 (H) 07/23/2024 0930   GLUCOSE 148 (H) 01/21/2024 1840   BUN 18 07/23/2024 0930   CREATININE 1.11 07/23/2024 0930   CREATININE 1.02 06/22/2020 0847   CALCIUM  9.4 07/23/2024 0930   PROT 6.6 07/23/2024 0930   ALBUMIN 4.6 07/23/2024 0930   AST 12 07/23/2024 0930   ALT 8 07/23/2024 0930   ALKPHOS 79 07/23/2024 0930   BILITOT 1.2 07/23/2024 0930   GFRNONAA >60 01/21/2024 1840   GFRNONAA 76 06/22/2020 0847   GFRAA 88 06/22/2020 0847     Diabetic Labs (most recent): Lab Results  Component Value Date   HGBA1C 7.4 (A) 07/29/2024   HGBA1C 6.7 03/05/2024   HGBA1C 7.3 (H) 08/20/2023   MICROALBUR 30 07/29/2024   MICROALBUR 10 04/04/2022     Lipid Panel ( most recent) Lipid Panel     Component Value Date/Time   CHOL 147 07/23/2024 0930   TRIG 71 07/23/2024 0930   HDL 64 07/23/2024 0930   CHOLHDL 2.3 07/23/2024 0930   CHOLHDL 3.0 09/23/2019 0746   VLDL 25 09/17/2017 0843   LDLCALC 69 07/23/2024 0930   LDLCALC 47 09/23/2019 0746   LABVLDL 14 07/23/2024 0930      Lab Results   Component Value Date   TSH 4.710 (H) 07/23/2024   TSH 0.654 08/20/2023   TSH 0.682 08/20/2023   TSH 3.090 08/14/2022   TSH 0.650 12/22/2021   TSH 4.980 (H) 04/26/2021   TSH 0.322 (L) 04/09/2021   TSH 1.240 09/22/2020   TSH 7.26 (H) 09/23/2019   TSH 2.643 09/17/2017   FREET4 1.40 07/23/2024   FREET4 1.80 (H) 08/20/2023   FREET4 1.81 (H) 08/20/2023   FREET4 1.41 08/14/2022   FREET4 1.74 12/22/2021   FREET4 1.39 04/26/2021   FREET4 1.40 09/22/2020      Assessment & Plan:   1. Poorly controlled type 2 diabetes mellitus (HCC)  - DALAN COWGER has currently uncontrolled symptomatic type 2 DM since  74 years of age.  Mr. Raygoza wears a CGM device AGP shows 84% time in range, 15% level 1 hyperglycemia.  He has no hypoglycemia.  His point-of-care A1c is 7.4% increasing from 6.7% during his last visit.   - I had a long discussion with him about the progressive nature of diabetes and the pathology behind its complications. -He does not report gross complications from diabetes, however, he remains at a high risk for more acute and chronic complications which include  CAD, CVA, CKD, retinopathy, and neuropathy. These are all discussed in detail with him.  - I have counseled him on diet  and weight management  by adopting a carbohydrate restricted/protein rich diet. Patient is encouraged to switch to  unprocessed or minimally processed     complex starch and increased protein intake (animal or plant source), fruits, and vegetables. -  he is advised to stick to a routine mealtimes to eat 3 meals  a day and avoid unnecessary snacks ( to snack only to correct hypoglycemia).  In light of his metabolic dysfunction with multiple chronic conditions , he remains a good candidate for lifestyle medicine. -He is encouraged to stay engaged in lifestyle medicine.  - he acknowledges that there is a room for improvement in his food and drink choices. - Suggestion is made for him to avoid simple  carbohydrates  from his diet including Cakes, Sweet Desserts, Ice Cream, Soda (diet and regular), Sweet Tea, Candies, Chips, Cookies, Store Bought Juices, Alcohol , Artificial Sweeteners,  Coffee Creamer, and Sugar-free Products, Lemonade. This will help patient to have more stable blood glucose profile and potentially avoid unintended weight gain.   - I have approached him with the following individualized plan to manage  his diabetes and patient agrees:   - In light of his presentation with target glycemic profile, he will not need intervention with insulin .  He may benefit from a higher dose of Jardiance .  I discussed and increase his Jardiance  to 25 mg p.o. daily at breakfast.  He is also advised to continue metformin  1000 mg p.o. twice daily.  -He did not tolerate GLP-1 receptor agonist in the past.   -He is advised to continue to use his CGM.   He is advised to call clinic for blood glucose readings greater than 180 milligrams per DL weekly average.   - Specific targets for  A1c;  LDL, HDL,  and Triglycerides were discussed with the patient.  2) Blood Pressure /Hypertension:  His blood pressure is controlled to target.  He is currently on Norvasc  10 mg p.o. daily and lisinopril  40 mg p.o. daily at breakfast.  He is advised to take both of his medications at breakfast .   3) Lipids/Hyperlipidemia:   Review of his recent lipid panel showed controlled LDL 69.   This is considered target control.  He is advised to continue his atorvastatin  10 mg p.o. nightly. Side effects and precautions discussed with him.  4)  Weight/Diet:  Body mass index is 25.31 kg/m.  -    This patient has achieved adequate weight loss with lifestyle medicine.  he is not a candidate for major weight loss.   I discussed with him the fact that loss of 5 - 10% of his  current body weight will have the most impact on his diabetes management.  Exercise, and detailed carbohydrates information provided  -  detailed on discharge  instructions.  5) hypothyroidism-the circumstance of his diagnosis are not available to review.  His most recent thyroid  function test profile is consistent with appropriate replacement.  He is advised to continue  levothyroxine   112 mcg p.o. daily before breakfast.   - We discussed about the correct intake of his thyroid  hormone, on empty stomach at fasting, with water, separated by at least 30 minutes from breakfast and other medications,  and separated by more than 4 hours from calcium , iron, multivitamins, acid reflux medications (PPIs). -Patient is made aware of the fact that thyroid  hormone replacement is  needed for life, dose to be adjusted by periodic monitoring of thyroid  function tests.   6) Chronic Care/Health Maintenance:  -he  is on ACEI/ARB and Statin medications and  is encouraged to initiate and continue to follow up with Ophthalmology, Dentist,  Podiatrist at least yearly or according to recommendations, and advised to   stay away from smoking. I have recommended yearly flu vaccine and pneumonia vaccine at least every 5 years; moderate intensity exercise for up to 150 minutes weekly; and  sleep for at least 7 hours a day.  - he is  advised to maintain close follow up with Tobie Suzzane POUR, MD for primary care needs, as well as his other providers for optimal and coordinated care.   I spent  26  minutes in the care of the patient today including review of labs from CMP, Lipids, Thyroid  Function, Hematology (current and previous including abstractions from other facilities); face-to-face time discussing  his blood glucose readings/logs, discussing hypoglycemia and hyperglycemia episodes and symptoms, medications doses, his options of short and long term treatment based on the latest standards of care / guidelines;  discussion about incorporating lifestyle medicine;  and documenting the encounter. Risk reduction counseling performed per USPSTF guidelines to reduce cardiovascular risk  factors.     Please refer to Patient Instructions for Blood Glucose Monitoring and Insulin /Medications Dosing Guide  in media tab for additional information. Please  also refer to  Patient Self Inventory in the Media  tab for reviewed elements of pertinent patient history.  Dorn VEAR Boas participated in the discussions, expressed understanding, and voiced agreement with the above plans.  All questions were answered to his satisfaction. he is encouraged to contact clinic should he have any questions or concerns prior to his return visit. Dear Patient: Feel free to review your progress notes.  If you are reviewing this progress note and have questions about the meaning of /or medical terms being used, please make a note and address it at your next follow-up appointment.  Medical notes are meant to be a communication tool between medical professionals and require medical terms to be used for efficiency and insurance approval.   Follow up plan: - Return in about 4 months (around 11/26/2024) for Bring Meter/CGM Device/Logs- A1c in Office.  Ranny Earl, MD [facility] [address] [place], [place] [address] Phone: [phone]  Fax: [phone]    07/29/2024, 11:04 AM  This note was partially dictated with voice recognition software. Similar sounding words can be transcribed inadequately or may not  be corrected upon review.   "

## 2024-07-30 ENCOUNTER — Other Ambulatory Visit: Payer: Self-pay

## 2024-07-30 DIAGNOSIS — I10 Essential (primary) hypertension: Secondary | ICD-10-CM

## 2024-07-30 MED ORDER — AMLODIPINE BESYLATE 10 MG PO TABS: 10.0000 mg | ORAL_TABLET | Freq: Every day | ORAL | 0 refills | Status: AC

## 2024-11-27 ENCOUNTER — Ambulatory Visit: Admitting: "Endocrinology

## 2025-03-23 ENCOUNTER — Ambulatory Visit
